# Patient Record
Sex: Female | Born: 1935 | Race: White | Hispanic: No | Smoking: Former smoker
Health system: Southern US, Community
[De-identification: ages and names within clinical notes are randomized; demographics above are authoritative.]

## PROBLEM LIST (undated history)

## (undated) DIAGNOSIS — M869 Osteomyelitis, unspecified: Secondary | ICD-10-CM

## (undated) DIAGNOSIS — J449 Chronic obstructive pulmonary disease, unspecified: Secondary | ICD-10-CM

## (undated) DIAGNOSIS — R918 Other nonspecific abnormal finding of lung field: Secondary | ICD-10-CM

## (undated) DIAGNOSIS — Z8669 Personal history of other diseases of the nervous system and sense organs: Secondary | ICD-10-CM

## (undated) DIAGNOSIS — E538 Deficiency of other specified B group vitamins: Secondary | ICD-10-CM

## (undated) DIAGNOSIS — I10 Essential (primary) hypertension: Secondary | ICD-10-CM

## (undated) DIAGNOSIS — E785 Hyperlipidemia, unspecified: Secondary | ICD-10-CM

## (undated) HISTORY — DX: Essential (primary) hypertension: I10

## (undated) HISTORY — DX: Deficiency of other specified B group vitamins: E53.8

## (undated) HISTORY — DX: Chronic obstructive pulmonary disease, unspecified: J44.9

## (undated) HISTORY — DX: Personal history of other diseases of the nervous system and sense organs: Z86.69

## (undated) HISTORY — PX: ABDOMINAL HYSTERECTOMY: SHX81

## (undated) HISTORY — DX: Hyperlipidemia, unspecified: E78.5

## (undated) HISTORY — DX: Other nonspecific abnormal finding of lung field: R91.8

## (undated) HISTORY — PX: CHOLECYSTECTOMY: SHX55

---

## 1999-06-25 HISTORY — PX: SPINE SURGERY: SHX786

## 2004-05-19 LAB — HM COLONOSCOPY: HM Colonoscopy: NORMAL

## 2008-08-11 ENCOUNTER — Ambulatory Visit: Payer: Self-pay | Admitting: Internal Medicine

## 2009-09-22 DIAGNOSIS — R918 Other nonspecific abnormal finding of lung field: Secondary | ICD-10-CM

## 2009-09-22 HISTORY — DX: Other nonspecific abnormal finding of lung field: R91.8

## 2010-02-12 LAB — HM DIABETES EYE EXAM

## 2010-06-24 HISTORY — PX: LUNG BIOPSY: SHX232

## 2010-09-23 ENCOUNTER — Ambulatory Visit: Payer: Self-pay | Admitting: Internal Medicine

## 2010-10-05 ENCOUNTER — Inpatient Hospital Stay: Payer: Self-pay | Admitting: Internal Medicine

## 2010-10-18 ENCOUNTER — Ambulatory Visit: Payer: Self-pay | Admitting: Cardiothoracic Surgery

## 2010-10-23 ENCOUNTER — Ambulatory Visit: Payer: Self-pay | Admitting: Cardiothoracic Surgery

## 2010-10-23 ENCOUNTER — Ambulatory Visit: Payer: Self-pay | Admitting: Internal Medicine

## 2010-10-24 ENCOUNTER — Ambulatory Visit: Payer: Self-pay | Admitting: Cardiothoracic Surgery

## 2010-11-23 ENCOUNTER — Ambulatory Visit: Payer: Self-pay | Admitting: Cardiothoracic Surgery

## 2011-02-15 ENCOUNTER — Ambulatory Visit: Payer: Self-pay | Admitting: Cardiothoracic Surgery

## 2011-03-06 ENCOUNTER — Other Ambulatory Visit: Payer: Self-pay | Admitting: Internal Medicine

## 2011-03-07 ENCOUNTER — Ambulatory Visit: Payer: Self-pay | Admitting: Cardiothoracic Surgery

## 2011-03-07 MED ORDER — METFORMIN HCL 850 MG PO TABS: 850.0000 mg | ORAL_TABLET | Freq: Two times a day (BID) | ORAL | Status: DC

## 2011-03-07 MED ORDER — ROSUVASTATIN CALCIUM 20 MG PO TABS: 20.0000 mg | ORAL_TABLET | Freq: Every day | ORAL | Status: DC

## 2011-03-07 MED ORDER — LOSARTAN POTASSIUM-HCTZ 100-25 MG PO TABS: 1.0000 | ORAL_TABLET | Freq: Every day | ORAL | Status: DC

## 2011-03-25 ENCOUNTER — Ambulatory Visit: Payer: Self-pay | Admitting: Cardiothoracic Surgery

## 2011-04-12 ENCOUNTER — Other Ambulatory Visit: Payer: Self-pay | Admitting: Internal Medicine

## 2011-04-12 MED ORDER — LISINOPRIL 40 MG PO TABS: 40.0000 mg | ORAL_TABLET | Freq: Every day | ORAL | Status: DC

## 2011-05-13 ENCOUNTER — Ambulatory Visit: Payer: Self-pay | Admitting: Internal Medicine

## 2011-06-06 ENCOUNTER — Ambulatory Visit: Payer: Self-pay | Admitting: Cardiothoracic Surgery

## 2011-06-11 ENCOUNTER — Telehealth: Payer: Self-pay | Admitting: *Deleted

## 2011-06-11 ENCOUNTER — Ambulatory Visit (INDEPENDENT_AMBULATORY_CARE_PROVIDER_SITE_OTHER): Payer: PRIVATE HEALTH INSURANCE | Admitting: Internal Medicine

## 2011-06-11 ENCOUNTER — Encounter: Payer: Self-pay | Admitting: Internal Medicine

## 2011-06-11 VITALS — BP 130/80 | HR 68 | Temp 98.3°F | Ht 64.5 in | Wt 167.5 lb

## 2011-06-11 DIAGNOSIS — R918 Other nonspecific abnormal finding of lung field: Secondary | ICD-10-CM | POA: Insufficient documentation

## 2011-06-11 DIAGNOSIS — R5383 Other fatigue: Secondary | ICD-10-CM

## 2011-06-11 DIAGNOSIS — E1121 Type 2 diabetes mellitus with diabetic nephropathy: Secondary | ICD-10-CM | POA: Insufficient documentation

## 2011-06-11 DIAGNOSIS — I10 Essential (primary) hypertension: Secondary | ICD-10-CM | POA: Insufficient documentation

## 2011-06-11 DIAGNOSIS — R222 Localized swelling, mass and lump, trunk: Secondary | ICD-10-CM

## 2011-06-11 DIAGNOSIS — E119 Type 2 diabetes mellitus without complications: Secondary | ICD-10-CM

## 2011-06-11 DIAGNOSIS — E538 Deficiency of other specified B group vitamins: Secondary | ICD-10-CM | POA: Insufficient documentation

## 2011-06-11 DIAGNOSIS — E1165 Type 2 diabetes mellitus with hyperglycemia: Secondary | ICD-10-CM

## 2011-06-11 DIAGNOSIS — R5381 Other malaise: Secondary | ICD-10-CM

## 2011-06-11 DIAGNOSIS — F17200 Nicotine dependence, unspecified, uncomplicated: Secondary | ICD-10-CM

## 2011-06-11 DIAGNOSIS — J449 Chronic obstructive pulmonary disease, unspecified: Secondary | ICD-10-CM | POA: Insufficient documentation

## 2011-06-11 DIAGNOSIS — R32 Unspecified urinary incontinence: Secondary | ICD-10-CM

## 2011-06-11 DIAGNOSIS — Z87891 Personal history of nicotine dependence: Secondary | ICD-10-CM | POA: Insufficient documentation

## 2011-06-11 DIAGNOSIS — E785 Hyperlipidemia, unspecified: Secondary | ICD-10-CM | POA: Insufficient documentation

## 2011-06-11 DIAGNOSIS — Z23 Encounter for immunization: Secondary | ICD-10-CM

## 2011-06-11 DIAGNOSIS — Z720 Tobacco use: Secondary | ICD-10-CM

## 2011-06-11 DIAGNOSIS — E559 Vitamin D deficiency, unspecified: Secondary | ICD-10-CM

## 2011-06-11 MED ORDER — HYDROCOD POLST-CHLORPHEN POLST 10-8 MG/5ML PO LQCR
5.0000 mL | Freq: Two times a day (BID) | ORAL | Status: DC | PRN
Start: 2011-06-11 — End: 2011-06-12

## 2011-06-11 MED ORDER — DISPOSABLE BRIEF LARGE MISC: 1.0000 "application " | Freq: Two times a day (BID) | Status: DC

## 2011-06-11 MED ORDER — CYANOCOBALAMIN 1000 MCG/ML IJ SOLN
1000.0000 ug | Freq: Once | INTRAMUSCULAR | Status: AC
  Administered 2011-06-11: 1000 ug via INTRAMUSCULAR

## 2011-06-11 NOTE — Patient Instructions (Signed)
We are checking your blood work to see why you are so tired and to see how your diabetes is doing.  We will call you with the results.  Return in 3 months

## 2011-06-11 NOTE — Progress Notes (Signed)
Subjective:    Patient ID: Monica [name], female    DOB: 1930/03/25, 77 y.o.   MRN: [id number]  HPI [name] is a 77 yo white female with a history of DM2, tobacco abuse,  Lung mass with weight loss, hospitalized April 2012 for presumed lung CA with inconclusive biopsy last see June 2012 for hospital follow up.  She presents with fatigue.  She has a history of depression,  B12 deficiency , has not not taking her shots anymore since my office relocated.  She takes metformin and glipizide for diabetes and denies any changes in control, specifically no recent lows.   Her husband diagnosed with bladder cancer several months ago and she has been caring for him including bathing him, waiting on him hand and foot and resenting him for it.  She has had a cough for about two weeks without fevers or productive sputum.     Past Medical History  Diagnosis Date  . Diabetes mellitus   . Hyperlipidemia   . Hypertension   . COPD (chronic obstructive pulmonary disease)   . B12 deficiency   . H/O: Bell's palsy   . Lung mass April 2011    s/p biopsy, no cancer cells found, Repeat CT Dec 2012 unchanged   Current Outpatient Prescriptions on File Prior to Visit  Medication Sig Dispense Refill  . lisinopril (PRINIVIL,ZESTRIL) 40 MG tablet Take 1 tablet (40 mg total) by mouth daily.  30 tablet  3  . losartan-hydrochlorothiazide (HYZAAR) 100-25 MG per tablet Take 1 tablet by mouth daily.  30 tablet  3  . metFORMIN (GLUCOPHAGE) 850 MG tablet Take 1 tablet (850 mg total) by mouth 2 (two) times daily with a meal.  60 tablet  3  . rosuvastatin (CRESTOR) 20 MG tablet Take 1 tablet (20 mg total) by mouth at bedtime.  30 tablet  3   No current facility-administered medications on file prior to visit.    Review of Systems  Constitutional: Negative for fever, chills and unexpected weight change.  HENT: Negative for hearing loss, ear pain, nosebleeds, congestion, sore throat, facial swelling, rhinorrhea, sneezing, mouth  sores, trouble swallowing, neck pain, neck stiffness, voice change, postnasal drip, sinus pressure, tinnitus and ear discharge.   Eyes: Negative for pain, discharge, redness and visual disturbance.  Respiratory: Positive for cough. Negative for chest tightness, shortness of breath, wheezing and stridor.   Cardiovascular: Negative for chest pain, palpitations and leg swelling.  Musculoskeletal: Negative for myalgias and arthralgias.  Skin: Negative for color change and rash.  Neurological: Negative for dizziness, weakness, light-headedness and headaches.  Hematological: Negative for adenopathy.       Objective:   Physical Exam  Constitutional: She is oriented to person, place, and time. She appears well-developed and well-nourished.  HENT:  Mouth/Throat: Oropharynx is clear and moist.  Eyes: EOM are normal. Pupils are equal, round, and reactive to light. No scleral icterus.  Neck: Normal range of motion. Neck supple. No JVD present. No thyromegaly present.  Cardiovascular: Normal rate, regular rhythm, normal heart sounds and intact distal pulses.   Pulmonary/Chest: Effort normal and breath sounds normal.  Abdominal: Soft. Bowel sounds are normal. She exhibits no mass. There is no tenderness.  Musculoskeletal: Normal range of motion. She exhibits no edema.  Lymphadenopathy:    She has no cervical adenopathy.  Neurological: She is alert and oriented to person, place, and time.  Skin: Skin is warm and dry.  Psychiatric: She has a normal mood and affect.  Assessment & Plan:

## 2011-06-11 NOTE — Assessment & Plan Note (Signed)
She has not had lipids or labs since April 2012.  Repeat due, goal LDL 70

## 2011-06-11 NOTE — Assessment & Plan Note (Signed)
She stopped smoking for a few months after her presumed diagnosis of lung CA ut is now smoking again, citing her home stressors as the cause. Counselling and encouragement to quit given.

## 2011-06-11 NOTE — Assessment & Plan Note (Signed)
Over due by 1 year. For eye exam.  Will refer to [facility]

## 2011-06-11 NOTE — Assessment & Plan Note (Signed)
Currently well controlled. No changes today

## 2011-06-11 NOTE — Telephone Encounter (Signed)
Tussionex.  5 ml every 12 hours prn cough     200 ml 1 refill

## 2011-06-11 NOTE — Telephone Encounter (Signed)
Called in RX, pt was informed at OV to check with pharm

## 2011-06-11 NOTE — Assessment & Plan Note (Signed)
With positive PET scan ,  Negative biopsy April 2012.  Has been having serial CT scans, last one  on Dec 13th  by Dr. Genevive Bi,  It was unchanged so she is back to every 6 months.

## 2011-06-11 NOTE — Telephone Encounter (Signed)
Patient requesting RF of cough med given when she was last d/c'd from the hospital. She does not remember name, please advise.

## 2011-06-12 ENCOUNTER — Telehealth: Payer: Self-pay | Admitting: Internal Medicine

## 2011-06-12 LAB — CBC WITH DIFFERENTIAL/PLATELET
Basophils Relative: 0.3 % (ref 0.0–3.0)
Eosinophils Absolute: 0.3 10*3/uL (ref 0.0–0.7)
HCT: 41.6 % (ref 36.0–46.0)
Lymphs Abs: 1.7 10*3/uL (ref 0.7–4.0)
MCHC: 33.1 g/dL (ref 30.0–36.0)
MCV: 86.9 fl (ref 78.0–100.0)
Monocytes Absolute: 0.5 10*3/uL (ref 0.1–1.0)
Neutro Abs: 5.3 10*3/uL (ref 1.4–7.7)
Neutrophils Relative %: 67.9 % (ref 43.0–77.0)
RBC: 4.79 Mil/uL (ref 3.87–5.11)

## 2011-06-12 LAB — FOLATE: Folate: 12.1 ng/mL (ref >5.9)

## 2011-06-12 LAB — COMPREHENSIVE METABOLIC PANEL
AST: 19 U/L (ref 0–37)
Alkaline Phosphatase: 59 U/L (ref 39–117)
BUN: 24 mg/dL — ABNORMAL HIGH (ref 6–23)
Creatinine, Ser: 1.4 mg/dL — ABNORMAL HIGH (ref 0.4–1.2)
Glucose, Bld: 151 mg/dL — ABNORMAL HIGH (ref 70–99)
Potassium: 4.7 mEq/L (ref 3.5–5.1)
Total Bilirubin: 0.6 mg/dL (ref 0.3–1.2)

## 2011-06-12 LAB — VITAMIN B12: Vitamin B-12: 303 pg/mL (ref 211–911)

## 2011-06-12 MED ORDER — HYDROCODONE-ACETAMINOPHEN 5-500 MG PO TABS: 1.0000 | ORAL_TABLET | Freq: Four times a day (QID) | ORAL | Status: DC | PRN

## 2011-06-12 NOTE — Telephone Encounter (Signed)
Patient can't afford to pay 48.00 for cough syrup could you prescribe something else.

## 2011-06-12 NOTE — Telephone Encounter (Signed)
Please call ms Knoles in hydrocodone/apap 5/500 one tablet every 6 hours as needed for cough   #30 no refills to take the place of the cough syrup

## 2011-06-12 NOTE — Telephone Encounter (Signed)
Patient advised as instructed via telephone.  Rx sent to [facility].

## 2011-06-13 DIAGNOSIS — R32 Unspecified urinary incontinence: Secondary | ICD-10-CM | POA: Insufficient documentation

## 2011-06-13 NOTE — Assessment & Plan Note (Signed)
She continues to require adult diapers to manage urge/stress incontinence that did not respond to medical therapy.Marland Kitchen

## 2011-06-13 NOTE — Progress Notes (Signed)
  Subjective:    Patient ID: Monica [name], female    DOB: [birth year], 77 y.o.   MRN: [id number]  HPI    Review of Systems     Objective:   Physical Exam        Assessment & Plan:   Diabetes mellitus type 2, uncontrolled Over due by 1 year. For eye exam.  Will refer to  eye   Lung mass With positive PET scan ,  Negative biopsy April 2012.  Has been having serial CT scans, last one  on Dec 13th  by Dr. Genevive Bi,  It was unchanged so she is back to every 6 months.   Hypertension Currently well controlled. No changes today  Hyperlipidemia She has not had lipids or labs since April 2012.  Repeat due, goal LDL 70  Tobacco abuse disorder She stopped smoking for a few months after her presumed diagnosis of lung CA ut is now smoking again, citing her home stressors as the cause. Counselling and encouragement to quit given.   Incontinence of urine She continues to require adult diapers to manage urge/stress incontinence that did not respond to medical therapy.Marland Kitchen     Updated Medication List Outpatient Encounter Prescriptions as of 06/11/2011  Medication Sig Dispense Refill  . acetaminophen (TYLENOL) 500 MG tablet Take 500 mg by mouth as needed.        Marland Kitchen atenolol (TENORMIN) 50 MG tablet Take 50 mg by mouth 2 (two) times daily.        Marland Kitchen glipiZIDE (GLUCOTROL) 5 MG tablet Take 1/2 by mouth every morning before breakfast and before supper       . lisinopril (PRINIVIL,ZESTRIL) 40 MG tablet Take 1 tablet (40 mg total) by mouth daily.  30 tablet  3  . losartan-hydrochlorothiazide (HYZAAR) 100-25 MG per tablet Take 1 tablet by mouth daily.  30 tablet  3  . metFORMIN (GLUCOPHAGE) 850 MG tablet Take 1 tablet (850 mg total) by mouth 2 (two) times daily with a meal.  60 tablet  3  . rosuvastatin (CRESTOR) 20 MG tablet Take 1 tablet (20 mg total) by mouth at bedtime.  30 tablet  3  . sertraline (ZOLOFT) 50 MG tablet Take 50 mg by mouth daily.        . Incontinence Supply Disposable  (DISPOSABLE BRIEF LARGE) MISC 1 application by Does not apply route 2 (two) times daily.  36 each  12  . cyanocobalamin ((VITAMIN B-12)) injection 1,000 mcg

## 2011-06-25 ENCOUNTER — Ambulatory Visit: Payer: Self-pay | Admitting: Cardiothoracic Surgery

## 2011-06-28 ENCOUNTER — Ambulatory Visit (INDEPENDENT_AMBULATORY_CARE_PROVIDER_SITE_OTHER): Payer: 59 | Admitting: *Deleted

## 2011-06-28 DIAGNOSIS — E538 Deficiency of other specified B group vitamins: Secondary | ICD-10-CM

## 2011-06-28 MED ORDER — CYANOCOBALAMIN 1000 MCG/ML IJ SOLN: 1000.0000 ug | Freq: Once | INTRAMUSCULAR | Status: DC

## 2011-07-01 ENCOUNTER — Encounter: Payer: Self-pay | Admitting: Internal Medicine

## 2011-07-08 ENCOUNTER — Ambulatory Visit (INDEPENDENT_AMBULATORY_CARE_PROVIDER_SITE_OTHER): Payer: 59 | Admitting: Internal Medicine

## 2011-07-08 ENCOUNTER — Encounter: Payer: Self-pay | Admitting: Internal Medicine

## 2011-07-08 VITALS — BP 130/40 | HR 68 | Temp 98.1°F | Wt 163.0 lb

## 2011-07-08 DIAGNOSIS — Z716 Tobacco abuse counseling: Secondary | ICD-10-CM | POA: Insufficient documentation

## 2011-07-08 DIAGNOSIS — J441 Chronic obstructive pulmonary disease with (acute) exacerbation: Secondary | ICD-10-CM

## 2011-07-08 DIAGNOSIS — J40 Bronchitis, not specified as acute or chronic: Secondary | ICD-10-CM

## 2011-07-08 DIAGNOSIS — Z7189 Other specified counseling: Secondary | ICD-10-CM

## 2011-07-08 MED ORDER — AMOXICILLIN-POT CLAVULANATE 875-125 MG PO TABS: 1.0000 | ORAL_TABLET | Freq: Two times a day (BID) | ORAL | Status: AC

## 2011-07-08 MED ORDER — PREDNISONE (PAK) 10 MG PO TABS: ORAL_TABLET | ORAL | Status: AC

## 2011-07-08 NOTE — Patient Instructions (Addendum)
Get generic mucinex (guaifenesin) 600 mg twice daily .  Take with a full glass of water.  Augmentin 875/125  one table  two times daily with food for 7 days ( this is your )  Prednisone taper  :  Take 6 mg on day 1 all at once,  Then one tablet less every day until gone .(this is for wheezing)  Hydrocodone tablet is to be taken at bedtime for cough.

## 2011-07-08 NOTE — Progress Notes (Signed)
Subjective:    Patient ID: Monica [name], female    DOB: 1929-11-13, 78 y.o.   MRN: [id number]  HPI  78 yr old white female with a history of prior necrotic lung mass, negative biopsy,  Presumed secondary to pneumonia, presents with cough and runny nose for one week .  Reports malaise without myalgias or high fevers.  She has a history of  tobacco abuse and diabetes.  Past Medical History  Diagnosis Date  . Diabetes mellitus   . Hyperlipidemia   . Hypertension   . COPD (chronic obstructive pulmonary disease)   . B12 deficiency   . H/O: Bell's palsy   . Lung mass April 2011    s/p biopsy, no cancer cells found, Repeat CT Dec 2012 unchanged    Current Outpatient Prescriptions on File Prior to Visit  Medication Sig Dispense Refill  . acetaminophen (TYLENOL) 500 MG tablet Take 500 mg by mouth as needed.        Marland Kitchen atenolol (TENORMIN) 50 MG tablet Take 50 mg by mouth 2 (two) times daily.        Marland Kitchen glipiZIDE (GLUCOTROL) 5 MG tablet Take 1/2 by mouth every morning before breakfast and before supper       . Incontinence Supply Disposable (DISPOSABLE BRIEF LARGE) MISC 1 application by Does not apply route 2 (two) times daily.  36 each  12  . lisinopril (PRINIVIL,ZESTRIL) 40 MG tablet Take 1 tablet (40 mg total) by mouth daily.  30 tablet  3  . losartan-hydrochlorothiazide (HYZAAR) 100-25 MG per tablet Take 1 tablet by mouth daily.  30 tablet  3  . metFORMIN (GLUCOPHAGE) 850 MG tablet Take 1 tablet (850 mg total) by mouth 2 (two) times daily with a meal.  60 tablet  3  . rosuvastatin (CRESTOR) 20 MG tablet Take 1 tablet (20 mg total) by mouth at bedtime.  30 tablet  3  . sertraline (ZOLOFT) 50 MG tablet Take 50 mg by mouth daily.         Current Facility-Administered Medications on File Prior to Visit  Medication Dose Route Frequency Provider Last Rate Last Dose  . cyanocobalamin ((VITAMIN B-12)) injection 1,000 mcg  1,000 mcg Intramuscular Once Deborra Medina, MD       Review of Systems    Constitutional: Positive for fever and fatigue. Negative for chills and unexpected weight change.  HENT: Negative for hearing loss, ear pain, nosebleeds, congestion, sore throat, facial swelling, rhinorrhea, sneezing, mouth sores, trouble swallowing, neck pain, neck stiffness, voice change, postnasal drip, sinus pressure, tinnitus and ear discharge.   Eyes: Negative for pain, discharge, redness and visual disturbance.  Respiratory: Positive for cough and wheezing. Negative for chest tightness, shortness of breath and stridor.   Cardiovascular: Negative for chest pain, palpitations and leg swelling.  Musculoskeletal: Negative for myalgias and arthralgias.  Skin: Negative for color change and rash.  Neurological: Negative for dizziness, weakness, light-headedness and headaches.  Hematological: Negative for adenopathy.       Objective:   Physical Exam  Constitutional: She is oriented to person, place, and time. She appears well-developed and well-nourished.  HENT:  Mouth/Throat: Oropharynx is clear and moist.  Eyes: EOM are normal. Pupils are equal, round, and reactive to light. No scleral icterus.  Neck: Normal range of motion. Neck supple. No JVD present. No thyromegaly present.  Cardiovascular: Normal rate, regular rhythm, normal heart sounds and intact distal pulses.   Pulmonary/Chest: Effort normal. She has wheezes.  Abdominal: Soft. Bowel sounds are normal.  She exhibits no mass. There is no tenderness.  Musculoskeletal: Normal range of motion. She exhibits no edema.  Lymphadenopathy:    She has no cervical adenopathy.  Neurological: She is alert and oriented to person, place, and time.  Skin: Skin is warm and dry.  Psychiatric: She has a normal mood and affect.      Assessment & Plan:

## 2011-07-08 NOTE — Assessment & Plan Note (Signed)
Spent less than 3 minutes reminding patient of her close brush with cancer last year and advised  Her to quit.

## 2011-07-08 NOTE — Assessment & Plan Note (Signed)
Mild, with minimal wheezing on exam,  Steroids,  abx,  Return in one week if no improvement for cxr

## 2011-07-12 ENCOUNTER — Telehealth: Payer: Self-pay | Admitting: Internal Medicine

## 2011-07-12 NOTE — Telephone Encounter (Signed)
Advised pt

## 2011-07-12 NOTE — Telephone Encounter (Signed)
More likely related to the illness she is recovering from.  Tell her to rest

## 2011-07-12 NOTE — Telephone Encounter (Signed)
Patient picking up a few things around the house and broke out in a sweat ,wandering if it has anything to do with the new medication she it taking.

## 2011-07-22 ENCOUNTER — Other Ambulatory Visit: Payer: Self-pay | Admitting: Internal Medicine

## 2011-07-30 ENCOUNTER — Ambulatory Visit (INDEPENDENT_AMBULATORY_CARE_PROVIDER_SITE_OTHER): Payer: 59 | Admitting: *Deleted

## 2011-07-30 DIAGNOSIS — E538 Deficiency of other specified B group vitamins: Secondary | ICD-10-CM

## 2011-07-30 MED ORDER — CYANOCOBALAMIN 1000 MCG/ML IJ SOLN
1000.0000 ug | Freq: Once | INTRAMUSCULAR | Status: AC
  Administered 2013-10-18: 1000 ug via INTRAMUSCULAR

## 2011-08-19 ENCOUNTER — Other Ambulatory Visit: Payer: Self-pay | Admitting: Internal Medicine

## 2011-08-28 ENCOUNTER — Encounter: Payer: Self-pay | Admitting: Internal Medicine

## 2011-09-11 ENCOUNTER — Ambulatory Visit: Payer: PRIVATE HEALTH INSURANCE | Admitting: Internal Medicine

## 2011-09-12 ENCOUNTER — Other Ambulatory Visit: Payer: Self-pay | Admitting: Internal Medicine

## 2011-09-17 ENCOUNTER — Encounter: Payer: Self-pay | Admitting: Internal Medicine

## 2011-09-17 ENCOUNTER — Ambulatory Visit (INDEPENDENT_AMBULATORY_CARE_PROVIDER_SITE_OTHER): Payer: 59 | Admitting: Internal Medicine

## 2011-09-17 VITALS — BP 150/74 | HR 72 | Temp 98.1°F | Resp 18 | Wt 171.5 lb

## 2011-09-17 DIAGNOSIS — E1165 Type 2 diabetes mellitus with hyperglycemia: Secondary | ICD-10-CM

## 2011-09-17 DIAGNOSIS — G8929 Other chronic pain: Secondary | ICD-10-CM

## 2011-09-17 DIAGNOSIS — E538 Deficiency of other specified B group vitamins: Secondary | ICD-10-CM

## 2011-09-17 DIAGNOSIS — Z7189 Other specified counseling: Secondary | ICD-10-CM

## 2011-09-17 DIAGNOSIS — E119 Type 2 diabetes mellitus without complications: Secondary | ICD-10-CM

## 2011-09-17 DIAGNOSIS — IMO0001 Reserved for inherently not codable concepts without codable children: Secondary | ICD-10-CM

## 2011-09-17 DIAGNOSIS — Z716 Tobacco abuse counseling: Secondary | ICD-10-CM

## 2011-09-17 DIAGNOSIS — M25561 Pain in right knee: Secondary | ICD-10-CM

## 2011-09-17 DIAGNOSIS — M25562 Pain in left knee: Secondary | ICD-10-CM

## 2011-09-17 DIAGNOSIS — M25569 Pain in unspecified knee: Secondary | ICD-10-CM

## 2011-09-17 MED ORDER — ROSUVASTATIN CALCIUM 20 MG PO TABS: 20.0000 mg | ORAL_TABLET | Freq: Every day | ORAL | Status: DC

## 2011-09-17 MED ORDER — LOSARTAN POTASSIUM-HCTZ 100-25 MG PO TABS: 1.0000 | ORAL_TABLET | Freq: Every day | ORAL | Status: DC

## 2011-09-17 MED ORDER — LISINOPRIL 40 MG PO TABS: 40.0000 mg | ORAL_TABLET | Freq: Every day | ORAL | Status: DC

## 2011-09-17 MED ORDER — SERTRALINE HCL 50 MG PO TABS: 50.0000 mg | ORAL_TABLET | Freq: Every day | ORAL | Status: DC

## 2011-09-17 MED ORDER — TRAMADOL HCL 50 MG PO TABS: 50.0000 mg | ORAL_TABLET | Freq: Three times a day (TID) | ORAL | Status: AC | PRN

## 2011-09-17 NOTE — Assessment & Plan Note (Signed)
Shot given today

## 2011-09-17 NOTE — Patient Instructions (Addendum)
I am prescribing a tramadol a pain medication ., for your knees,  Take up to three times daily   You can add ibuprofen 400 mg twice daily  And tylenol 1000 mg twice daily  If needed for pain control.    Please go get x rays of your knees soon.  Check your blood sugars once a day at different times adn keep a diary of the time and whether you had eaten or not before you took it   Send diary in 2 weeks to me or call my nurse.

## 2011-09-17 NOTE — Progress Notes (Signed)
Patient ID: Monica [name], female   DOB: [birth year], 78 y.o.   MRN: [id number]   Patient Active Problem List  Diagnoses  . Diabetes mellitus type 2, uncontrolled  . Diabetes mellitus  . Hyperlipidemia  . Hypertension  . COPD (chronic obstructive pulmonary disease)  . B12 deficiency  . Lung mass  . Tobacco abuse disorder  . Incontinence of urine  . COPD exacerbation  . Tobacco abuse counseling  . Bilateral chronic knee pain    Subjective:  CC:   Chief Complaint  Patient presents with  . Follow-up    HPI:   Monica [name] a 78 y.o. female who presents with bilateral knee pain which has been for the last 2-3 years but has become more persistent lately and is now preventing her from doing housework housework without moderate pain .  She has no history of trauma or prior surgeries. She notes that her pain increases after inactivity and describes the pain as aching and stiffness she has noticed some bilateral swelling but no erythema.       Past Medical History  Diagnosis Date  . Diabetes mellitus   . Hyperlipidemia   . Hypertension   . COPD (chronic obstructive pulmonary disease)   . B12 deficiency   . H/O: Bell's palsy   . Lung mass April 2011    s/p biopsy, no cancer cells found, Repeat CT Dec 2012 unchanged    Past Surgical History  Procedure Date  . Cholecystectomy   . Spine surgery 2001    Lumbar  . Abdominal hysterectomy   . Lung biopsy 2012    for positive PET, negative biopsy for malignancy       . cyanocobalamin  1,000 mcg Intramuscular Once  . cyanocobalamin  1,000 mcg Intramuscular Once     The following portions of the patient's history were reviewed and updated as appropriate: Allergies, current medications, and problem list.    Review of Systems:   12 Pt  review of systems was negative except those addressed in the HPI,     History   Social History  . Marital Status: Married    Spouse Name: N/A    Number of Children: N/A  . Years of  Education: N/A   Occupational History  . Not on file.   Social History Main Topics  . Smoking status: Current Everyday Smoker -- 0.4 packs/day for 5 years    Types: Cigarettes  . Smokeless tobacco: Never Used  . Alcohol Use: No  . Drug Use: No  . Sexually Active: Not on file   Other Topics Concern  . Not on file   Social History Narrative  . No narrative on file    Objective:  BP 150/74  Pulse 72  Temp(Src) 98.1 F (36.7 C) (Oral)  Resp 18  Wt 171 lb 8 oz (77.792 kg)  SpO2 99%  General appearance: alert, cooperative and appears stated age Ears: normal TM's and external ear canals both ears Throat: lips, mucosa, and tongue normal; teeth and gums normal Neck: no adenopathy, no carotid bruit, supple, symmetrical, trachea midline and thyroid not enlarged, symmetric, no tenderness/mass/nodules Back: symmetric, no curvature. ROM normal. No CVA tenderness. Lungs: clear to auscultation bilaterally Heart: regular rate and rhythm, S1, S2 normal, no murmur, click, rub or gallop Abdomen: soft, non-tender; bowel sounds normal; no masses,  no organomegaly Pulses: 2+ and symmetric Skin: Skin color, texture, turgor normal. No rashes or lesions MSK: crepitus, swelling bilaterally.  No effusions.  Negative drawer sign  Lymph nodes: Cervical, supraclavicular, and axillary nodes normal.  Assessment and Plan:  B12 deficiency Shot given today  Bilateral chronic knee pain Exam is notable for crepitus and swelling bilaterally with no signs of effusion. I suspect she has 3 compartment DJD and have recommended that she get plain films. She does no physical therapy at this time. Her risk factors for surgery include tobacco use and diabetes. However she has normal renal function.  Tobacco abuse counseling Despite her near diagnosis of lung cancer several years ago during a hospitalization for pneumonia, she has assumed daily tobacco use. Counseling given today is not interested in  pharmacotherapy. I have again outlined the risks of current use. I recommend that she decrease her consumption by one cigarette per week.  Diabetes mellitus type 2, uncontrolled She has lost some control of her diabetes as her current hemoglobin A1c is 8.0. She has no signs of diabetic nephropathy. I have asked her to keep a log of her sugars and bring back or send it to [place] every 2 weeks so we can adjust her medications. From under her diabetic annual exam it has been given. 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    Updated Medication List Outpatient Encounter Prescriptions as of 09/17/2011  Medication Sig Dispense Refill  . acetaminophen (TYLENOL) 500 MG tablet Take 500 mg by mouth as needed.        Marland Kitchen atenolol (TENORMIN) 50 MG tablet Take 50 mg by mouth 2 (two) times daily.        Marland Kitchen CLEVER CHEK AUTO-CODE VOICE test strip USE 1 TIME DAILY FOR DIABETIC TESTING  100 each  5  . glipiZIDE (GLUCOTROL) 5 MG tablet Take 1/2 by mouth every morning before breakfast and before supper       . Incontinence Supply Disposable (DISPOSABLE BRIEF LARGE) MISC 1 application by Does not apply route 2 (two) times daily.  36 each  12  . Lancets MISC USE 1 TIME DAILY FOR DIABETIC TESTING  100 each  5  . lisinopril (PRINIVIL,ZESTRIL) 40 MG tablet Take 1 tablet (40 mg total) by mouth daily.  30 tablet  5  . losartan-hydrochlorothiazide (HYZAAR) 100-25 MG per tablet Take 1 tablet by mouth daily.  30 tablet  5  . metFORMIN (GLUCOPHAGE) 850 MG tablet TAKE ONE TABLET TWICE A DAY WITH FOOD  60 tablet  3  . rosuvastatin (CRESTOR) 20 MG tablet Take 1 tablet (20 mg total) by mouth daily.  30 tablet  5  . sertraline (ZOLOFT) 50 MG tablet Take 1 tablet (50 mg total) by mouth daily.  30 tablet  5  . DISCONTD: CRESTOR 20 MG tablet TAKE ONE TABLET DAILY AT BEDTIME  30 tablet  5  . DISCONTD: lisinopril (PRINIVIL,ZESTRIL) 40 MG tablet Take 1 tablet (40 mg total) by mouth daily.  30  tablet  3  . DISCONTD: losartan-hydrochlorothiazide (HYZAAR) 100-25 MG per tablet TAKE ONE (1) TABLET EACH DAY  30 tablet  5  . DISCONTD: sertraline (ZOLOFT) 50 MG tablet Take 50 mg by mouth daily.        . traMADol (ULTRAM) 50 MG tablet Take 1 tablet (50 mg total) by mouth every 8 (eight) hours as needed for pain.  30 tablet  0   Facility-Administered Encounter Medications as of 09/17/2011  Medication Dose Route Frequency Provider Last Rate Last Dose  . cyanocobalamin ((VITAMIN B-12)) injection 1,000 mcg  1,000 mcg Intramuscular Once [name],  MD      . cyanocobalamin ((VITAMIN B-12)) injection 1,000 mcg  1,000 mcg Intramuscular Once [name], MD      . cyanocobalamin ((VITAMIN B-12)) injection 1,000 mcg  1,000 mcg Intramuscular Once [name], MD   1,000 mcg at 09/18/11 [id number]     Orders Placed This Encounter  Procedures  . Direct LDL  . COMPLETE METABOLIC PANEL WITH GFR  . Microalbumin / creatinine urine ratio  . Hemoglobin A1c    Return in about 3 months (around 12/18/2011).

## 2011-09-18 LAB — COMPLETE METABOLIC PANEL WITH GFR
AST: 17 U/L (ref 0–37)
Alkaline Phosphatase: 52 U/L (ref 39–117)
BUN: 22 mg/dL (ref 6–23)
Calcium: 9.6 mg/dL (ref 8.4–10.5)
Creat: 1.07 mg/dL (ref 0.50–1.10)
Total Bilirubin: 0.4 mg/dL (ref 0.3–1.2)

## 2011-09-18 LAB — MICROALBUMIN / CREATININE URINE RATIO
Creatinine,U: 76.5 mg/dL
Microalb Creat Ratio: 0.9 mg/g (ref 0.0–30.0)
Microalb, Ur: 0.7 mg/dL (ref 0.0–1.9)

## 2011-09-18 LAB — LDL CHOLESTEROL, DIRECT: Direct LDL: 59.1 mg/dL

## 2011-09-18 MED ORDER — CYANOCOBALAMIN 1000 MCG/ML IJ SOLN
1000.0000 ug | Freq: Once | INTRAMUSCULAR | Status: AC
  Administered 2011-09-18: 1000 ug via INTRAMUSCULAR

## 2011-09-21 ENCOUNTER — Encounter: Payer: Self-pay | Admitting: Internal Medicine

## 2011-09-21 DIAGNOSIS — M25561 Pain in right knee: Secondary | ICD-10-CM | POA: Insufficient documentation

## 2011-09-21 NOTE — Assessment & Plan Note (Signed)
She has lost some control of her diabetes as her current hemoglobin A1c is 8.0. She has no signs of diabetic nephropathy. I have asked her to keep a log of her sugars and bring back or send it to [place] every 2 weeks so we can adjust her medications. From under her diabetic annual exam it has been given. 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

## 2011-09-21 NOTE — Assessment & Plan Note (Signed)
Despite her near diagnosis of lung cancer several years ago during a hospitalization for pneumonia, she has assumed daily tobacco use. Counseling given today is not interested in pharmacotherapy. I have again outlined the risks of current use. I recommend that she decrease her consumption by one cigarette per week.

## 2011-09-21 NOTE — Assessment & Plan Note (Signed)
Exam is notable for crepitus and swelling bilaterally with no signs of effusion. I suspect she has 3 compartment DJD and have recommended that she get plain films. She does no physical therapy at this time. Her risk factors for surgery include tobacco use and diabetes. However she has normal renal function.

## 2011-10-16 ENCOUNTER — Other Ambulatory Visit: Payer: Self-pay | Admitting: Internal Medicine

## 2011-10-16 MED ORDER — ATENOLOL 50 MG PO TABS: 50.0000 mg | ORAL_TABLET | Freq: Two times a day (BID) | ORAL | Status: DC

## 2011-11-23 ENCOUNTER — Other Ambulatory Visit: Payer: Self-pay | Admitting: Internal Medicine

## 2011-12-05 ENCOUNTER — Ambulatory Visit: Payer: Self-pay | Admitting: Cardiothoracic Surgery

## 2011-12-12 ENCOUNTER — Other Ambulatory Visit: Payer: Self-pay | Admitting: Internal Medicine

## 2011-12-12 MED ORDER — GLIPIZIDE 5 MG PO TABS: 5.0000 mg | ORAL_TABLET | Freq: Two times a day (BID) | ORAL | Status: DC

## 2011-12-13 ENCOUNTER — Other Ambulatory Visit: Payer: Self-pay | Admitting: Internal Medicine

## 2011-12-13 MED ORDER — GLIPIZIDE 5 MG PO TABS: 2.5000 mg | ORAL_TABLET | Freq: Two times a day (BID) | ORAL | Status: DC

## 2011-12-18 ENCOUNTER — Ambulatory Visit: Payer: 59 | Admitting: Internal Medicine

## 2011-12-23 ENCOUNTER — Ambulatory Visit: Payer: Self-pay | Admitting: Cardiothoracic Surgery

## 2012-02-14 ENCOUNTER — Ambulatory Visit (INDEPENDENT_AMBULATORY_CARE_PROVIDER_SITE_OTHER): Payer: 59 | Admitting: Internal Medicine

## 2012-02-14 ENCOUNTER — Encounter: Payer: Self-pay | Admitting: Internal Medicine

## 2012-02-14 ENCOUNTER — Ambulatory Visit: Payer: 59 | Admitting: Internal Medicine

## 2012-02-14 VITALS — BP 132/66 | HR 70 | Temp 98.8°F | Resp 16 | Wt 171.2 lb

## 2012-02-14 DIAGNOSIS — Z1239 Encounter for other screening for malignant neoplasm of breast: Secondary | ICD-10-CM

## 2012-02-14 DIAGNOSIS — R918 Other nonspecific abnormal finding of lung field: Secondary | ICD-10-CM

## 2012-02-14 DIAGNOSIS — E1165 Type 2 diabetes mellitus with hyperglycemia: Secondary | ICD-10-CM

## 2012-02-14 DIAGNOSIS — J449 Chronic obstructive pulmonary disease, unspecified: Secondary | ICD-10-CM

## 2012-02-14 DIAGNOSIS — R222 Localized swelling, mass and lump, trunk: Secondary | ICD-10-CM

## 2012-02-14 DIAGNOSIS — I10 Essential (primary) hypertension: Secondary | ICD-10-CM

## 2012-02-14 NOTE — Progress Notes (Signed)
Patient ID: Monica [name], female   DOB: 20-Apr-1930, 78 y.o.   MRN: [id number]  Patient Active Problem List  Diagnosis  . Diabetes mellitus type 2, uncontrolled  . Hyperlipidemia  . Hypertension  . COPD (chronic obstructive pulmonary disease)  . B12 deficiency  . Lung mass  . Tobacco abuse disorder  . Incontinence of urine  . COPD exacerbation  . Tobacco abuse counseling  . Bilateral chronic knee pain    Subjective:  CC:   Chief Complaint  Patient presents with  . Follow-up    HPI:    Monica [name] a 78 y.o. female who presents  For follow up on diabetes .  Last a1c was 8.0; Her  cbgs have been under 150 for the last several weeks,  Had a low recently of 56.  Did not feel bad but felt diaphoretic and tremulous.  Had not eaten much that day.  Drank Pepsi to correct., Not following a low glycemic index diet.   Past Medical History  Diagnosis Date  . Diabetes mellitus   . Hyperlipidemia   . Hypertension   . COPD (chronic obstructive pulmonary disease)   . B12 deficiency   . H/O: Bell's palsy   . Lung mass April 2011    s/p biopsy, no cancer cells found, Repeat CT Dec 2012 unchanged    Past Surgical History  Procedure Date  . Cholecystectomy   . Spine surgery 2001    Lumbar  . Abdominal hysterectomy   . Lung biopsy 2012    for positive PET, negative biopsy for malignancy       . cyanocobalamin  1,000 mcg Intramuscular Once     The following portions of the patient's history were reviewed and updated as appropriate: Allergies, current medications, and problem list.    Review of Systems:   12 Pt  review of systems was negative except those addressed in the HPI,     History   Social History  . Marital Status: Married    Spouse Name: N/A    Number of Children: N/A  . Years of Education: N/A   Occupational History  . Not on file.   Social History Main Topics  . Smoking status: Current Everyday Smoker -- 0.4 packs/day for 5 years    Types: Cigarettes   . Smokeless tobacco: Never Used  . Alcohol Use: No  . Drug Use: No  . Sexually Active: Not on file   Other Topics Concern  . Not on file   Social History Narrative  . No narrative on file    Objective:  BP 132/66  Pulse 70  Temp 98.8 F (37.1 C) (Oral)  Resp 16  Wt 171 lb 4 oz (77.678 kg)  SpO2 97%  General appearance: alert, cooperative and appears stated age Ears: normal TM's and external ear canals both ears Throat: lips, mucosa, and tongue normal; teeth and gums normal Neck: no adenopathy, no carotid bruit, supple, symmetrical, trachea midline and thyroid not enlarged, symmetric, no tenderness/mass/nodules Back: symmetric, no curvature. ROM normal. No CVA tenderness. Lungs: clear to auscultation bilaterally Heart: regular rate and rhythm, S1, S2 normal, no murmur, click, rub or gallop Abdomen: soft, non-tender; bowel sounds normal; no masses,  no organomegaly Pulses: 2+ and symmetric Skin: Skin color, texture, turgor normal. No rashes or lesions Lymph nodes: Cervical, supraclavicular, and axillary nodes normal.  Assessment and Plan:  Diabetes mellitus type 2, uncontrolled She will return for repeat labs and modification of regimen if < 7.0 hgba1c.  ontinue asa , statin   Hypertension Well controlled on current regimen.  Recheck  Renal function , no changes today.  COPD (chronic obstructive pulmonary disease) She has resumed smoking, despite having a brush with cancer.  Cessation encouraged.   Lung mass Follow up was done by Dr. Genevive Bi who did not see any suspicious changes.  Repeat in 6 months    Updated Medication List Outpatient Encounter Prescriptions as of 02/14/2012  Medication Sig Dispense Refill  . acetaminophen (TYLENOL) 500 MG tablet Take 500 mg by mouth as needed.        Marland Kitchen atenolol (TENORMIN) 50 MG tablet Take 1 tablet (50 mg total) by mouth 2 (two) times daily.  60 tablet  3  . CLEVER CHEK AUTO-CODE VOICE test strip USE 1 TIME DAILY FOR DIABETIC  TESTING  100 each  5  . glipiZIDE (GLUCOTROL) 5 MG tablet Take 0.5 tablets (2.5 mg total) by mouth 2 (two) times daily before a meal.  30 tablet  3  . Incontinence Supply Disposable (DISPOSABLE BRIEF LARGE) MISC 1 application by Does not apply route 2 (two) times daily.  36 each  12  . Lancets MISC USE 1 TIME DAILY FOR DIABETIC TESTING  100 each  5  . lisinopril (PRINIVIL,ZESTRIL) 40 MG tablet Take 1 tablet (40 mg total) by mouth daily.  30 tablet  5  . losartan-hydrochlorothiazide (HYZAAR) 100-25 MG per tablet Take 1 tablet by mouth daily.  30 tablet  5  . metFORMIN (GLUCOPHAGE) 850 MG tablet TAKE ONE (1) TABLET BY MOUTH TWO (2)    TIMES DAILY WITH FOOD  60 tablet  3  . rosuvastatin (CRESTOR) 20 MG tablet Take 1 tablet (20 mg total) by mouth daily.  30 tablet  5  . sertraline (ZOLOFT) 50 MG tablet Take 1 tablet (50 mg total) by mouth daily.  30 tablet  5   Facility-Administered Encounter Medications as of 02/14/2012  Medication Dose Route Frequency Provider Last Rate Last Dose  . cyanocobalamin ((VITAMIN B-12)) injection 1,000 mcg  1,000 mcg Intramuscular Once [name], MD      . DISCONTD: cyanocobalamin ((VITAMIN B-12)) injection 1,000 mcg  1,000 mcg Intramuscular Once [name], MD         Orders Placed This Encounter  Procedures  . MM Digital Screening    No Follow-up on file.

## 2012-02-14 NOTE — Patient Instructions (Addendum)
Try the Lennie Hummer n Fit Greek Yogurt  When you feel like skipping lunch  Cut back to one biscuit breakfast    Arnold's Sandwich Thins as a english muffin substitute or to make sandwiches (these are lower in carbs)   Remember bananas have a lot of sugar in them. Same as watermelon and grapes.  Canteloupe is better  Bluberries, strawberries,  Cherries are best.   Return for fasting labs next Tuesday or later so I can check your diabetes.

## 2012-02-15 NOTE — Assessment & Plan Note (Signed)
Follow up was done by Dr. Genevive Bi who did not see any suspicious changes.  Repeat in 6 months

## 2012-02-15 NOTE — Assessment & Plan Note (Signed)
She will return for repeat labs and modification of regimen if < 7.0 hgba1c.  ontinue asa , statin

## 2012-02-15 NOTE — Assessment & Plan Note (Signed)
Well controlled on current regimen.  Recheck  Renal function , no changes today.

## 2012-02-15 NOTE — Assessment & Plan Note (Signed)
She has resumed smoking, despite having a brush with cancer.  Cessation encouraged.

## 2012-02-18 ENCOUNTER — Telehealth: Payer: Self-pay | Admitting: *Deleted

## 2012-02-18 ENCOUNTER — Other Ambulatory Visit (INDEPENDENT_AMBULATORY_CARE_PROVIDER_SITE_OTHER): Payer: 59

## 2012-02-18 DIAGNOSIS — E785 Hyperlipidemia, unspecified: Secondary | ICD-10-CM

## 2012-02-18 DIAGNOSIS — I10 Essential (primary) hypertension: Secondary | ICD-10-CM

## 2012-02-18 DIAGNOSIS — E119 Type 2 diabetes mellitus without complications: Secondary | ICD-10-CM

## 2012-02-18 LAB — COMPREHENSIVE METABOLIC PANEL
ALT: 13 U/L (ref 0–35)
AST: 17 U/L (ref 0–37)
Albumin: 3.7 g/dL (ref 3.5–5.2)
Calcium: 9.7 mg/dL (ref 8.4–10.5)
Chloride: 107 mEq/L (ref 96–112)
Potassium: 4.5 mEq/L (ref 3.5–5.1)

## 2012-02-18 LAB — CBC WITH DIFFERENTIAL/PLATELET
Basophils Relative: 0.7 % (ref 0.0–3.0)
Eosinophils Absolute: 0.2 10*3/uL (ref 0.0–0.7)
MCHC: 32.4 g/dL (ref 30.0–36.0)
MCV: 89.5 fl (ref 78.0–100.0)
Monocytes Absolute: 0.5 10*3/uL (ref 0.1–1.0)
Neutrophils Relative %: 59.9 % (ref 43.0–77.0)
RBC: 4.82 Mil/uL (ref 3.87–5.11)

## 2012-02-18 LAB — LIPID PANEL
Cholesterol: 121 mg/dL (ref 0–200)
LDL Cholesterol: 43 mg/dL (ref 0–99)
Total CHOL/HDL Ratio: 2

## 2012-02-18 NOTE — Telephone Encounter (Signed)
Disregard this note. The phlebotomist found the note under the lab visit. A microalbumin was not collected. We will collect at next visit.

## 2012-02-18 NOTE — Telephone Encounter (Signed)
Patient came in for labs today, but there were no orders. Please let me know what you would like ordered?

## 2012-02-28 NOTE — Progress Notes (Signed)
Quick Note:  All labs normal,. Including thyroid function and cholesterol . Diabetes control is a little better, hgba1c is improved at 7.6 , Goal is < 7.0 Please have her check sugars once daily at various times and send to me prior to next appt.   ______

## 2012-03-04 ENCOUNTER — Telehealth: Payer: Self-pay | Admitting: Internal Medicine

## 2012-03-04 NOTE — Telephone Encounter (Signed)
Pt called wanting to get something called in for her knee pain. It started hurting last night  She is taking tyneol  [facility]

## 2012-03-05 MED ORDER — TRAMADOL HCL 50 MG PO TABS: 50.0000 mg | ORAL_TABLET | Freq: Three times a day (TID) | ORAL | Status: AC | PRN

## 2012-03-05 NOTE — Telephone Encounter (Signed)
Tramadol called in.  Continue tylenol as well.  Every 8 hrs,  Make appt for next week if no better.

## 2012-03-05 NOTE — Telephone Encounter (Signed)
Patient notified

## 2012-03-12 ENCOUNTER — Encounter: Payer: Self-pay | Admitting: Internal Medicine

## 2012-03-22 ENCOUNTER — Emergency Department: Payer: Self-pay | Admitting: Emergency Medicine

## 2012-03-22 LAB — CBC WITH DIFFERENTIAL/PLATELET
Basophil #: 0.1 10*3/uL (ref 0.0–0.1)
Basophil %: 0.7 %
Eosinophil #: 0 10*3/uL (ref 0.0–0.7)
Eosinophil %: 0 %
HGB: 14.6 g/dL (ref 12.0–16.0)
Lymphocyte #: 0.9 10*3/uL — ABNORMAL LOW (ref 1.0–3.6)
Lymphocyte %: 10.3 %
MCH: 30.3 pg (ref 26.0–34.0)
MCHC: 34.7 g/dL (ref 32.0–36.0)
MCV: 87 fL (ref 80–100)
Monocyte #: 1 x10 3/mm — ABNORMAL HIGH (ref 0.2–0.9)
Monocyte %: 10.9 %
Neutrophil #: 6.9 10*3/uL — ABNORMAL HIGH (ref 1.4–6.5)
Neutrophil %: 78.1 %
RBC: 4.81 10*6/uL (ref 3.80–5.20)
RDW: 14.9 % — ABNORMAL HIGH (ref 11.5–14.5)
WBC: 8.8 10*3/uL (ref 3.6–11.0)

## 2012-03-22 LAB — COMPREHENSIVE METABOLIC PANEL
Albumin: 3.1 g/dL — ABNORMAL LOW (ref 3.4–5.0)
Anion Gap: 9 (ref 7–16)
BUN: 13 mg/dL (ref 7–18)
Bilirubin,Total: 0.6 mg/dL (ref 0.2–1.0)
Chloride: 103 mmol/L (ref 98–107)
EGFR (Non-African Amer.): 56 — ABNORMAL LOW
Glucose: 153 mg/dL — ABNORMAL HIGH (ref 65–99)
Osmolality: 281 (ref 275–301)
Potassium: 3.7 mmol/L (ref 3.5–5.1)
Sodium: 139 mmol/L (ref 136–145)
Total Protein: 7.3 g/dL (ref 6.4–8.2)

## 2012-03-22 LAB — SEDIMENTATION RATE: Erythrocyte Sed Rate: 19 mm/hr (ref 0–30)

## 2012-03-22 LAB — TROPONIN I: Troponin-I: <0.02 ng/mL

## 2012-03-23 LAB — URINALYSIS, COMPLETE
Bacteria: NONE SEEN
Bilirubin,UR: NEGATIVE
Glucose,UR: NEGATIVE mg/dL (ref 0–75)
Ketone: NEGATIVE
Leukocyte Esterase: NEGATIVE
Nitrite: NEGATIVE
Ph: 5 (ref 4.5–8.0)
RBC,UR: 3 /HPF (ref 0–5)
Specific Gravity: 1.006 (ref 1.003–1.030)
Squamous Epithelial: 1

## 2012-03-26 ENCOUNTER — Telehealth: Payer: Self-pay | Admitting: Internal Medicine

## 2012-03-26 NOTE — Telephone Encounter (Signed)
Patient went to [facility] on 03-22-12 they thought she might have menegitis since she couldn't walk and had a stiff neck. They did a CT scan and echo cardiogram and they gave her oxycodine 20 pills that she had to take 4 times a day for pain.  She wasn't sure if you were aware of this hospital visit they didn't keep her overnight.

## 2012-03-27 NOTE — Telephone Encounter (Signed)
Please offer her an appt if she needs. it

## 2012-03-30 NOTE — Telephone Encounter (Signed)
Spoke to patient she stated that she is ok advised patient that if she gets worse to call and schedule appointment.

## 2012-04-17 ENCOUNTER — Telehealth: Payer: Self-pay | Admitting: Internal Medicine

## 2012-04-17 NOTE — Telephone Encounter (Signed)
Refill request for atenolol (tenormin) 50 mg  Sig: take 1 tablet by mouth 2 times a day

## 2012-04-21 ENCOUNTER — Other Ambulatory Visit: Payer: Self-pay

## 2012-04-21 MED ORDER — ATENOLOL 50 MG PO TABS: 50.0000 mg | ORAL_TABLET | Freq: Two times a day (BID) | ORAL | Status: DC

## 2012-04-21 NOTE — Telephone Encounter (Signed)
Atenolol 50 mg # 60 3 R sent electronic to [facility].

## 2012-04-21 NOTE — Telephone Encounter (Signed)
Refill request for atenolol 50 mg 3 60 3 R  sent to [facility]. Patient has scheduled appt for 05/26/12 for CPE.

## 2012-04-21 NOTE — Telephone Encounter (Signed)
2nd request for atenolol (tenormin) 50 mg tablet

## 2012-05-11 ENCOUNTER — Other Ambulatory Visit: Payer: Self-pay

## 2012-05-11 MED ORDER — LISINOPRIL 40 MG PO TABS: 40.0000 mg | ORAL_TABLET | Freq: Every day | ORAL | Status: DC

## 2012-05-11 NOTE — Telephone Encounter (Signed)
Refill request for Lisinopril 40 mg # 30 5 R sent electronic to [facility]

## 2012-05-26 ENCOUNTER — Encounter: Payer: 59 | Admitting: Internal Medicine

## 2012-06-08 ENCOUNTER — Ambulatory Visit: Payer: Self-pay | Admitting: Cardiothoracic Surgery

## 2012-06-09 ENCOUNTER — Other Ambulatory Visit: Payer: Self-pay | Admitting: Internal Medicine

## 2012-06-09 MED ORDER — METFORMIN HCL 850 MG PO TABS: 850.0000 mg | ORAL_TABLET | Freq: Two times a day (BID) | ORAL | Status: DC

## 2012-06-09 NOTE — Telephone Encounter (Signed)
Metformin 850 mg # 60 3 R sent to [facility]

## 2012-06-09 NOTE — Telephone Encounter (Signed)
Metformin HCL 850 mg tab Take one tablet by mouth two times daily with food #60

## 2012-06-29 ENCOUNTER — Encounter: Payer: 59 | Admitting: Internal Medicine

## 2012-07-03 ENCOUNTER — Other Ambulatory Visit: Payer: Self-pay | Admitting: Internal Medicine

## 2012-07-03 NOTE — Telephone Encounter (Signed)
sertraline (ZOLOFT) 50 MG tablet  # 30

## 2012-07-04 MED ORDER — SERTRALINE HCL 50 MG PO TABS: 50.0000 mg | ORAL_TABLET | Freq: Every day | ORAL | Status: DC

## 2012-07-04 NOTE — Telephone Encounter (Signed)
Zoloft refill request. Pt last seen on 8/23. Med last filled on 3/26 #30 with 5 refills.

## 2012-07-10 ENCOUNTER — Other Ambulatory Visit: Payer: Self-pay | Admitting: Internal Medicine

## 2012-07-10 MED ORDER — GLIPIZIDE 5 MG PO TABS: 2.5000 mg | ORAL_TABLET | Freq: Two times a day (BID) | ORAL | Status: DC

## 2012-07-10 NOTE — Telephone Encounter (Signed)
glipiZIDE (GLUCOTROL) 5 MG tablet  #30

## 2012-07-10 NOTE — Telephone Encounter (Signed)
Med filled.  

## 2012-07-13 ENCOUNTER — Other Ambulatory Visit: Payer: Self-pay | Admitting: Internal Medicine

## 2012-07-13 MED ORDER — GLIPIZIDE 5 MG PO TABS: 2.5000 mg | ORAL_TABLET | Freq: Two times a day (BID) | ORAL | Status: DC

## 2012-07-13 NOTE — Telephone Encounter (Signed)
Med filled.  

## 2012-07-13 NOTE — Telephone Encounter (Signed)
Refill on Glucotrol 5 mg tablets 30 QTY

## 2012-08-10 ENCOUNTER — Other Ambulatory Visit: Payer: Self-pay | Admitting: *Deleted

## 2012-08-10 MED ORDER — LOSARTAN POTASSIUM-HCTZ 100-25 MG PO TABS: 1.0000 | ORAL_TABLET | Freq: Every day | ORAL | Status: DC

## 2012-08-10 NOTE — Telephone Encounter (Signed)
Med filled.  

## 2012-08-14 ENCOUNTER — Ambulatory Visit (INDEPENDENT_AMBULATORY_CARE_PROVIDER_SITE_OTHER): Payer: 59 | Admitting: Adult Health

## 2012-08-14 ENCOUNTER — Encounter: Payer: Self-pay | Admitting: Adult Health

## 2012-08-14 VITALS — BP 172/93 | HR 68 | Temp 98.3°F | Wt 175.0 lb

## 2012-08-14 DIAGNOSIS — M778 Other enthesopathies, not elsewhere classified: Secondary | ICD-10-CM

## 2012-08-14 DIAGNOSIS — I10 Essential (primary) hypertension: Secondary | ICD-10-CM

## 2012-08-14 DIAGNOSIS — M7989 Other specified soft tissue disorders: Secondary | ICD-10-CM | POA: Insufficient documentation

## 2012-08-14 DIAGNOSIS — M7722 Periarthritis, left wrist: Secondary | ICD-10-CM

## 2012-08-14 DIAGNOSIS — E785 Hyperlipidemia, unspecified: Secondary | ICD-10-CM

## 2012-08-14 LAB — CBC WITH DIFFERENTIAL/PLATELET
Basophils Absolute: 0 10*3/uL (ref 0.0–0.1)
Eosinophils Relative: 1 % (ref 0.0–5.0)
Lymphocytes Relative: 14.6 % (ref 12.0–46.0)
Monocytes Relative: 9.2 % (ref 3.0–12.0)
Platelets: 306 10*3/uL (ref 150.0–400.0)
RDW: 15.5 % — ABNORMAL HIGH (ref 11.5–14.6)
WBC: 8.2 10*3/uL (ref 4.5–10.5)

## 2012-08-14 LAB — C-REACTIVE PROTEIN: CRP: 15 mg/dL (ref 0.5–20.0)

## 2012-08-14 LAB — SEDIMENTATION RATE: Sed Rate: 55 mm/hr — ABNORMAL HIGH (ref 0–22)

## 2012-08-14 MED ORDER — CEPHALEXIN 500 MG PO CAPS: 500.0000 mg | ORAL_CAPSULE | Freq: Two times a day (BID) | ORAL | Status: DC

## 2012-08-14 NOTE — Patient Instructions (Addendum)
  Please have labs drawn prior to leaving the office.  Please have doppler study done today at 1 pm per instructions.  Start the antibiotic today. You will take this twice daily for 10 days.

## 2012-08-14 NOTE — Assessment & Plan Note (Signed)
Swelling, erythema and pain in left upper extremity. R/o DVT, inflammatory arthritis. Labs: cbc, sed rate, crp. Sent patient for stat doppler. Prophylactic tx with Keflex x 10 days.

## 2012-08-14 NOTE — Progress Notes (Signed)
Subjective:    Patient ID: Monica [name], female    DOB: 24-Nov-1929, 79 y.o.   MRN: [id number]  HPI  Patient is a pleasant 79 y/o female who presents to clinic with the following symptoms: Pain in the left shoulder which began approximately 5 days ago.  There was decreased ROM and pain was 10/10. This pain subsided; however, the pain then moved into her left elbow also producing decreased ROM.  Then swelling in her left arm began ~ 2 days ago. Patient reports improving ROM however swelling and pain persist. Denies fever, chills. She denies any bites. Patient is brought in to clinic by her granddaughter.   Current Outpatient Prescriptions on File Prior to Visit  Medication Sig Dispense Refill  . acetaminophen (TYLENOL) 500 MG tablet Take 500 mg by mouth as needed.        Marland Kitchen atenolol (TENORMIN) 50 MG tablet Take 1 tablet (50 mg total) by mouth 2 (two) times daily.  60 tablet  3  . CLEVER CHEK AUTO-CODE VOICE test strip USE 1 TIME DAILY FOR DIABETIC TESTING  100 each  5  . glipiZIDE (GLUCOTROL) 5 MG tablet Take 0.5 tablets (2.5 mg total) by mouth 2 (two) times daily before a meal.  30 tablet  3  . Incontinence Supply Disposable (DISPOSABLE BRIEF LARGE) MISC 1 application by Does not apply route 2 (two) times daily.  36 each  12  . Lancets MISC USE 1 TIME DAILY FOR DIABETIC TESTING  100 each  5  . lisinopril (PRINIVIL,ZESTRIL) 40 MG tablet Take 1 tablet (40 mg total) by mouth daily.  30 tablet  5  . losartan-hydrochlorothiazide (HYZAAR) 100-25 MG per tablet Take 1 tablet by mouth daily.  30 tablet  5  . metFORMIN (GLUCOPHAGE) 850 MG tablet Take 1 tablet (850 mg total) by mouth 2 (two) times daily with a meal.  60 tablet  3  . rosuvastatin (CRESTOR) 20 MG tablet Take 1 tablet (20 mg total) by mouth daily.  30 tablet  5  . sertraline (ZOLOFT) 50 MG tablet Take 1 tablet (50 mg total) by mouth daily.  30 tablet  5   Current Facility-Administered Medications on File Prior to Visit  Medication Dose Route  Frequency Provider Last Rate Last Dose  . cyanocobalamin ((VITAMIN B-12)) injection 1,000 mcg  1,000 mcg Intramuscular Once [name], MD         Review of Systems  Constitutional: Negative for fever, chills and appetite change.  Respiratory: Negative.   Cardiovascular:       Swelling of left arm and hand  Musculoskeletal:       Pain with movement of hand. ROM improved at shoulder, elbow joint  Skin:       Redness of left arm  Neurological: Negative for numbness.    BP 172/93  Pulse 68  Temp(Src) 98.3 F (36.8 C) (Oral)  Wt 175 lb (79.379 kg)  BMI 29.59 kg/m2  SpO2 98%     Objective:   Physical Exam  Constitutional: She is oriented to person, place, and time. She appears well-developed and well-nourished. No distress.  HENT:  Head: Normocephalic and atraumatic.  Cardiovascular: Normal rate and regular rhythm.   2+ edema left hand; 1+ edema left arm  Pulmonary/Chest: Effort normal.  Musculoskeletal: She exhibits edema and tenderness.  Edema Left UE. See cardiovascular note. Pain with hyperextension and flexion of left hand.  Neurological: She is alert and oriented to person, place, and time.  Skin: Skin is warm.  There is erythema.  Erythema left mid upper extremity to hand.  Psychiatric: She has a normal mood and affect. Her behavior is normal. Judgment and thought content normal.          Assessment & Plan:

## 2012-08-17 ENCOUNTER — Telehealth: Payer: Self-pay | Admitting: Internal Medicine

## 2012-08-17 NOTE — Telephone Encounter (Signed)
Patient calling back stating she did not receive the results of her blood work.

## 2012-08-17 NOTE — Telephone Encounter (Signed)
Monica [name], I spoke to the patient on Friday and gave her the results of her labs and the ultrasound. Please call her and let her know that the ultrasound was normal. It did not show any blood clots. Let her know that one of the blood test showed some inflammation. I am treating her for this with the antibiotic. Please ask her if her arm is feeling better.  Thanks, Pelham Hennick

## 2012-08-18 NOTE — Telephone Encounter (Signed)
Informed patient of results and she verbally agreed. Per patient the swelling in her hand has went down but her upper arm and elbow is hurting. But other than that she is feeling better.

## 2012-08-26 ENCOUNTER — Encounter: Payer: Self-pay | Admitting: Internal Medicine

## 2012-09-11 ENCOUNTER — Other Ambulatory Visit: Payer: Self-pay | Admitting: *Deleted

## 2012-09-14 MED ORDER — ATENOLOL 50 MG PO TABS: 50.0000 mg | ORAL_TABLET | Freq: Two times a day (BID) | ORAL | Status: DC

## 2012-10-12 ENCOUNTER — Other Ambulatory Visit: Payer: Self-pay | Admitting: *Deleted

## 2012-10-12 ENCOUNTER — Encounter: Payer: Self-pay | Admitting: *Deleted

## 2012-10-12 MED ORDER — ROSUVASTATIN CALCIUM 20 MG PO TABS: 20.0000 mg | ORAL_TABLET | Freq: Every day | ORAL | Status: DC

## 2012-10-18 ENCOUNTER — Telehealth: Payer: Self-pay | Admitting: Internal Medicine

## 2012-10-18 DIAGNOSIS — E119 Type 2 diabetes mellitus without complications: Secondary | ICD-10-CM

## 2012-10-18 NOTE — Telephone Encounter (Signed)
patient is overdue for diabetes followup.  Please make patient APPt and maek appt for fasting labs prior to appt if convenient,  If Not ,  give them an AM appt so they can be done same day

## 2012-10-19 NOTE — Telephone Encounter (Signed)
Appointment may 27 pt aware of appointment.  Pt stated because of getting driver to bring her she would like to do labs same day

## 2012-11-17 ENCOUNTER — Ambulatory Visit: Payer: 59 | Admitting: Internal Medicine

## 2012-12-07 ENCOUNTER — Other Ambulatory Visit: Payer: Self-pay | Admitting: *Deleted

## 2012-12-07 MED ORDER — GLIPIZIDE 5 MG PO TABS: 2.5000 mg | ORAL_TABLET | Freq: Two times a day (BID) | ORAL | Status: DC

## 2012-12-28 ENCOUNTER — Other Ambulatory Visit: Payer: Self-pay | Admitting: *Deleted

## 2012-12-28 MED ORDER — LISINOPRIL 40 MG PO TABS: 40.0000 mg | ORAL_TABLET | Freq: Every day | ORAL | Status: DC

## 2013-02-08 ENCOUNTER — Other Ambulatory Visit: Payer: Self-pay | Admitting: *Deleted

## 2013-02-08 MED ORDER — LISINOPRIL 40 MG PO TABS: 40.0000 mg | ORAL_TABLET | Freq: Every day | ORAL | Status: DC

## 2013-02-08 MED ORDER — ATENOLOL 50 MG PO TABS: 50.0000 mg | ORAL_TABLET | Freq: Two times a day (BID) | ORAL | Status: DC

## 2013-02-10 ENCOUNTER — Other Ambulatory Visit: Payer: Self-pay | Admitting: *Deleted

## 2013-02-11 NOTE — Telephone Encounter (Signed)
Yes please wait for appt,  She has not had had renal function since last year.

## 2013-02-11 NOTE — Telephone Encounter (Signed)
Patient has an appointment scheduled 02/12/13 patient notified no refills until appointment scheduled notified last refill,, should I wait for appointment please advise?

## 2013-02-12 ENCOUNTER — Ambulatory Visit: Payer: Self-pay | Admitting: Internal Medicine

## 2013-02-12 ENCOUNTER — Encounter: Payer: Self-pay | Admitting: Internal Medicine

## 2013-02-12 ENCOUNTER — Ambulatory Visit (INDEPENDENT_AMBULATORY_CARE_PROVIDER_SITE_OTHER): Payer: 59 | Admitting: Internal Medicine

## 2013-02-12 VITALS — BP 136/64 | HR 57 | Temp 97.9°F | Resp 14

## 2013-02-12 DIAGNOSIS — E538 Deficiency of other specified B group vitamins: Secondary | ICD-10-CM

## 2013-02-12 DIAGNOSIS — M25561 Pain in right knee: Secondary | ICD-10-CM

## 2013-02-12 DIAGNOSIS — M25569 Pain in unspecified knee: Secondary | ICD-10-CM

## 2013-02-12 DIAGNOSIS — Z7189 Other specified counseling: Secondary | ICD-10-CM

## 2013-02-12 DIAGNOSIS — F17200 Nicotine dependence, unspecified, uncomplicated: Secondary | ICD-10-CM

## 2013-02-12 DIAGNOSIS — E1165 Type 2 diabetes mellitus with hyperglycemia: Secondary | ICD-10-CM

## 2013-02-12 DIAGNOSIS — M959 Acquired deformity of musculoskeletal system, unspecified: Secondary | ICD-10-CM

## 2013-02-12 DIAGNOSIS — I10 Essential (primary) hypertension: Secondary | ICD-10-CM

## 2013-02-12 DIAGNOSIS — E559 Vitamin D deficiency, unspecified: Secondary | ICD-10-CM

## 2013-02-12 DIAGNOSIS — M25562 Pain in left knee: Secondary | ICD-10-CM

## 2013-02-12 DIAGNOSIS — Z23 Encounter for immunization: Secondary | ICD-10-CM

## 2013-02-12 DIAGNOSIS — IMO0001 Reserved for inherently not codable concepts without codable children: Secondary | ICD-10-CM

## 2013-02-12 DIAGNOSIS — E785 Hyperlipidemia, unspecified: Secondary | ICD-10-CM

## 2013-02-12 DIAGNOSIS — G8929 Other chronic pain: Secondary | ICD-10-CM

## 2013-02-12 DIAGNOSIS — R300 Dysuria: Secondary | ICD-10-CM

## 2013-02-12 DIAGNOSIS — Z1239 Encounter for other screening for malignant neoplasm of breast: Secondary | ICD-10-CM

## 2013-02-12 DIAGNOSIS — M958 Other specified acquired deformities of musculoskeletal system: Secondary | ICD-10-CM

## 2013-02-12 DIAGNOSIS — Z716 Tobacco abuse counseling: Secondary | ICD-10-CM

## 2013-02-12 DIAGNOSIS — R5381 Other malaise: Secondary | ICD-10-CM

## 2013-02-12 LAB — URINALYSIS, ROUTINE W REFLEX MICROSCOPIC
Ketones, ur: NEGATIVE
Total Protein, Urine: NEGATIVE
Urine Glucose: NEGATIVE
Urobilinogen, UA: 0.2 (ref 0.0–1.0)

## 2013-02-12 LAB — CBC WITH DIFFERENTIAL/PLATELET
Basophils Absolute: 0.1 10*3/uL (ref 0.0–0.1)
Basophils Relative: 0.5 % (ref 0.0–3.0)
Eosinophils Absolute: 0.1 10*3/uL (ref 0.0–0.7)
HCT: 43.9 % (ref 36.0–46.0)
Hemoglobin: 14.8 g/dL (ref 12.0–15.0)
Lymphs Abs: 2.2 10*3/uL (ref 0.7–4.0)
MCHC: 33.6 g/dL (ref 30.0–36.0)
MCV: 87.2 fl (ref 78.0–100.0)
Monocytes Absolute: 0.7 10*3/uL (ref 0.1–1.0)
Neutro Abs: 7 10*3/uL (ref 1.4–7.7)
RBC: 5.04 Mil/uL (ref 3.87–5.11)
RDW: 15.6 % — ABNORMAL HIGH (ref 11.5–14.6)

## 2013-02-12 LAB — MICROALBUMIN / CREATININE URINE RATIO
Creatinine,U: 54.5 mg/dL
Microalb, Ur: 1.9 mg/dL (ref 0.0–1.9)

## 2013-02-12 LAB — LIPID PANEL
LDL Cholesterol: 67 mg/dL (ref 0–99)
Total CHOL/HDL Ratio: 4

## 2013-02-12 LAB — HM DIABETES FOOT EXAM: HM Diabetic Foot Exam: ABNORMAL

## 2013-02-12 MED ORDER — TETANUS-DIPHTH-ACELL PERTUSSIS 5-2.5-18.5 LF-MCG/0.5 IM SUSP: 0.5000 mL | Freq: Once | INTRAMUSCULAR | Status: DC

## 2013-02-12 MED ORDER — HYDROCODONE-ACETAMINOPHEN 5-325 MG PO TABS: 1.0000 | ORAL_TABLET | Freq: Every day | ORAL | Status: DC | PRN
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
## 2013-02-12 NOTE — Patient Instructions (Addendum)
1) For your knees:  Stop the ibuprofen and start taking meloxicam 15 mg daily and Tylenol 500 mg every 6 hours if needed  Save the vicodin for once daily use either before cooking or before bed    X rays today    2) FOR THE DIABETES   Check sugars twice a day .,  1) fasting   AND 2) 2 hours after a meal  I will review labs from today and makes changes in meds once I see them  Use Mychart to send me blood sugars once a week   3 ) we will get an x ray of your left collarbone

## 2013-02-13 LAB — VITAMIN D 25 HYDROXY (VIT D DEFICIENCY, FRACTURES): Vit D, 25-Hydroxy: 25 ng/mL — ABNORMAL LOW (ref 30–89)

## 2013-02-14 ENCOUNTER — Encounter: Payer: Self-pay | Admitting: Internal Medicine

## 2013-02-14 MED ORDER — INSULIN NPH (HUMAN) (ISOPHANE) 100 UNIT/ML ~~LOC~~ SUSP: 15.0000 [IU] | Freq: Every day | SUBCUTANEOUS | Status: DC

## 2013-02-14 MED ORDER — GLIPIZIDE 5 MG PO TABS: 5.0000 mg | ORAL_TABLET | Freq: Two times a day (BID) | ORAL | Status: DC

## 2013-02-14 NOTE — Assessment & Plan Note (Signed)
Well controlled on current regimen. Renal function stable, no changes today. 

## 2013-02-14 NOTE — Assessment & Plan Note (Signed)
vicodin and plain films ordered to evaluated for DJD.

## 2013-02-14 NOTE — Assessment & Plan Note (Signed)
UA abnormal, sympstoms intermittent,   Urine culture ordered

## 2013-02-14 NOTE — Assessment & Plan Note (Signed)
A1c after loss to follow up for over a year is 10.0  She will need to return with granddaughter to start insulin. NPH at bedtime. Will increase metformin and glipizide until they can return.

## 2013-02-14 NOTE — Assessment & Plan Note (Signed)
LDL and triglycerides are at goal on current medications. sHe has no side effects and liver enzymes are normal. No changes today

## 2013-02-14 NOTE — Assessment & Plan Note (Signed)
The patient was counseled on the dangers of tobacco use, and was advised to quit and reluctant to quit.  Reviewed strategies to maximize success, including removing cigarettes and smoking materials from environment.

## 2013-02-15 ENCOUNTER — Encounter: Payer: Self-pay | Admitting: Emergency Medicine

## 2013-02-17 ENCOUNTER — Institutional Professional Consult (permissible substitution): Payer: 59 | Admitting: *Deleted

## 2013-02-17 DIAGNOSIS — E1165 Type 2 diabetes mellitus with hyperglycemia: Secondary | ICD-10-CM

## 2013-02-23 ENCOUNTER — Telehealth: Payer: Self-pay | Admitting: Internal Medicine

## 2013-02-23 NOTE — Telephone Encounter (Signed)
Pt states she was to call with test results from home meter:  3 days last week: 113 fasting 174 w/ food 115 fasting 109 fasting 115 with food 86 fasting 99 with food  Asked pt to repeat/confirm above results and were the same, specific days not given.  Monday 103 fasting 161 with food  This morning  134 fasting  When pt checks sugars, she says when she gets up in the morning and takes it it is okay, and then when she does eat it jumps up, and then later on in the afternoon when she tests it is okay again.  Pt states she is taking insulin at night, 2 metformin in the mornings, and glipizide twice daily before meals.  Wondering if any kind of worries/stress would cause her sugars to jump.  Pt is concerned and asking for call.

## 2013-02-24 MED ORDER — ROSUVASTATIN CALCIUM 20 MG PO TABS: 20.0000 mg | ORAL_TABLET | Freq: Every day | ORAL | Status: DC

## 2013-02-24 MED ORDER — ATENOLOL 50 MG PO TABS: 50.0000 mg | ORAL_TABLET | Freq: Two times a day (BID) | ORAL | Status: DC

## 2013-02-24 MED ORDER — LOSARTAN POTASSIUM-HCTZ 100-25 MG PO TABS: 1.0000 | ORAL_TABLET | Freq: Every day | ORAL | Status: DC

## 2013-02-24 MED ORDER — LISINOPRIL 40 MG PO TABS: 40.0000 mg | ORAL_TABLET | Freq: Every day | ORAL | Status: DC

## 2013-02-24 MED ORDER — SERTRALINE HCL 50 MG PO TABS: 50.0000 mg | ORAL_TABLET | Freq: Every day | ORAL | Status: DC

## 2013-02-24 MED ORDER — METFORMIN HCL 850 MG PO TABS: 850.0000 mg | ORAL_TABLET | Freq: Two times a day (BID) | ORAL | Status: DC

## 2013-02-24 NOTE — Telephone Encounter (Signed)
Pt notified and verbalized understanding.

## 2013-02-24 NOTE — Telephone Encounter (Signed)
Her numbers are not that bad.  But She should be waiting 2 hours after she eats to check her blood sugar.  It should come down to < 150 in a well controlled diabetic patient. If it is not she needs to review her diet to see if she is eating too many starches. She soul be avoiding potatoes,   Breaded meats,  And rice,  And eating fresh fruit instead of fruit cocktail or cakes. She needs to submit a brief descripiton of her meals with her next set of blood sugars

## 2013-02-25 ENCOUNTER — Telehealth: Payer: Self-pay | Admitting: Internal Medicine

## 2013-02-25 MED ORDER — METFORMIN HCL 850 MG PO TABS: 850.0000 mg | ORAL_TABLET | Freq: Two times a day (BID) | ORAL | Status: DC

## 2013-02-25 NOTE — Telephone Encounter (Signed)
Script sent to [facility] cap patient notified

## 2013-02-25 NOTE — Telephone Encounter (Signed)
Pt needing metformin.  Pt states she did not know she got a prescription for it and "they" did not take it to the drug store for her.  Pt asking if we can send this to [facility] for her.

## 2013-02-25 NOTE — Telephone Encounter (Signed)
Pt is calling back to get Metformin called in and she is needing it for today she does not have any more.

## 2013-03-02 ENCOUNTER — Encounter: Payer: Self-pay | Admitting: Internal Medicine

## 2013-03-09 ENCOUNTER — Telehealth: Payer: Self-pay | Admitting: Internal Medicine

## 2013-03-09 MED ORDER — ATENOLOL 50 MG PO TABS: 50.0000 mg | ORAL_TABLET | Freq: Two times a day (BID) | ORAL | Status: DC

## 2013-03-09 MED ORDER — LISINOPRIL 40 MG PO TABS: 40.0000 mg | ORAL_TABLET | Freq: Every day | ORAL | Status: DC

## 2013-03-09 NOTE — Telephone Encounter (Signed)
Refill requests were not received from [facility] on any of those dates. Rx sent to pharmacy by escript

## 2013-03-09 NOTE — Telephone Encounter (Signed)
[facility] called and states they sent over refill request electronically not faxed on 9/11,9/12, and 9/15th for lisinopril 40 mg and atenolol 50 mg for patient. They said that the status on their end shows we received these request.  Please send refill in.

## 2013-03-09 NOTE — Telephone Encounter (Signed)
Your reported blood sugars are excellent.  Continue current medications and follow up after nov 22nd for labs and OV

## 2013-03-09 NOTE — Telephone Encounter (Signed)
Sugar readings last week:  107 with no food 132 with food after 2 hours  98 with no food 133 with food after 2 hours 137 with no food 104 with food after 2 hours 113 with no food 161 with food after 2 hours  This week: 120 with food after 2 hours 112 with no food 134 with no food 124 with food  Took this morning without food 102

## 2013-03-10 NOTE — Telephone Encounter (Signed)
Pt notified and verbalized understanding.

## 2013-03-16 ENCOUNTER — Telehealth: Payer: Self-pay | Admitting: Internal Medicine

## 2013-03-16 DIAGNOSIS — E119 Type 2 diabetes mellitus without complications: Secondary | ICD-10-CM

## 2013-03-16 MED ORDER — INSULIN SYRINGES (DISPOSABLE) U-100 1 ML MISC: 15.0000 [IU] | Freq: Every day | Status: DC

## 2013-03-16 NOTE — Telephone Encounter (Signed)
Pt called she has 6 needles left. For her insulin Pt would like dr Demetrios Isaacs nurse to call her  Pt uses 1 needle daily [facility]  On [address]

## 2013-03-16 NOTE — Telephone Encounter (Signed)
Patient notified script called to pharmacy

## 2013-03-23 ENCOUNTER — Telehealth: Payer: Self-pay | Admitting: Internal Medicine

## 2013-03-23 NOTE — Telephone Encounter (Signed)
The patient's diabetic readings   9.22.14 - 104 9.23.14 - 100 9.24.14 - 132 9.25.14 - 122 9.26.14 -138 9.27.14 -136 9.28.14 -114

## 2013-03-24 NOTE — Telephone Encounter (Signed)
Your blood sugars are in good range,  continue your current medications and keep your follow up appt.

## 2013-03-25 NOTE — Telephone Encounter (Signed)
Pt notified and verbalized understanding.

## 2013-04-09 ENCOUNTER — Other Ambulatory Visit: Payer: Self-pay | Admitting: *Deleted

## 2013-04-09 MED ORDER — LOSARTAN POTASSIUM-HCTZ 100-25 MG PO TABS: 1.0000 | ORAL_TABLET | Freq: Every day | ORAL | Status: DC

## 2013-05-10 ENCOUNTER — Telehealth: Payer: Self-pay | Admitting: Internal Medicine

## 2013-05-10 MED ORDER — ROSUVASTATIN CALCIUM 20 MG PO TABS: 20.0000 mg | ORAL_TABLET | Freq: Every day | ORAL | Status: DC

## 2013-05-10 NOTE — Telephone Encounter (Signed)
Pharmacy states they have faxed several times for the last week to get refill on Crestor for pt.

## 2013-05-10 NOTE — Telephone Encounter (Signed)
No refill requests have been received from [facility].  Rx sent to pharmacy by escript

## 2013-05-13 ENCOUNTER — Ambulatory Visit: Payer: 59 | Admitting: Internal Medicine

## 2013-05-14 ENCOUNTER — Ambulatory Visit: Payer: 59 | Admitting: Internal Medicine

## 2013-05-19 ENCOUNTER — Encounter: Payer: Self-pay | Admitting: Emergency Medicine

## 2013-05-19 ENCOUNTER — Encounter: Payer: Self-pay | Admitting: Internal Medicine

## 2013-05-19 ENCOUNTER — Ambulatory Visit (INDEPENDENT_AMBULATORY_CARE_PROVIDER_SITE_OTHER): Payer: 59 | Admitting: Internal Medicine

## 2013-05-19 ENCOUNTER — Encounter (INDEPENDENT_AMBULATORY_CARE_PROVIDER_SITE_OTHER): Payer: Self-pay

## 2013-05-19 VITALS — BP 140/76 | HR 95 | Temp 98.1°F | Resp 12 | Ht 64.5 in | Wt 174.5 lb

## 2013-05-19 DIAGNOSIS — F17200 Nicotine dependence, unspecified, uncomplicated: Secondary | ICD-10-CM

## 2013-05-19 DIAGNOSIS — E119 Type 2 diabetes mellitus without complications: Secondary | ICD-10-CM

## 2013-05-19 DIAGNOSIS — Z1211 Encounter for screening for malignant neoplasm of colon: Secondary | ICD-10-CM

## 2013-05-19 DIAGNOSIS — I10 Essential (primary) hypertension: Secondary | ICD-10-CM

## 2013-05-19 DIAGNOSIS — Z7189 Other specified counseling: Secondary | ICD-10-CM

## 2013-05-19 DIAGNOSIS — E1149 Type 2 diabetes mellitus with other diabetic neurological complication: Secondary | ICD-10-CM

## 2013-05-19 DIAGNOSIS — Z716 Tobacco abuse counseling: Secondary | ICD-10-CM

## 2013-05-19 DIAGNOSIS — E559 Vitamin D deficiency, unspecified: Secondary | ICD-10-CM

## 2013-05-19 LAB — HM DIABETES FOOT EXAM: HM Diabetic Foot Exam: ABNORMAL

## 2013-05-19 NOTE — Progress Notes (Signed)
Patient ID: Monica [name], female   DOB: 1930/02/26, 79 y.o.   MRN: [id number]   Patient Active Problem List   Diagnosis Date Noted  . Dysuria 02/12/2013  . Bilateral chronic knee pain 09/21/2011  . Tobacco abuse counseling 07/08/2011  . Incontinence of urine 06/13/2011  . Type II or unspecified type diabetes mellitus with neurological manifestations, not stated as uncontrolled(250.60) 06/11/2011  . Tobacco abuse disorder 06/11/2011  . Hyperlipidemia   . Hypertension   . COPD (chronic obstructive pulmonary disease)   . B12 deficiency   . Lung mass     Subjective:  CC:   Chief Complaint  Patient presents with  . Follow-up    3 month follow up    HPI:   Monica [name] a 79 y.o. female who presents for 3 months follow up on DM.  She was lost to follow up for over a year and returned in August  A1c was 10.  Was started on NPH insulin 15 units at bedtime,  , twice daily  Glipizide and metformin and using all meds as directed.   Morning sugars 98 to 134  Post prandials never higher than 150.    Wakes up with both hands numbness and burning that lasts for a few minutes, resolves spontaneously.  Does not occur during the day .  He denies leg burning,  And neck pain.   Still smoking about 3 cigs daily   Had one fall since one visit,  While her grandaughter was out of the home. .  Occurred while making the bed, feet got tankles in the comofrter and she turned and hit head on nightstand.  Did not tell granddaughter for days., Denies dizziness,  Headache.  NOw using  A walker     Past Medical History  Diagnosis Date  . Diabetes mellitus   . Hyperlipidemia   . Hypertension   . COPD (chronic obstructive pulmonary disease)   . B12 deficiency   . H/O: Bell's palsy   . Lung mass April 2011    s/p biopsy, no cancer cells found, Repeat CT Dec 2012 unchanged    Past Surgical History  Procedure Laterality Date  . Cholecystectomy    . Spine surgery  2001    Lumbar  . Abdominal hysterectomy     . Lung biopsy  2012    for positive PET, negative biopsy for malignancy    . cyanocobalamin  1,000 mcg Intramuscular Once     The following portions of the patient's history were reviewed and updated as appropriate: Allergies, current medications, and problem list.    Review of Systems:   12 Pt  review of systems was negative except those addressed in the HPI,     History   Social History  . Marital Status: Married    Spouse Name: N/A    Number of Children: N/A  . Years of Education: N/A   Occupational History  . Not on file.   Social History Main Topics  . Smoking status: Current Every Day Smoker -- 0.40 packs/day for 5 years    Types: Cigarettes  . Smokeless tobacco: Never Used  . Alcohol Use: No  . Drug Use: No  . Sexual Activity: Not on file   Other Topics Concern  . Not on file   Social History Narrative  . No narrative on file    Objective:  Filed Vitals:   05/19/13 1429  BP: 140/76  Pulse: 95  Temp: 98.1 F (36.7 C)  Resp:  12     General appearance: alert, cooperative and appears stated age Ears: normal TM's and external ear canals both ears Throat: lips, mucosa, and tongue normal; teeth and gums normal Neck: no adenopathy, no carotid bruit, supple, symmetrical, trachea midline and thyroid not enlarged, symmetric, no tenderness/mass/nodules Back: symmetric, no curvature. ROM normal. No CVA tenderness. Lungs: clear to auscultation bilaterally Heart: regular rate and rhythm, S1, S2 normal, no murmur, click, rub or gallop Abdomen: soft, non-tender; bowel sounds normal; no masses,  no organomegaly Pulses: 2+ and symmetric Skin: Skin color, texture, turgor normal. No rashes or lesions Lymph nodes: Cervical, supraclavicular, and axillary nodes normal.  Assessment and Plan:  Tobacco abuse counseling Risks of continued tobacco use were discussed. She is not currently interested in tobacco cessation.   Hypertension Well controlled on  current regimen. Renal function stable, no changes today. Lab Results  Component Value Date   CREATININE 1.05 05/19/2013     Type II or unspecified type diabetes mellitus with neurological manifestations, not stated as uncontrolled(250.60) Well-controlled on current medications.  hemoglobin A1c is now less than 7.0 .She is behind on eye exams and her foot exam is normal excpet for decreased sensation over both heels.  Recen tonset of bilateral hand numbness occurring at night noted. She is on the appropriate medications. Lab Results  Component Value Date   HGBA1C 6.4* 05/19/2013      Updated Medication List Outpatient Encounter Prescriptions as of 05/19/2013  Medication Sig  . atenolol (TENORMIN) 50 MG tablet Take 1 tablet (50 mg total) by mouth 2 (two) times daily.  Marland Kitchen CLEVER CHEK AUTO-CODE VOICE test strip USE 1 TIME DAILY FOR DIABETIC TESTING  . glipiZIDE (GLUCOTROL) 5 MG tablet Take 1 tablet (5 mg total) by mouth 2 (two) times daily before a meal.  . ibuprofen (ADVIL,MOTRIN) 200 MG tablet Take 200 mg by mouth every 6 (six) hours as needed for pain.  . Incontinence Supply Disposable (DISPOSABLE BRIEF LARGE) MISC 1 application by Does not apply route 2 (two) times daily.  . insulin NPH (HUMULIN N) 100 UNIT/ML injection Inject 15 Units into the skin at bedtime.  . Insulin Syringes, Disposable, U-100 1 ML MISC 15 Units by Does not apply route at bedtime.  . Lancets MISC USE 1 TIME DAILY FOR DIABETIC TESTING  . lisinopril (PRINIVIL,ZESTRIL) 40 MG tablet Take 1 tablet (40 mg total) by mouth daily.  Marland Kitchen losartan-hydrochlorothiazide (HYZAAR) 100-25 MG per tablet Take 1 tablet by mouth daily.  . metFORMIN (GLUCOPHAGE) 850 MG tablet Take 1 tablet (850 mg total) by mouth 2 (two) times daily with a meal.  . rosuvastatin (CRESTOR) 20 MG tablet Take 1 tablet (20 mg total) by mouth daily.  Marland Kitchen acetaminophen (TYLENOL) 500 MG tablet Take 500 mg by mouth as needed.    Marland Kitchen HYDROcodone-acetaminophen  (NORCO/VICODIN) 5-325 MG per tablet Take 1 tablet by mouth daily as needed for pain.  Marland Kitchen sertraline (ZOLOFT) 50 MG tablet Take 1 tablet (50 mg total) by mouth daily.  . TDaP (BOOSTRIX) 5-2.5-18.5 LF-MCG/0.5 injection Inject 0.5 mLs into the muscle once.     Orders Placed This Encounter  Procedures  . Fecal occult blood, imunochemical  . Hemoglobin A1c  . Microalbumin / creatinine urine ratio  . Comprehensive metabolic panel  . Vit D  25 hydroxy (rtn osteoporosis monitoring)  . Ambulatory referral to Ophthalmology  . HM DIABETES EYE EXAM  . HM DIABETES FOOT EXAM  . HM COLONOSCOPY    No Follow-up on file.

## 2013-05-19 NOTE — Progress Notes (Signed)
Pre-visit discussion using our clinic review tool. No additional management support is needed unless otherwise documented below in the visit note.  

## 2013-05-20 LAB — COMPREHENSIVE METABOLIC PANEL
ALT: 12 U/L (ref 0–35)
AST: 16 U/L (ref 0–37)
Albumin: 4.2 g/dL (ref 3.5–5.2)
Alkaline Phosphatase: 58 U/L (ref 39–117)
BUN: 24 mg/dL — ABNORMAL HIGH (ref 6–23)
CO2: 23 mEq/L (ref 19–32)
Calcium: 10.4 mg/dL (ref 8.4–10.5)
Creat: 1.05 mg/dL (ref 0.50–1.10)
Glucose, Bld: 90 mg/dL (ref 70–99)
Potassium: 4.3 mEq/L (ref 3.5–5.3)
Total Bilirubin: 0.5 mg/dL (ref 0.3–1.2)
Total Protein: 6.9 g/dL (ref 6.0–8.3)

## 2013-05-20 LAB — MICROALBUMIN / CREATININE URINE RATIO
Creatinine, Urine: 35.6 mg/dL
Microalb Creat Ratio: 20.5 mg/g (ref 0.0–30.0)
Microalb, Ur: 0.73 mg/dL (ref 0.00–1.89)

## 2013-05-20 LAB — HEMOGLOBIN A1C
Hgb A1c MFr Bld: 6.4 % — ABNORMAL HIGH (ref <5.7)
Mean Plasma Glucose: 137 mg/dL — ABNORMAL HIGH (ref <117)

## 2013-05-20 LAB — VITAMIN D 25 HYDROXY (VIT D DEFICIENCY, FRACTURES): Vit D, 25-Hydroxy: 24 ng/mL — ABNORMAL LOW (ref 30–89)

## 2013-05-21 ENCOUNTER — Encounter: Payer: Self-pay | Admitting: Internal Medicine

## 2013-05-21 NOTE — Assessment & Plan Note (Addendum)
Well-controlled on current medications.  hemoglobin A1c is now less than 7.0 .She is behind on eye exams and her foot exam is normal excpet for decreased sensation over both heels.  Recen tonset of bilateral hand numbness occurring at night noted. She is on the appropriate medications. Lab Results  Component Value Date   HGBA1C 6.4* 05/19/2013

## 2013-05-21 NOTE — Assessment & Plan Note (Signed)
Well controlled on current regimen. Renal function stable, no changes today. Lab Results  Component Value Date   CREATININE 1.05 05/19/2013

## 2013-05-21 NOTE — Assessment & Plan Note (Signed)
Risks of continued tobacco use were discussed. She is not currently interested in tobacco cessation.    

## 2013-05-24 ENCOUNTER — Encounter: Payer: Self-pay | Admitting: *Deleted

## 2013-06-03 ENCOUNTER — Other Ambulatory Visit: Payer: Self-pay | Admitting: *Deleted

## 2013-06-03 MED ORDER — SERTRALINE HCL 50 MG PO TABS: 50.0000 mg | ORAL_TABLET | Freq: Every day | ORAL | Status: DC

## 2013-08-04 ENCOUNTER — Other Ambulatory Visit: Payer: Self-pay | Admitting: Internal Medicine

## 2013-08-05 ENCOUNTER — Telehealth: Payer: Self-pay | Admitting: Internal Medicine

## 2013-08-05 NOTE — Telephone Encounter (Signed)
Called patient her CBG Now is 126 patient stated she feels fine now. FYI

## 2013-08-05 NOTE — Telephone Encounter (Signed)
Patient Information:  Caller Name: Aimen  Phone: [phone]  Patient: Monica [name], Monica [name]  Gender: Female  DOB: [birth year]  Age: 80 Years  PCP: Deborra Medina (Adults only)  Office Follow Up:  Does the office need to follow up with this patient?: No  Instructions For The Office: N/A   Symptoms  Reason For Call & Symptoms: Patient reports FSBG 59 at 08:30;  she felt weak and shaky; she drank some Pepsi and it was 56, then  she ate peppermint candy and it was 119.  Currently she reports feeling well and without symptoms.  Emergent symptoms ruled out.  Home Care per Diabetes- Low Blood Sugar guideline due to Low blood sugar prevention, questions about.  Home care and parameters for callback given.  Advised to call if she has another episode within a week.  Reviewed Health History In EMR: Yes  Reviewed Medications In EMR: Yes  Reviewed Allergies In EMR: Yes  Reviewed Surgeries / Procedures: Yes  Date of Onset of Symptoms: 08/05/2013  Treatments Tried: Pepsi, Peppermint Candies  Treatments Tried Worked: Yes  Guideline(s) Used:  Diabetes - Low Blood Sugar  Disposition Per Guideline:   Home Care  Reason For Disposition Reached:   Low blood sugar prevention, questions about  Advice Given:  Reassurance  Low blood sugar can result from taking too much diabetes medication, delayed meals, strenuous exercise, or a combination of these factors.  Here is some care advice that should help.  Definition  Low blood sugar (hypoglycemia) is defined as a blood glucose less than 70 mg/dl (3.9 mmol/l).  Symptoms of mild hypoglycemia: Shakiness, weakness, not thinking clearly, headache, trembling, sweating, dizziness, palpitations, and hunger.  Symptoms of severe hypoglycemia: Unable to speak, confusion, seizures, and coma.  Treatment  Milk (1 cup; 240 ml)  Orange juice (1/2 cup; 120 ml)  Pre-packaged juice box (1 box)  Table sugar or honey (3 teaspoons; 15 ml)  Glucose tablets (3 tablets)  Expected  Course:  The symptoms of hypoglycemia should resolve in 10-15 minutes. After the symptoms resolve, eat a small snack to prevent this from recurring. Examples include: cheese and crackers, a glass of milk, or half a sandwich.  If the symptoms of hypoglycemia are not better in 15 minutes, eat some more glucose (10 gm).  Call Back If:  There is no improvement within 30 minutes  Sleepiness or confusion occur  You become worse.  Prevention  Meals: Do not skip or delay meals. Try to eat meals and snacks at the same time every day.  Glucose: Keep some type of sugar (e.g., glucose tablets or gels, honey, juice box) with you at all times. Do you have them available at work, at school, during exercise, and in your car?  Dieting: Talk with your doctor before starting a weight-loss program.  Inform Your Friends and Family  If you take insulin or any other diabetic medication, you are at risk of having a hypoglycemic spell. Inform your family, close friends, and coworkers that you have diabetes and what to do if you have hypoglycemia.  Wear a medical alert bracelet that identifies that you have diabetes.  Patient Will Follow Care Advice:  YES

## 2013-08-23 ENCOUNTER — Ambulatory Visit: Payer: 59 | Admitting: Internal Medicine

## 2013-09-07 ENCOUNTER — Ambulatory Visit: Payer: 59 | Admitting: Internal Medicine

## 2013-09-21 ENCOUNTER — Ambulatory Visit: Payer: 59 | Admitting: Internal Medicine

## 2013-10-01 ENCOUNTER — Other Ambulatory Visit: Payer: Self-pay | Admitting: Internal Medicine

## 2013-10-01 NOTE — Telephone Encounter (Signed)
Ok to fill zoloft?

## 2013-10-07 ENCOUNTER — Ambulatory Visit (INDEPENDENT_AMBULATORY_CARE_PROVIDER_SITE_OTHER): Payer: 59 | Admitting: Internal Medicine

## 2013-10-07 ENCOUNTER — Encounter: Payer: Self-pay | Admitting: Internal Medicine

## 2013-10-07 VITALS — BP 158/78 | HR 62 | Temp 97.5°F | Resp 16 | Ht 65.0 in | Wt 175.0 lb

## 2013-10-07 DIAGNOSIS — Z716 Tobacco abuse counseling: Secondary | ICD-10-CM

## 2013-10-07 DIAGNOSIS — F418 Other specified anxiety disorders: Secondary | ICD-10-CM

## 2013-10-07 DIAGNOSIS — R358 Other polyuria: Secondary | ICD-10-CM

## 2013-10-07 DIAGNOSIS — Z7189 Other specified counseling: Secondary | ICD-10-CM

## 2013-10-07 DIAGNOSIS — E785 Hyperlipidemia, unspecified: Secondary | ICD-10-CM

## 2013-10-07 DIAGNOSIS — Z13820 Encounter for screening for osteoporosis: Secondary | ICD-10-CM

## 2013-10-07 DIAGNOSIS — F17200 Nicotine dependence, unspecified, uncomplicated: Secondary | ICD-10-CM

## 2013-10-07 DIAGNOSIS — F341 Dysthymic disorder: Secondary | ICD-10-CM

## 2013-10-07 DIAGNOSIS — Z1211 Encounter for screening for malignant neoplasm of colon: Secondary | ICD-10-CM

## 2013-10-07 DIAGNOSIS — E119 Type 2 diabetes mellitus without complications: Secondary | ICD-10-CM

## 2013-10-07 DIAGNOSIS — E559 Vitamin D deficiency, unspecified: Secondary | ICD-10-CM

## 2013-10-07 DIAGNOSIS — Z0000 Encounter for general adult medical examination without abnormal findings: Secondary | ICD-10-CM

## 2013-10-07 DIAGNOSIS — R350 Frequency of micturition: Secondary | ICD-10-CM

## 2013-10-07 DIAGNOSIS — R3589 Other polyuria: Secondary | ICD-10-CM

## 2013-10-07 DIAGNOSIS — M25569 Pain in unspecified knee: Secondary | ICD-10-CM

## 2013-10-07 DIAGNOSIS — G8929 Other chronic pain: Secondary | ICD-10-CM

## 2013-10-07 DIAGNOSIS — M25561 Pain in right knee: Secondary | ICD-10-CM

## 2013-10-07 DIAGNOSIS — E538 Deficiency of other specified B group vitamins: Secondary | ICD-10-CM

## 2013-10-07 DIAGNOSIS — R5383 Other fatigue: Secondary | ICD-10-CM

## 2013-10-07 DIAGNOSIS — R5381 Other malaise: Secondary | ICD-10-CM

## 2013-10-07 DIAGNOSIS — I10 Essential (primary) hypertension: Secondary | ICD-10-CM

## 2013-10-07 DIAGNOSIS — E1149 Type 2 diabetes mellitus with other diabetic neurological complication: Secondary | ICD-10-CM

## 2013-10-07 DIAGNOSIS — M25562 Pain in left knee: Secondary | ICD-10-CM

## 2013-10-07 DIAGNOSIS — Z1239 Encounter for other screening for malignant neoplasm of breast: Secondary | ICD-10-CM

## 2013-10-07 LAB — CBC WITH DIFFERENTIAL/PLATELET
BASOS ABS: 0.1 10*3/uL (ref 0.0–0.1)
Basophils Relative: 0.5 % (ref 0.0–3.0)
Eosinophils Absolute: 0.2 10*3/uL (ref 0.0–0.7)
Eosinophils Relative: 2 % (ref 0.0–5.0)
HCT: 45 % (ref 36.0–46.0)
Hemoglobin: 14.7 g/dL (ref 12.0–15.0)
LYMPHS ABS: 1.7 10*3/uL (ref 0.7–4.0)
LYMPHS PCT: 14.5 % (ref 12.0–46.0)
MCHC: 32.6 g/dL (ref 30.0–36.0)
MCV: 89.1 fl (ref 78.0–100.0)
MONOS PCT: 7.5 % (ref 3.0–12.0)
Monocytes Absolute: 0.9 10*3/uL (ref 0.1–1.0)
NEUTROS PCT: 75.5 % (ref 43.0–77.0)
Neutro Abs: 8.8 10*3/uL — ABNORMAL HIGH (ref 1.4–7.7)
Platelets: 252 10*3/uL (ref 150.0–400.0)
RBC: 5.05 Mil/uL (ref 3.87–5.11)
RDW: 16 % — AB (ref 11.5–14.6)
WBC: 11.7 10*3/uL — ABNORMAL HIGH (ref 4.5–10.5)

## 2013-10-07 LAB — COMPREHENSIVE METABOLIC PANEL
ALT: 15 U/L (ref 0–35)
AST: 16 U/L (ref 0–37)
Albumin: 3.8 g/dL (ref 3.5–5.2)
Alkaline Phosphatase: 56 U/L (ref 39–117)
BILIRUBIN TOTAL: 0.8 mg/dL (ref 0.3–1.2)
BUN: 23 mg/dL (ref 6–23)
CO2: 26 meq/L (ref 19–32)
CREATININE: 1.2 mg/dL (ref 0.4–1.2)
Calcium: 10.1 mg/dL (ref 8.4–10.5)
Chloride: 104 mEq/L (ref 96–112)
GFR: 44.46 mL/min — AB (ref >60.00)
GLUCOSE: 133 mg/dL — AB (ref 70–99)
Potassium: 3.9 mEq/L (ref 3.5–5.1)
SODIUM: 140 meq/L (ref 135–145)
TOTAL PROTEIN: 7 g/dL (ref 6.0–8.3)

## 2013-10-07 LAB — LIPID PANEL
CHOLESTEROL: 131 mg/dL (ref 0–200)
HDL: 43.7 mg/dL (ref >39.00)
LDL CALC: 53 mg/dL (ref 0–99)
TRIGLYCERIDES: 172 mg/dL — AB (ref 0.0–149.0)
Total CHOL/HDL Ratio: 3
VLDL: 34.4 mg/dL (ref 0.0–40.0)

## 2013-10-07 LAB — MICROALBUMIN / CREATININE URINE RATIO
Creatinine,U: 81.5 mg/dL
MICROALB/CREAT RATIO: 2.8 mg/g (ref 0.0–30.0)
Microalb, Ur: 2.3 mg/dL — ABNORMAL HIGH (ref 0.0–1.9)

## 2013-10-07 LAB — VITAMIN B12: VITAMIN B 12: 187 pg/mL — AB (ref 211–911)

## 2013-10-07 LAB — TSH: TSH: 3.74 u[IU]/mL (ref 0.35–5.50)

## 2013-10-07 LAB — HEMOGLOBIN A1C: Hgb A1c MFr Bld: 6.5 % (ref 4.6–6.5)

## 2013-10-07 MED ORDER — SERTRALINE HCL 100 MG PO TABS: 100.0000 mg | ORAL_TABLET | Freq: Every day | ORAL | Status: DC

## 2013-10-07 NOTE — Progress Notes (Signed)
Patient ID: Monica [name], female   DOB: 1930/01/22, 80 y.o.   MRN: [id number] The patient is here for annual Medicare wellness examination and management of other chronic and acute problems, including diabetes mellitus, COPD and hypertension .Marland Kitchen  DM has been uncontrolled.  Since adding insulin and glipizide sugars have checked erratically,  Usually < 140 in the morning    Depressiom not controlled with zoloft 50 mg making   The risk factors are reflected in the social history.  The roster of all physicians providing medical care to patient - is listed in the Snapshot section of the chart.  Activities of daily living:  The patient is 100% independent in all ADLs: dressing, toileting, feeding as well as independent mobility  Home safety : The patient has smoke detectors in the home. They wear seatbelts.  There are no firearms at home. There is no violence in the home.   There is no risks for hepatitis, STDs or HIV. There is no   history of blood transfusion. They have no travel history to infectious disease endemic areas of the world.  The patient has seen their dentist in the last six month. They have seen their eye doctor in the last year. They admit to slight hearing difficulty with regard to whispered voices and some television programs.  They have deferred audiologic testing in the last year.  They do not  have excessive sun exposure. Discussed the need for sun protection: hats, long sleeves and use of sunscreen if there is significant sun exposure.   Diet: the importance of a healthy diet is discussed. They do have a healthy diet.  The benefits of regular aerobic exercise were discussed. She walks 4 times per week ,  20 minutes.   Depression screen: there are no signs or vegative symptoms of depression- irritability, change in appetite, anhedonia, sadness/tearfullness.  Cognitive assessment: the patient manages all their financial and personal affairs and is actively engaged. They could  relate day,date,year and events; recalled 2/3 objects at 3 minutes; performed clock-face test normally.  The following portions of the patient's history were reviewed and updated as appropriate: allergies, current medications, past family history, past medical history,  past surgical history, past social history  and problem list.  Visual acuity was not assessed per patient preference since she has regular follow up with her ophthalmologist. Hearing and body mass index were assessed and reviewed.   During the course of the visit the patient was educated and counseled about appropriate screening and preventive services including : fall prevention , diabetes screening, nutrition counseling, colorectal cancer screening, and recommended immunizations.    Objective :  BP 158/78  Pulse 62  Temp(Src) 97.5 F (36.4 C) (Oral)  Resp 16  Ht 5\' 5"  (1.651 m)  Wt 175 lb (79.379 kg)  BMI 29.12 kg/m2  SpO2 99%  General appearance: alert, cooperative and appears stated age Head: Normocephalic, without obvious abnormality, atraumatic Eyes: conjunctivae/corneas clear. PERRL, EOM's intact. Fundi benign. Ears: normal TM's and external ear canals both ears Nose: Nares normal. Septum midline. Mucosa normal. No drainage or sinus tenderness. Throat: lips, mucosa, and tongue normal; teeth and gums normal Neck: no adenopathy, no carotid bruit, no JVD, supple, symmetrical, trachea midline and thyroid not enlarged, symmetric, no tenderness/mass/nodules Lungs: clear to auscultation bilaterally Breasts: normal appearance, no masses or tenderness Heart: regular rate and rhythm, S1, S2 normal, no murmur, click, rub or gallop Abdomen: soft, non-tender; bowel sounds normal; no masses,  no organomegaly Extremities: extremities normal,  atraumatic, no cyanosis or edema Pulses: 2+ and symmetric Skin: Skin color, texture, turgor normal. No rashes or lesions Neurologic: Alert and oriented X 3, normal strength and tone.  Normal symmetric reflexes. Normal coordination and gait.   Assessment and Plan:  Depression with anxiety Symptoms are not well controlled on current sertraline dose.  Increasing to 100 mg daily   Type II or unspecified type diabetes mellitus with neurological manifestations, not stated as uncontrolled(250.60)  well-controlled on current medications.  hemoglobin A1c has been consistently at or  less than 7.0 . Patient is up-to-date on eye exams and foot exam is normal today. Patient has mild proteinuria. Patient is tolerating statin therapy for CAD risk reduction and on ACE/ARB for reduction in proteinuria.    Lab Results  Component Value Date   HGBA1C 6.5 10/07/2013   Lab Results  Component Value Date   MICROALBUR 2.3* 10/07/2013     Tobacco abuse counseling The patient was counseled on the dangers of tobacco use, and was advised to quit.  Reviewed strategies to maximize success, including removing cigarettes and smoking materials from environment.  Hypertension Well controlled on current regimen. Renal function stable, no changes today.  Lab Results  Component Value Date   CREATININE 1.2 10/07/2013   Lab Results  Component Value Date   NA 140 10/07/2013   K 3.9 10/07/2013   CL 104 10/07/2013   CO2 26 10/07/2013      Hyperlipidemia LDL and triglycerides are at goal on current medications. He has no side effects and liver enzymes are normal. No changes today  Lab Results  Component Value Date   CHOL 131 10/07/2013   HDL 43.70 10/07/2013   LDLCALC 53 10/07/2013   LDLDIRECT 59.1 09/17/2011   TRIG 172.0* 10/07/2013   CHOLHDL 3 10/07/2013    Updated Medication List Outpatient Encounter Prescriptions as of 10/07/2013  Medication Sig  . acetaminophen (TYLENOL) 500 MG tablet Take 500 mg by mouth as needed.    Marland Kitchen atenolol (TENORMIN) 50 MG tablet TAKE ONE TABLET TWICE DAILY  . CLEVER CHEK AUTO-CODE VOICE test strip USE 1 TIME DAILY FOR DIABETIC TESTING  . CRESTOR 20 MG tablet TAKE  ONE (1) TABLET EACH DAY  . glipiZIDE (GLUCOTROL) 5 MG tablet Take 1 tablet (5 mg total) by mouth 2 (two) times daily before a meal.  . HYDROcodone-acetaminophen (NORCO/VICODIN) 5-325 MG per tablet Take 1 tablet by mouth daily as needed for pain.  Marland Kitchen ibuprofen (ADVIL,MOTRIN) 200 MG tablet Take 200 mg by mouth every 6 (six) hours as needed for pain.  . Incontinence Supply Disposable (DISPOSABLE BRIEF LARGE) MISC 1 application by Does not apply route 2 (two) times daily.  . insulin NPH (HUMULIN N) 100 UNIT/ML injection Inject 15 Units into the skin at bedtime.  . Insulin Syringes, Disposable, U-100 1 ML MISC 15 Units by Does not apply route at bedtime.  . Lancets MISC USE 1 TIME DAILY FOR DIABETIC TESTING  . lisinopril (PRINIVIL,ZESTRIL) 40 MG tablet TAKE ONE (1) TABLET EACH DAY  . losartan-hydrochlorothiazide (HYZAAR) 100-25 MG per tablet Take 1 tablet by mouth daily.  . metFORMIN (GLUCOPHAGE) 850 MG tablet TAKE ONE TABLET TWICE DAILY WITH MEALS  . sertraline (ZOLOFT) 100 MG tablet Take 1 tablet (100 mg total) by mouth at bedtime.  . TDaP (BOOSTRIX) 5-2.5-18.5 LF-MCG/0.5 injection Inject 0.5 mLs into the muscle once.  . [DISCONTINUED] sertraline (ZOLOFT) 50 MG tablet TAKE ONE (1) TABLET EACH DAY  . ergocalciferol (DRISDOL) 50000 UNITS capsule Take 1  capsule (50,000 Units total) by mouth once a week.

## 2013-10-07 NOTE — Progress Notes (Signed)
Pre-visit discussion using our clinic review tool. No additional management support is needed unless otherwise documented below in the visit note.  

## 2013-10-07 NOTE — Patient Instructions (Addendum)
You had your annual Medicare wellness exam today  We will schedule your mammogram soon, and your bone density test.  Please use the stool kit to send [place] back a sample to test for blood.  This is your colon CA screening test.   We will contact you with the bloodwork results  Please get the time stamp on your glucose meter corrected so it is accurate.  Please continue to  Check sugars once daily;  either fasting  In the AM ,  or 2 hours after a lunch or dinner  meal  If your diabetes is better controlled I will refer you to orthopedics for your knee pain   I am increasing  your depression medication (sertraline) to 100 mg , Please take it in the evening instead of morning   Please allow [place] to schedule appts and discuss your labs with [facility] (you'll need ot sing a form)

## 2013-10-08 ENCOUNTER — Encounter: Payer: Self-pay | Admitting: Emergency Medicine

## 2013-10-08 ENCOUNTER — Encounter: Payer: Self-pay | Admitting: *Deleted

## 2013-10-08 DIAGNOSIS — E559 Vitamin D deficiency, unspecified: Secondary | ICD-10-CM | POA: Insufficient documentation

## 2013-10-08 LAB — FOLATE: Folate: >20 ng/mL

## 2013-10-08 LAB — VITAMIN D 25 HYDROXY (VIT D DEFICIENCY, FRACTURES): VIT D 25 HYDROXY: 21 ng/mL — AB (ref 30–89)

## 2013-10-08 MED ORDER — ERGOCALCIFEROL 1.25 MG (50000 UT) PO CAPS: 50000.0000 [IU] | ORAL_CAPSULE | ORAL | Status: DC

## 2013-10-10 DIAGNOSIS — F339 Major depressive disorder, recurrent, unspecified: Secondary | ICD-10-CM | POA: Insufficient documentation

## 2013-10-10 NOTE — Assessment & Plan Note (Signed)
The patient was counseled on the dangers of tobacco use, and was advised to quit.  Reviewed strategies to maximize success, including removing cigarettes and smoking materials from environment. 

## 2013-10-10 NOTE — Assessment & Plan Note (Signed)
Symptoms are not well controlled on current sertraline dose.  Increasing to 100 mg daily

## 2013-10-10 NOTE — Assessment & Plan Note (Signed)
well-controlled on current medications.  hemoglobin A1c has been consistently at or  less than 7.0 . Patient is up-to-date on eye exams and foot exam is normal today. Patient has mild proteinuria. Patient is tolerating statin therapy for CAD risk reduction and on ACE/ARB for reduction in proteinuria.    Lab Results  Component Value Date   HGBA1C 6.5 10/07/2013   Lab Results  Component Value Date   MICROALBUR 2.3* 10/07/2013

## 2013-10-10 NOTE — Assessment & Plan Note (Signed)
Well controlled on current regimen. Renal function stable, no changes today.  Lab Results  Component Value Date   CREATININE 1.2 10/07/2013   Lab Results  Component Value Date   NA 140 10/07/2013   K 3.9 10/07/2013   CL 104 10/07/2013   CO2 26 10/07/2013

## 2013-10-10 NOTE — Assessment & Plan Note (Signed)
LDL and triglycerides are at goal on current medications. He has no side effects and liver enzymes are normal. No changes today  Lab Results  Component Value Date   CHOL 131 10/07/2013   HDL 43.70 10/07/2013   LDLCALC 53 10/07/2013   LDLDIRECT 59.1 09/17/2011   TRIG 172.0* 10/07/2013   CHOLHDL 3 10/07/2013

## 2013-10-11 ENCOUNTER — Encounter: Payer: Self-pay | Admitting: Emergency Medicine

## 2013-10-18 ENCOUNTER — Telehealth: Payer: Self-pay | Admitting: *Deleted

## 2013-10-18 ENCOUNTER — Ambulatory Visit (INDEPENDENT_AMBULATORY_CARE_PROVIDER_SITE_OTHER): Payer: 59 | Admitting: *Deleted

## 2013-10-18 DIAGNOSIS — E538 Deficiency of other specified B group vitamins: Secondary | ICD-10-CM

## 2013-10-18 MED ORDER — CYANOCOBALAMIN 1000 MCG/ML IJ SOLN: 1000.0000 ug | Freq: Once | INTRAMUSCULAR | Status: DC

## 2013-10-18 NOTE — Progress Notes (Signed)
Patient came into office for scheduled B 12 injection and to each Granddaughter (Regan) how to administer B 12 injection for patient. All standard precautions used and Regan demonstrated very well how to give injections'

## 2013-10-18 NOTE — Telephone Encounter (Signed)
Patient and Granddaughter came into clinic today to learn how to give patient B 12 injection Regan did very well. Patient is ready to continue b 12 injections at home need script. Order on lab stated 3 weekly injections and patient could start SL after three weeks please advise.

## 2013-10-18 NOTE — Telephone Encounter (Signed)
I wourl prefer that she remain on monthly shots for 6 months after the 3 weekly ones

## 2013-10-19 NOTE — Telephone Encounter (Signed)
Left message for patient to return call to office. 

## 2013-10-20 ENCOUNTER — Telehealth: Payer: Self-pay

## 2013-10-20 NOTE — Telephone Encounter (Signed)
Relevant patient education mailed to patient.  

## 2013-10-20 NOTE — Telephone Encounter (Signed)
Pt returned call.  States her granddaughter will be giving her the next two injections.  States she does not know why we called her, that she already came in and they learned how to give the shots.  Please contact pt with further information.

## 2013-10-21 NOTE — Telephone Encounter (Signed)
Left message for patient to return call to office. 

## 2013-10-21 NOTE — Telephone Encounter (Signed)
Patient notified

## 2013-11-01 ENCOUNTER — Other Ambulatory Visit: Payer: Self-pay | Admitting: Internal Medicine

## 2013-11-30 ENCOUNTER — Telehealth: Payer: Self-pay | Admitting: Internal Medicine

## 2013-11-30 MED ORDER — CYANOCOBALAMIN 1000 MCG/ML IJ SOLN: 1000.0000 ug | Freq: Once | INTRAMUSCULAR | Status: DC

## 2013-11-30 NOTE — Telephone Encounter (Signed)
Patient called to request a Rx for her B12 shot be sent to the pharmacy. Please call patient when rx ready/msn

## 2013-12-01 NOTE — Telephone Encounter (Signed)
Script sent  

## 2013-12-01 NOTE — Telephone Encounter (Signed)
Rx sent to pharmacy 11/30/13

## 2013-12-13 ENCOUNTER — Telehealth: Payer: Self-pay | Admitting: *Deleted

## 2013-12-13 DIAGNOSIS — E785 Hyperlipidemia, unspecified: Secondary | ICD-10-CM

## 2013-12-13 MED ORDER — INSULIN NPH (HUMAN) (ISOPHANE) 100 UNIT/ML ~~LOC~~ SUSP: 15.0000 [IU] | Freq: Every day | SUBCUTANEOUS | Status: DC

## 2013-12-13 NOTE — Telephone Encounter (Signed)
Spoke with pt advised Rx sent to pharmacy and pharmacy aware of early refill.

## 2013-12-13 NOTE — Telephone Encounter (Signed)
Pt called states while her grand daughter was administering her Humulin insulin she accidentally dropped the vial and broke it.  Pt is requesting an early refill.  Please advise

## 2013-12-13 NOTE — Telephone Encounter (Signed)
Ok to refill,  Refill sent  

## 2013-12-31 ENCOUNTER — Other Ambulatory Visit: Payer: Self-pay | Admitting: Internal Medicine

## 2014-01-06 ENCOUNTER — Ambulatory Visit (INDEPENDENT_AMBULATORY_CARE_PROVIDER_SITE_OTHER): Payer: 59 | Admitting: Internal Medicine

## 2014-01-06 ENCOUNTER — Encounter: Payer: Self-pay | Admitting: *Deleted

## 2014-01-06 ENCOUNTER — Encounter: Payer: Self-pay | Admitting: Internal Medicine

## 2014-01-06 VITALS — BP 136/84 | HR 64 | Temp 97.9°F | Resp 16 | Ht 64.5 in | Wt 176.8 lb

## 2014-01-06 DIAGNOSIS — E559 Vitamin D deficiency, unspecified: Secondary | ICD-10-CM

## 2014-01-06 DIAGNOSIS — F17200 Nicotine dependence, unspecified, uncomplicated: Secondary | ICD-10-CM

## 2014-01-06 DIAGNOSIS — M25569 Pain in unspecified knee: Secondary | ICD-10-CM

## 2014-01-06 DIAGNOSIS — E785 Hyperlipidemia, unspecified: Secondary | ICD-10-CM

## 2014-01-06 DIAGNOSIS — I10 Essential (primary) hypertension: Secondary | ICD-10-CM

## 2014-01-06 DIAGNOSIS — E1149 Type 2 diabetes mellitus with other diabetic neurological complication: Secondary | ICD-10-CM

## 2014-01-06 DIAGNOSIS — E119 Type 2 diabetes mellitus without complications: Secondary | ICD-10-CM

## 2014-01-06 DIAGNOSIS — M25561 Pain in right knee: Principal | ICD-10-CM

## 2014-01-06 DIAGNOSIS — G8929 Other chronic pain: Secondary | ICD-10-CM

## 2014-01-06 DIAGNOSIS — Z716 Tobacco abuse counseling: Secondary | ICD-10-CM

## 2014-01-06 DIAGNOSIS — Z7189 Other specified counseling: Secondary | ICD-10-CM

## 2014-01-06 DIAGNOSIS — M25562 Pain in left knee: Principal | ICD-10-CM

## 2014-01-06 LAB — LIPID PANEL
Cholesterol: 126 mg/dL (ref 0–200)
HDL: 39.1 mg/dL (ref >39.00)
LDL CALC: 58 mg/dL (ref 0–99)
NonHDL: 86.9
TRIGLYCERIDES: 145 mg/dL (ref 0.0–149.0)
Total CHOL/HDL Ratio: 3
VLDL: 29 mg/dL (ref 0.0–40.0)

## 2014-01-06 LAB — HM MAMMOGRAPHY: HM Mammogram: NEGATIVE

## 2014-01-06 LAB — COMPREHENSIVE METABOLIC PANEL
ALT: 11 U/L (ref 0–35)
AST: 15 U/L (ref 0–37)
Albumin: 3.7 g/dL (ref 3.5–5.2)
Alkaline Phosphatase: 59 U/L (ref 39–117)
BUN: 23 mg/dL (ref 6–23)
CALCIUM: 10.1 mg/dL (ref 8.4–10.5)
CHLORIDE: 105 meq/L (ref 96–112)
CO2: 27 meq/L (ref 19–32)
CREATININE: 1.2 mg/dL (ref 0.4–1.2)
GFR: 44.85 mL/min — AB (ref >60.00)
GLUCOSE: 163 mg/dL — AB (ref 70–99)
POTASSIUM: 3.7 meq/L (ref 3.5–5.1)
Sodium: 139 mEq/L (ref 135–145)
Total Bilirubin: 0.8 mg/dL (ref 0.2–1.2)
Total Protein: 6.6 g/dL (ref 6.0–8.3)

## 2014-01-06 LAB — HM DIABETES FOOT EXAM: HM Diabetic Foot Exam: ABNORMAL

## 2014-01-06 LAB — VITAMIN D 25 HYDROXY (VIT D DEFICIENCY, FRACTURES): VITD: 49.67 ng/mL

## 2014-01-06 LAB — MICROALBUMIN / CREATININE URINE RATIO
Creatinine,U: 68.3 mg/dL
Microalb Creat Ratio: 2.3 mg/g (ref 0.0–30.0)
Microalb, Ur: 1.6 mg/dL (ref 0.0–1.9)

## 2014-01-06 LAB — HEMOGLOBIN A1C: Hgb A1c MFr Bld: 7 % — ABNORMAL HIGH (ref 4.6–6.5)

## 2014-01-06 MED ORDER — TETANUS-DIPHTH-ACELL PERTUSSIS 5-2.5-18.5 LF-MCG/0.5 IM SUSP: 0.5000 mL | Freq: Once | INTRAMUSCULAR | Status: DC

## 2014-01-06 MED ORDER — MELOXICAM 15 MG PO TABS: 15.0000 mg | ORAL_TABLET | Freq: Every day | ORAL | Status: DC

## 2014-01-06 NOTE — Patient Instructions (Addendum)
Your blood pressure is borderline .  I might need to change your medications depending on your home readings  Please Check your BP once a week at home for the next 3 weeks  and call or e mail me the results.  Please also include you pulse .   You still need your tetanus-diptheria-pertussis vaccine (TDaP) but you can get it for less $$$ at your local pharmacy with the script I have provided you.   Managing Your High Blood Pressure Blood pressure is a measurement of how forceful your blood is pressing against the walls of the arteries. Arteries are muscular tubes within the circulatory system. Blood pressure does not stay the same. Blood pressure rises when you are active, excited, or nervous; and it lowers during sleep and relaxation. If the numbers measuring your blood pressure stay above normal most of the time, you are at risk for health problems. High blood pressure (hypertension) is a long-term (chronic) condition in which blood pressure is elevated. A blood pressure reading is recorded as two numbers, such as 120 over 80 (or 120/80). The first, higher number is called the systolic pressure. It is a measure of the pressure in your arteries as the heart beats. The second, lower number is called the diastolic pressure. It is a measure of the pressure in your arteries as the heart relaxes between beats.  Keeping your blood pressure in a normal range is important to your overall health and prevention of health problems, such as heart disease and stroke. When your blood pressure is uncontrolled, your heart has to work harder than normal. High blood pressure is a very common condition in adults because blood pressure tends to rise with age. Men and women are equally likely to have hypertension but at different times in life. Before age 24, men are more likely to have hypertension. After 78 years of age, women are more likely to have it. Hypertension is especially common in African Americans. This condition  often has no signs or symptoms. The cause of the condition is usually not known. Your caregiver can help you come up with a plan to keep your blood pressure in a normal, healthy range. BLOOD PRESSURE STAGES Blood pressure is classified into four stages: normal, prehypertension, stage 1, and stage 2. Your blood pressure reading will be used to determine what type of treatment, if any, is necessary. Appropriate treatment options are tied to these four stages:  Normal  Systolic pressure (mm Hg): below 120.  Diastolic pressure (mm Hg): below 80. Prehypertension  Systolic pressure (mm Hg): 120 to 139.  Diastolic pressure (mm Hg): 80 to 89. Stage1  Systolic pressure (mm Hg): 140 to 159.  Diastolic pressure (mm Hg): 90 to 99. Stage2  Systolic pressure (mm Hg): 160 or above.  Diastolic pressure (mm Hg): 100 or above. RISKS RELATED TO HIGH BLOOD PRESSURE Managing your blood pressure is an important responsibility. Uncontrolled high blood pressure can lead to:  A heart attack.  A stroke.  A weakened blood vessel (aneurysm).  Heart failure.  Kidney damage.  Eye damage.  Metabolic syndrome.  Memory and concentration problems. HOW TO MANAGE YOUR BLOOD PRESSURE Blood pressure can be managed effectively with lifestyle changes and medicines (if needed). Your caregiver will help you come up with a plan to bring your blood pressure within a normal range. Your plan should include the following: Education  Read all information provided by your caregivers about how to control blood pressure.  Educate yourself on the  latest guidelines and treatment recommendations. New research is always being done to further define the risks and treatments for high blood pressure. Lifestylechanges  Control your weight.  Avoid smoking.  Stay physically active.  Reduce the amount of salt in your diet.  Reduce stress.  Control any chronic conditions, such as high cholesterol or  diabetes.  Reduce your alcohol intake. Medicines  Several medicines (antihypertensive medicines) are available, if needed, to bring blood pressure within a normal range. Communication  Review all the medicines you take with your caregiver because there may be side effects or interactions.  Talk with your caregiver about your diet, exercise habits, and other lifestyle factors that may be contributing to high blood pressure.  See your caregiver regularly. Your caregiver can help you create and adjust your plan for managing high blood pressure. RECOMMENDATIONS FOR TREATMENT AND FOLLOW-UP  The following recommendations are based on current guidelines for managing high blood pressure in nonpregnant adults. Use these recommendations to identify the proper follow-up period or treatment option based on your blood pressure reading. You can discuss these options with your caregiver.  Systolic pressure of 123456 to [id number] or diastolic pressure of 80 to 89: Follow up with your caregiver as directed.  Systolic pressure of XX123456 to 0000000 or diastolic pressure of 90 to 100: Follow up with your caregiver within 2 months.  Systolic pressure above 0000000 or diastolic pressure above [id number]: Follow up with your caregiver within 1 month.  Systolic pressure above [id number] or diastolic pressure above [id number]: Consider antihypertensive therapy; follow up with your caregiver within 1 week.  Systolic pressure above A999333 or diastolic pressure above 123456: Begin antihypertensive therapy; follow up with your caregiver within 1 week. Document Released: 03/04/2012 Document Reviewed: 03/04/2012 [facility] Patient Information 2015 [place]. This information is not intended to replace advice given to you by your health care provider. Make sure you discuss any questions you have with your health care provider.

## 2014-01-06 NOTE — Assessment & Plan Note (Signed)
Now with decreased ambulation and gait instability,  Using a walker with front wheels at home .  Has not fallen recently . Discussed seeing an orthopedist

## 2014-01-06 NOTE — Progress Notes (Signed)
Pre-visit discussion using our clinic review tool. No additional management support is needed unless otherwise documented below in the visit note.  

## 2014-01-06 NOTE — Progress Notes (Signed)
Patient ID: Monica [name], female   DOB: 02/01/1930, 80 y.o.   MRN: [id number]   Patient Active Problem List   Diagnosis Date Noted  . Depression with anxiety 10/10/2013  . Unspecified vitamin D deficiency 10/08/2013  . Dysuria 02/12/2013  . Bilateral chronic knee pain 09/21/2011  . Tobacco abuse counseling 07/08/2011  . Incontinence of urine 06/13/2011  . Type II or unspecified type diabetes mellitus with neurological manifestations, not stated as uncontrolled(250.60) 06/11/2011  . Tobacco abuse disorder 06/11/2011  . Hyperlipidemia   . Hypertension   . COPD (chronic obstructive pulmonary disease)   . B12 deficiency   . Lung mass     Subjective:  CC:   Chief Complaint  Patient presents with  . Follow-up    3 month  . Diabetes    HPI:   Monica [name] is a 80 y.o. female who presents for Follow up on chronic issues including DM Type 2,  Decreased mobility secondary to bilateral DJD knees and hypertension.   Patient has no complaints today.  Patient is following a low glycemic index diet and taking all prescribed medications regularly without side effects.  Fasting sugars have been under less than 140 most of the time and post prandials have been under 160 except on rare occasions. Patient is exercising about 3 times per week and intentionally trying to lose weight .  Patient has had an eye exam in the last 12 months and checks feet regularly for signs of infection.  Patient does not walk barefoot outside,  And denies an numbness tingling or burning in feet. Patient is up to date on all recommended vaccinations  She is using a walker with front wheels at home but is developing left shoulder pain which is caused by her use of the walker. She has had no recent falls but tires easily    Past Medical History  Diagnosis Date  . Diabetes mellitus   . Hyperlipidemia   . Hypertension   . COPD (chronic obstructive pulmonary disease)   . B12 deficiency   . H/O: Bell's palsy   . Lung  mass April 2011    s/p biopsy, no cancer cells found, Repeat CT Dec 2012 unchanged    Past Surgical History  Procedure Laterality Date  . Cholecystectomy    . Spine surgery  2001    Lumbar  . Abdominal hysterectomy    . Lung biopsy  2012    for positive PET, negative biopsy for malignancy    . cyanocobalamin  1,000 mcg Intramuscular Once     The following portions of the patient's history were reviewed and updated as appropriate: Allergies, current medications, and problem list.    Review of Systems:   Patient denies headache, fevers, malaise, unintentional weight loss, skin rash, eye pain, sinus congestion and sinus pain, sore throat, dysphagia,  hemoptysis , cough, dyspnea, wheezing, chest pain, palpitations, orthopnea, edema, abdominal pain, nausea, melena, diarrhea, constipation, flank pain, dysuria, hematuria, urinary  Frequency, nocturia, numbness, tingling, seizures,  Focal weakness, Loss of consciousness,  Tremor, insomnia, depression, anxiety, and suicidal ideation.     History   Social History  . Marital Status: Married    Spouse Name: N/A    Number of Children: N/A  . Years of Education: N/A   Occupational History  . Not on file.   Social History Main Topics  . Smoking status: Current Every Day Smoker -- 0.40 packs/day for 5 years    Types: Cigarettes  . Smokeless  tobacco: Never Used  . Alcohol Use: No  . Drug Use: No  . Sexual Activity: Not on file   Other Topics Concern  . Not on file   Social History Narrative  . No narrative on file    Objective:  Filed Vitals:   01/06/14 1111  BP: 136/84  Pulse: 64  Temp: 97.9 F (36.6 C)  Resp: 16     General appearance: alert, cooperative and appears stated age Ears: normal TM's and external ear canals both ears Throat: lips, mucosa, and tongue normal; teeth and gums normal Neck: no adenopathy, no carotid bruit, supple, symmetrical, trachea midline and thyroid not enlarged, symmetric, no  tenderness/mass/nodules Back: symmetric, no curvature. ROM normal. No CVA tenderness. Lungs: clear to auscultation bilaterally Heart: regular rate and rhythm, S1, S2 normal, no murmur, click, rub or gallop Abdomen: soft, non-tender; bowel sounds normal; no masses,  no organomegaly Pulses: 2+ and symmetric Skin: Skin color, texture, turgor normal. No rashes or lesions Lymph nodes: Cervical, supraclavicular, and axillary nodes normal.  Assessment and Plan:  Bilateral chronic knee pain Now with decreased ambulation and gait instability,  Using a walker with front wheels at home .  Has not fallen recently . Discussed seeing an orthopedist   Hypertension Well controlled on current regimen. Renal function stable, no changes today.  Lab Results  Component Value Date   CREATININE 1.2 01/06/2014   Lab Results  Component Value Date   NA 139 01/06/2014   K 3.7 01/06/2014   CL 105 01/06/2014   CO2 27 01/06/2014     Hyperlipidemia Well controlled on current statin therapy.   Liver enzymes are normal , no changes today.  Lab Results  Component Value Date   CHOL 126 01/06/2014   HDL 39.10 01/06/2014   LDLCALC 58 01/06/2014   LDLDIRECT 59.1 09/17/2011   TRIG 145.0 01/06/2014   CHOLHDL 3 01/06/2014   Lab Results  Component Value Date   ALT 11 01/06/2014   AST 15 01/06/2014   ALKPHOS 59 01/06/2014   BILITOT 0.8 01/06/2014     Type II or unspecified type diabetes mellitus with neurological manifestations, not stated as uncontrolled(250.60) Historically well-controlled on current medications.  hemoglobin A1c has been consistently at or  less than 7.0 . Patient is up-to-date on eye exams and foot exam is normal today. Patient is due for urine microalbumin to creatinine ratio at next visit. Patient is tolerating statin therapy for CAD risk reduction and on ACE/ARB for reduction in proteinuria.    Lab Results  Component Value Date   HGBA1C 7.0* 01/06/2014   Lab Results  Component Value Date    MICROALBUR 1.6 01/06/2014     Tobacco abuse counseling The patient was counseled on the dangers of tobacco use, and was advised to quit.  Reviewed strategies to maximize success, including removing cigarettes and smoking materials from environment.     Updated Medication List Outpatient Encounter Prescriptions as of 01/06/2014  Medication Sig  . acetaminophen (TYLENOL) 500 MG tablet Take 500 mg by mouth as needed.    Marland Kitchen atenolol (TENORMIN) 50 MG tablet TAKE ONE TABLET TWICE DAILY  . CLEVER CHEK AUTO-CODE VOICE test strip USE 1 TIME DAILY FOR DIABETIC TESTING  . CRESTOR 20 MG tablet TAKE ONE (1) TABLET EACH DAY  . cyanocobalamin (,VITAMIN B-12,) 1000 MCG/ML injection Inject 1 mL (1,000 mcg total) into the muscle once. I ml IM injection weekly x 3, then monthly  . ergocalciferol (DRISDOL) 50000 UNITS capsule Take 1  capsule (50,000 Units total) by mouth once a week.  Marland Kitchen glipiZIDE (GLUCOTROL) 5 MG tablet TAKE ONE TABLET BY MOUTH TWICE DAILY AFTER MEALS  . HYDROcodone-acetaminophen (NORCO/VICODIN) 5-325 MG per tablet Take 1 tablet by mouth daily as needed for pain.  Marland Kitchen ibuprofen (ADVIL,MOTRIN) 200 MG tablet Take 200 mg by mouth every 6 (six) hours as needed for pain.  . Incontinence Supply Disposable (DISPOSABLE BRIEF LARGE) MISC 1 application by Does not apply route 2 (two) times daily.  . insulin NPH Human (HUMULIN N) 100 UNIT/ML injection Inject 0.15 mLs (15 Units total) into the skin at bedtime.  . Insulin Syringes, Disposable, U-100 1 ML MISC 15 Units by Does not apply route at bedtime.  . Lancets MISC USE 1 TIME DAILY FOR DIABETIC TESTING  . lisinopril (PRINIVIL,ZESTRIL) 40 MG tablet TAKE ONE (1) TABLET EACH DAY  . losartan-hydrochlorothiazide (HYZAAR) 100-25 MG per tablet TAKE ONE (1) TABLET BY MOUTH EVERY DAY  . metFORMIN (GLUCOPHAGE) 850 MG tablet TAKE ONE TABLET TWICE DAILY WITH MEALS  . sertraline (ZOLOFT) 100 MG tablet Take 1 tablet (100 mg total) by mouth at bedtime.  . meloxicam  (MOBIC) 15 MG tablet Take 1 tablet (15 mg total) by mouth daily.  . TDaP (BOOSTRIX) 5-2.5-18.5 LF-MCG/0.5 injection Inject 0.5 mLs into the muscle once.  . Tdap (BOOSTRIX) 5-2.5-18.5 LF-MCG/0.5 injection Inject 0.5 mLs into the muscle once.     Orders Placed This Encounter  Procedures  . Hemoglobin A1c  . Lipid panel  . Microalbumin / creatinine urine ratio  . Comprehensive metabolic panel  . Vit D  25 hydroxy (rtn osteoporosis monitoring)  . Ambulatory referral to Orthopedic Surgery  . HM DIABETES FOOT EXAM    Return in about 6 months (around 07/09/2014) for follow up diabetes.

## 2014-01-07 ENCOUNTER — Telehealth: Payer: Self-pay | Admitting: Internal Medicine

## 2014-01-07 NOTE — Telephone Encounter (Signed)
Relevant patient education mailed to patient.  

## 2014-01-08 ENCOUNTER — Encounter: Payer: Self-pay | Admitting: Internal Medicine

## 2014-01-08 NOTE — Assessment & Plan Note (Signed)
The patient was counseled on the dangers of tobacco use, and was advised to quit.  Reviewed strategies to maximize success, including removing cigarettes and smoking materials from environment. 

## 2014-01-08 NOTE — Assessment & Plan Note (Signed)
Historically well-controlled on current medications.  hemoglobin A1c has been consistently at or  less than 7.0 . Patient is up-to-date on eye exams and foot exam is normal today. Patient is due for urine microalbumin to creatinine ratio at next visit. Patient is tolerating statin therapy for CAD risk reduction and on ACE/ARB for reduction in proteinuria.    Lab Results  Component Value Date   HGBA1C 7.0* 01/06/2014   Lab Results  Component Value Date   MICROALBUR 1.6 01/06/2014

## 2014-01-08 NOTE — Assessment & Plan Note (Signed)
Well controlled on current statin therapy.   Liver enzymes are normal , no changes today.  Lab Results  Component Value Date   CHOL 126 01/06/2014   HDL 39.10 01/06/2014   LDLCALC 58 01/06/2014   LDLDIRECT 59.1 09/17/2011   TRIG 145.0 01/06/2014   CHOLHDL 3 01/06/2014   Lab Results  Component Value Date   ALT 11 01/06/2014   AST 15 01/06/2014   ALKPHOS 59 01/06/2014   BILITOT 0.8 01/06/2014

## 2014-01-08 NOTE — Assessment & Plan Note (Signed)
Well controlled on current regimen. Renal function stable, no changes today.  Lab Results  Component Value Date   CREATININE 1.2 01/06/2014   Lab Results  Component Value Date   NA 139 01/06/2014   K 3.7 01/06/2014   CL 105 01/06/2014   CO2 27 01/06/2014

## 2014-01-10 ENCOUNTER — Encounter: Payer: Self-pay | Admitting: *Deleted

## 2014-01-31 ENCOUNTER — Other Ambulatory Visit: Payer: Self-pay | Admitting: Internal Medicine

## 2014-02-16 ENCOUNTER — Other Ambulatory Visit: Payer: Self-pay | Admitting: Internal Medicine

## 2014-02-26 IMAGING — CR DG CHEST 2V
1 series · 2 of 2 positions shown · non-contrast
Comparison: none

REASON FOR EXAM: neck ands upper back paion , low grade fever
COMMENTS:

PROCEDURE:     DXR - DXR CHEST PA (OR AP) AND LATERAL  - March 22, 2012  [DATE]
RESULT:     Comparison: 10/06/2010

[Series 1: lat · 0.17mm/px · 2 of 2 slices shown]
[im 1/2]
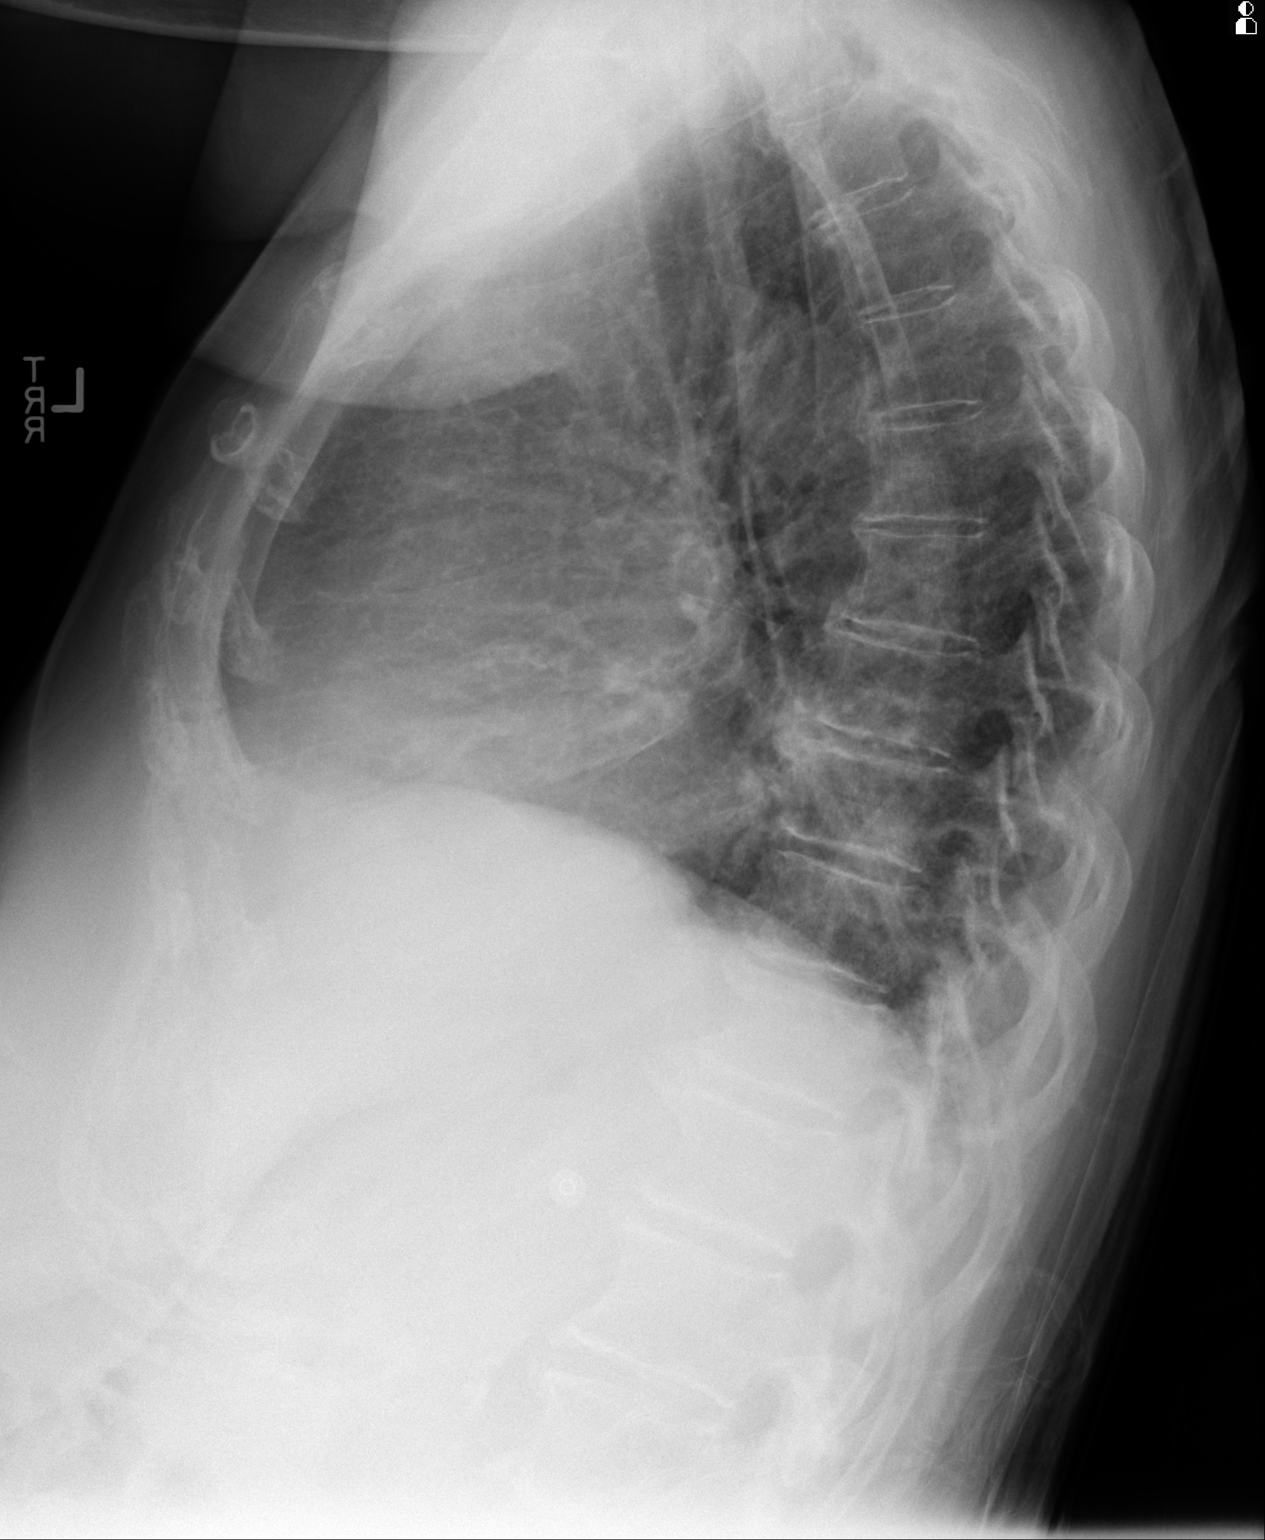
[im 2/2]
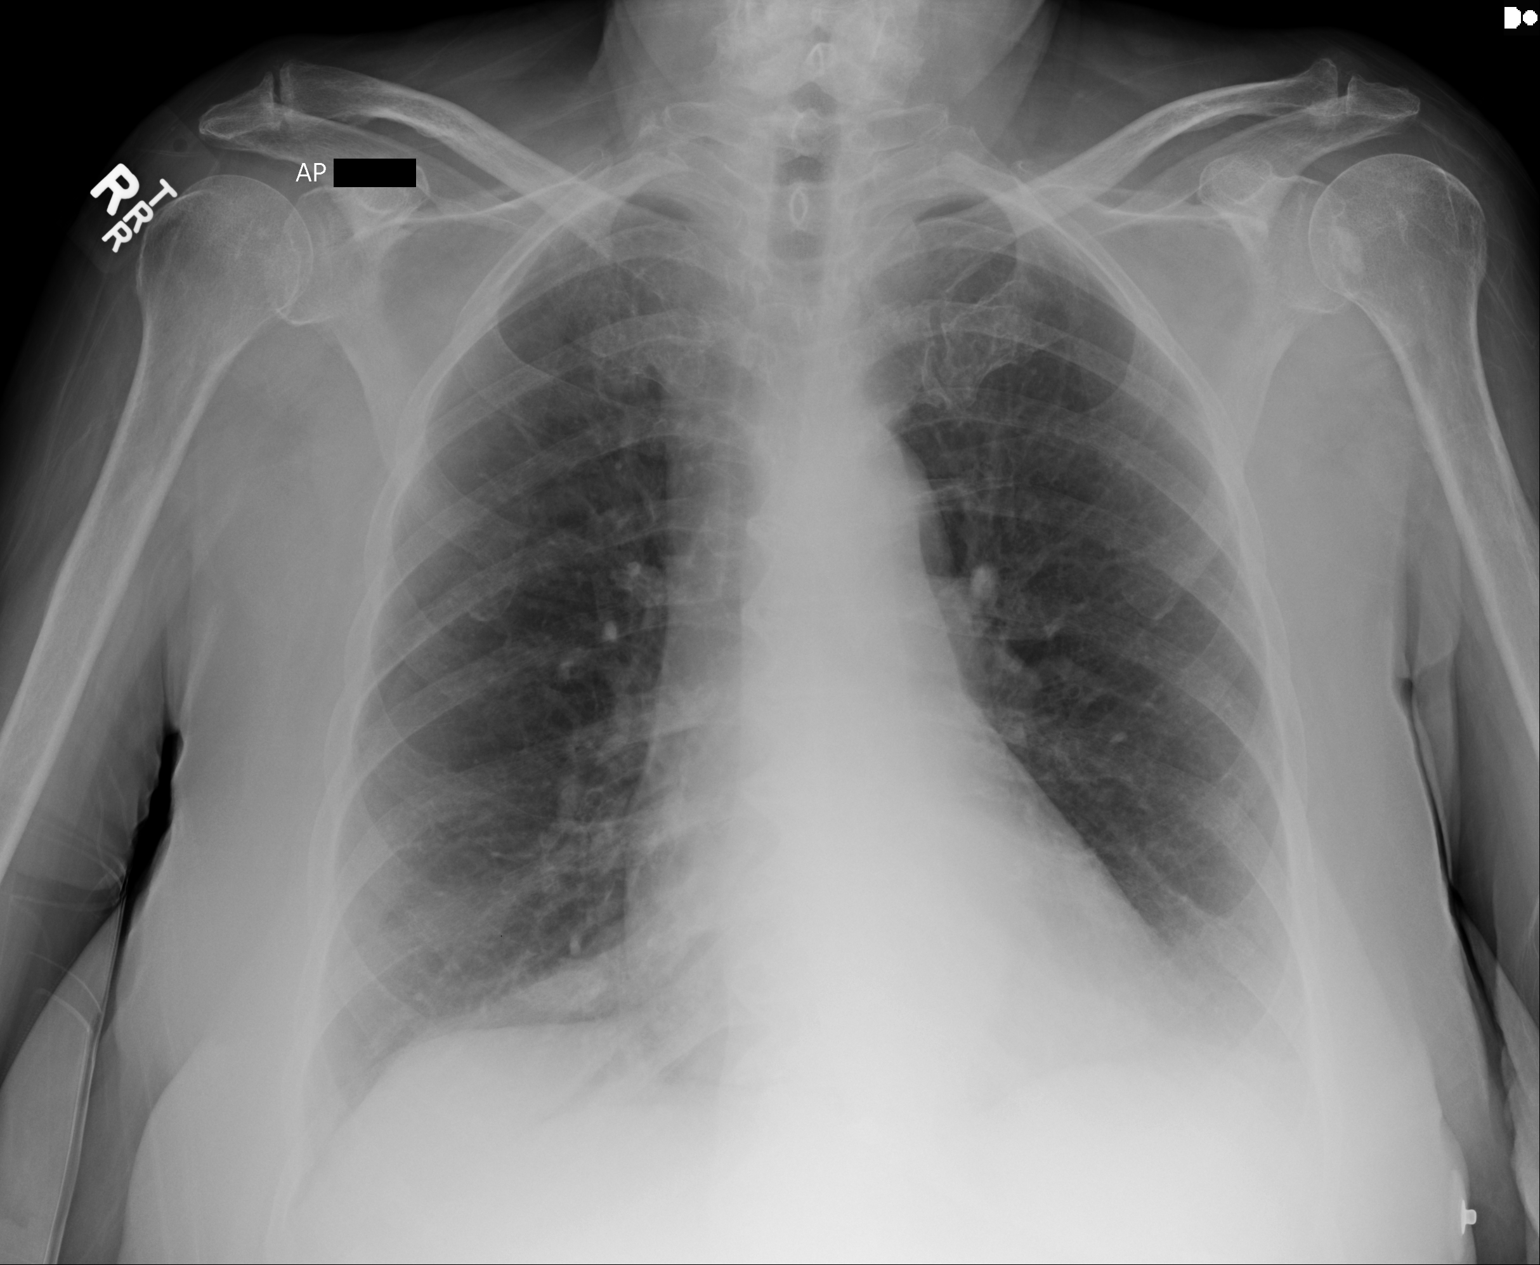

[2 of 2 positions shown; findings below may reference images not displayed]

FINDINGS: PA and lateral chest radiographs are provided.  There is no focal
parenchymal opacity, pleural effusion, or pneumothorax. The heart and
mediastinum are unremarkable.  The osseous structures are unremarkable.
IMPRESSION: No acute disease of the che[REDACTED]

## 2014-03-01 ENCOUNTER — Other Ambulatory Visit: Payer: Self-pay | Admitting: *Deleted

## 2014-03-01 MED ORDER — LISINOPRIL 40 MG PO TABS: ORAL_TABLET | ORAL | Status: DC

## 2014-04-13 ENCOUNTER — Other Ambulatory Visit: Payer: Self-pay | Admitting: Internal Medicine

## 2014-04-14 ENCOUNTER — Telehealth: Payer: Self-pay | Admitting: *Deleted

## 2014-04-14 NOTE — Telephone Encounter (Signed)
Please advise 

## 2014-04-14 NOTE — Telephone Encounter (Signed)
Pt called requesting to speak with the nurse, pt said she has been taking her blood pressure and it has been running low, pt is concerned if she needs to continue to take her blood pressure medication, pt's blood pressure has been reading 129/66 this am. Last night 110/67, 3 nights ago is read as 90/55. Please advise [phone]

## 2014-04-15 NOTE — Telephone Encounter (Signed)
Left message for pt to return my call.

## 2014-04-15 NOTE — Telephone Encounter (Signed)
Pt notified and verbalized understanding.

## 2014-04-15 NOTE — Telephone Encounter (Signed)
Stop the lisinopril.  Continue the losartan/hctz.  Call back next week if bp is still < 100/70

## 2014-04-30 ENCOUNTER — Other Ambulatory Visit: Payer: Self-pay | Admitting: Internal Medicine

## 2014-05-04 ENCOUNTER — Other Ambulatory Visit: Payer: Self-pay | Admitting: *Deleted

## 2014-05-04 MED ORDER — ATORVASTATIN CALCIUM 80 MG PO TABS: 80.0000 mg | ORAL_TABLET | Freq: Every day | ORAL | Status: DC

## 2014-05-04 NOTE — Progress Notes (Signed)
Pharmacy requesting script atorvastatin

## 2014-05-05 NOTE — Progress Notes (Signed)
Faxed to pharmacy

## 2014-05-11 ENCOUNTER — Other Ambulatory Visit: Payer: Self-pay | Admitting: Internal Medicine

## 2014-07-11 ENCOUNTER — Ambulatory Visit: Payer: 59 | Admitting: Internal Medicine

## 2014-07-19 ENCOUNTER — Other Ambulatory Visit: Payer: Self-pay | Admitting: Internal Medicine

## 2014-08-10 ENCOUNTER — Encounter: Payer: Self-pay | Admitting: Internal Medicine

## 2014-08-10 ENCOUNTER — Ambulatory Visit (INDEPENDENT_AMBULATORY_CARE_PROVIDER_SITE_OTHER): Payer: Medicare Other | Admitting: Internal Medicine

## 2014-08-10 VITALS — BP 108/70 | HR 58 | Temp 97.4°F | Resp 12 | Ht 65.0 in | Wt 176.8 lb

## 2014-08-10 DIAGNOSIS — E119 Type 2 diabetes mellitus without complications: Secondary | ICD-10-CM | POA: Diagnosis not present

## 2014-08-10 DIAGNOSIS — D519 Vitamin B12 deficiency anemia, unspecified: Secondary | ICD-10-CM

## 2014-08-10 DIAGNOSIS — F418 Other specified anxiety disorders: Secondary | ICD-10-CM | POA: Diagnosis not present

## 2014-08-10 DIAGNOSIS — E559 Vitamin D deficiency, unspecified: Secondary | ICD-10-CM | POA: Diagnosis not present

## 2014-08-10 DIAGNOSIS — R5383 Other fatigue: Secondary | ICD-10-CM

## 2014-08-10 DIAGNOSIS — Z23 Encounter for immunization: Secondary | ICD-10-CM

## 2014-08-10 DIAGNOSIS — Z720 Tobacco use: Secondary | ICD-10-CM

## 2014-08-10 DIAGNOSIS — I10 Essential (primary) hypertension: Secondary | ICD-10-CM

## 2014-08-10 DIAGNOSIS — E785 Hyperlipidemia, unspecified: Secondary | ICD-10-CM | POA: Diagnosis not present

## 2014-08-10 DIAGNOSIS — E538 Deficiency of other specified B group vitamins: Secondary | ICD-10-CM

## 2014-08-10 MED ORDER — ATENOLOL 25 MG PO TABS: 25.0000 mg | ORAL_TABLET | Freq: Two times a day (BID) | ORAL | Status: DC

## 2014-08-10 MED ORDER — LAMOTRIGINE 25 MG PO TABS: 25.0000 mg | ORAL_TABLET | Freq: Every day | ORAL | Status: DC

## 2014-08-10 MED ORDER — PREDNISONE 10 MG PO TABS: ORAL_TABLET | ORAL | Status: DC

## 2014-08-10 NOTE — Patient Instructions (Signed)
I am adding  LAMICTAL TO TAKE AT BEDTIME TO HELP CONTROL YOUR MOOD  CONTINUE TAKING SERTRALINE   I am lowering your atenolol dose ot 25 mg twice daily

## 2014-08-10 NOTE — Progress Notes (Signed)
Patient ID: Monica [name], female   DOB: 1929-10-20, 81 y.o.   MRN: [id number]   Patient Active Problem List   Diagnosis Date Noted  . Depression with anxiety 10/10/2013  . Vitamin D deficiency 10/08/2013  . Bilateral chronic knee pain 09/21/2011  . Tobacco abuse counseling 07/08/2011  . Incontinence of urine 06/13/2011  . Diabetes mellitus without complication [id number]  . Tobacco abuse disorder 06/11/2011  . Hyperlipidemia   . Hypertension   . COPD (chronic obstructive pulmonary disease)   . B12 deficiency   . Lung mass     Subjective:  CC:   Chief Complaint  Patient presents with  . Follow-up    mood swings  . Diabetes    low bp readings at home.    HPI:   Monica [name] is a 81 y.o. female who presents for  Follow up on chronic medical issues including type 2 DM,  Depressive disorder NOS.  Hypertension hyperlipidemia, and tobacco abuse,  She is accompanied by her daughter today.  Her mood is down.  She is very unhappy in her marital relationship of over 40 years. Stats that she hates her husband at times,  Who is very sedentary adn demanding of her time due ot history of OPD,  Bladder cancer,  Acocording to daughter, the patient has been having increased irritability and mood swings,   That are directed at the entire house.  .  Per daughter she is "hateful"  And   Stays in her room a lot  She denies uncontrolled pain and labile blood sugars as contributors.. Her Knee pain controlled with vicodin   Her log of blood sugars shows reasonable control .  She is smoking 6 cigs /day    Past Medical History  Diagnosis Date  . Diabetes mellitus   . Hyperlipidemia   . Hypertension   . COPD (chronic obstructive pulmonary disease)   . B12 deficiency   . H/O: Bell's palsy   . Lung mass April 2011    s/p biopsy, no cancer cells found, Repeat CT Dec 2012 unchanged    Past Surgical History  Procedure Laterality Date  . Cholecystectomy    . Spine surgery  2001    Lumbar  .  Abdominal hysterectomy    . Lung biopsy  2012    for positive PET, negative biopsy for malignancy    . cyanocobalamin  1,000 mcg Intramuscular Once     The following portions of the patient's history were reviewed and updated as appropriate: Allergies, current medications, and problem list.    Review of Systems:   Patient denies headache, fevers, malaise, unintentional weight loss, skin rash, eye pain, sinus congestion and sinus pain, sore throat, dysphagia,  hemoptysis , cough, dyspnea, wheezing, chest pain, palpitations, orthopnea, edema, abdominal pain, nausea, melena, diarrhea, constipation, flank pain, dysuria, hematuria, urinary  Frequency, nocturia, numbness, tingling, seizures,  Focal weakness, Loss of consciousness,  Tremor, insomnia, depression, anxiety, and suicidal ideation.     History   Social History  . Marital Status: Married    Spouse Name: N/A  . Number of Children: N/A  . Years of Education: N/A   Occupational History  . Not on file.   Social History Main Topics  . Smoking status: Current Every Day Smoker -- 0.50 packs/day for 5 years    Types: Cigarettes  . Smokeless tobacco: Never Used  . Alcohol Use: No  . Drug Use: No  . Sexual Activity: Not Currently   Other Topics  Concern  . Not on file   Social History Narrative    Objective:  Filed Vitals:   08/10/14 1522  BP: 108/70  Pulse: 58  Temp: 97.4 F (36.3 C)  Resp: 12     General appearance: alert, cooperative and appears stated age Ears: normal TM's and external ear canals both ears Throat: lips, mucosa, and tongue normal; teeth and gums normal Neck: no adenopathy, no carotid bruit, supple, symmetrical, trachea midline and thyroid not enlarged, symmetric, no tenderness/mass/nodules Back: symmetric, no curvature. ROM normal. No CVA tenderness. Lungs: clear to auscultation bilaterally Heart: regular rate and rhythm, S1, S2 normal, no murmur, click, rub or gallop Abdomen: soft,  non-tender; bowel sounds normal; no masses,  no organomegaly Pulses: 2+ and symmetric Skin: Skin color, texture, turgor normal. No rashes or lesions Lymph nodes: Cervical, supraclavicular, and axillary nodes normal.  Assessment and Plan:  Depression with anxiety Adding lamictal for mood swings  Concerning for hypomania; continue sertraline.  Asked to return in one month .  Spent 20 minutes providing counseling on productive ways to manage marital discord.     Tobacco abuse disorder The patient was counseled on the dangers of tobacco use, and was advised to quit.  Reviewed strategies to maximize success, including stress management.   Diabetes mellitus without complication Her diabetes has ben historically well-controlled on current medications until currently.. Patient  Will need to increases her insulin at bedtime to 20 units and start addig 5 units in the morningg if her fasting  Sugar is > 150/.  is up-to-date on eye exams and foot exam is normal today. Patient is due for urine microalbumin to creatinine ratio at next visit. Patient is tolerating statin therapy for CAD risk reduction and on ACE/ARB for reduction in proteinuria.    Lab Results  Component Value Date   HGBA1C 7.9* 08/10/2014   Lab Results  Component Value Date   MICROALBUR 1.6 01/06/2014        Hyperlipidemia Managed with high potency statin therapy.   Liver enzymes are normal , no changes today.  Lab Results  Component Value Date   CHOL 224* 08/10/2014   HDL 41.90 08/10/2014   LDLCALC 156* 08/10/2014   LDLDIRECT 59.1 09/17/2011   TRIG 133.0 08/10/2014   CHOLHDL 5 08/10/2014   Lab Results  Component Value Date   ALT 9 08/10/2014   AST 16 08/10/2014   ALKPHOS 71 08/10/2014   BILITOT 0.6 08/10/2014        Hypertension Well controlled on current regimen. Renal function stable,  reducig atenolol dose today  For persistent bradycardia     Updated Medication List Outpatient Encounter  Prescriptions as of 08/10/2014  Medication Sig  . acetaminophen (TYLENOL) 500 MG tablet Take 500 mg by mouth as needed.    Marland Kitchen atenolol (TENORMIN) 25 MG tablet Take 1 tablet (25 mg total) by mouth 2 (two) times daily.  Marland Kitchen atorvastatin (LIPITOR) 80 MG tablet Take 1 tablet (80 mg total) by mouth daily.  . B-D INS SYR ULTRAFINE .3CC/30G 30G X 1/2" 0.3 ML MISC USE TO INJECT 15 UNITS SUBQ AT BEDTIME  . CLEVER CHEK AUTO-CODE VOICE test strip USE 1 TIME DAILY FOR DIABETIC TESTING  . CRESTOR 20 MG tablet TAKE ONE (1) TABLET BY MOUTH EVERY DAY  . cyanocobalamin (,VITAMIN B-12,) 1000 MCG/ML injection Inject 1 mL (1,000 mcg total) into the muscle once. I ml IM injection weekly x 3, then monthly  . ergocalciferol (DRISDOL) 50000 UNITS capsule Take 1 capsule (  50,000 Units total) by mouth once a week.  Marland Kitchen glipiZIDE (GLUCOTROL) 5 MG tablet TAKE ONE TABLET TWICE DAILY AFTER MEALS  . HYDROcodone-acetaminophen (NORCO/VICODIN) 5-325 MG per tablet Take 1 tablet by mouth daily as needed for pain.  Marland Kitchen ibuprofen (ADVIL,MOTRIN) 200 MG tablet Take 200 mg by mouth every 6 (six) hours as needed for pain.  . Incontinence Supply Disposable (DISPOSABLE BRIEF LARGE) MISC 1 application by Does not apply route 2 (two) times daily.  . insulin NPH Human (HUMULIN N) 100 UNIT/ML injection 20 units a bedtime and 5 units in am unless cbg is < 150  . lamoTRIgine (LAMICTAL) 25 MG tablet Take 1 tablet (25 mg total) by mouth daily. AT BEDTIME  . Lancets MISC USE 1 TIME DAILY FOR DIABETIC TESTING  . lisinopril (PRINIVIL,ZESTRIL) 40 MG tablet TAKE ONE (1) TABLET EACH DAY  . losartan-hydrochlorothiazide (HYZAAR) 100-25 MG per tablet TAKE ONE (1) TABLET BY MOUTH EVERY DAY  . meloxicam (MOBIC) 15 MG tablet Take 1 tablet (15 mg total) by mouth daily.  . metFORMIN (GLUCOPHAGE) 850 MG tablet TAKE ONE TABLET BY MOUTH TWICE DAILY WITH MEALS  . predniSONE (DELTASONE) 10 MG tablet 6 tablets on Day 1 , then reduce by 1 tablet daily until gone.  START ON  sATURDAY FEB 20TH  . TDaP (BOOSTRIX) 5-2.5-18.5 LF-MCG/0.5 injection Inject 0.5 mLs into the muscle once.  . Tdap (BOOSTRIX) 5-2.5-18.5 LF-MCG/0.5 injection Inject 0.5 mLs into the muscle once.  . [DISCONTINUED] atenolol (TENORMIN) 50 MG tablet TAKE ONE (1) TABLET BY MOUTH TWO (2) TIMES DAILY  . [DISCONTINUED] insulin NPH Human (HUMULIN N) 100 UNIT/ML injection Inject 0.15 mLs (15 Units total) into the skin at bedtime.  . [DISCONTINUED] sertraline (ZOLOFT) 100 MG tablet TAKE ONE TABLET DAILY AT BEDTIME     Orders Placed This Encounter  Procedures  . Flu Vaccine QUAD 36+ mos IM  . Pneumococcal conjugate vaccine 13-valent  . CBC with Differential/Platelet  . Hemoglobin A1c  . Comprehensive metabolic panel  . Lipid panel  . B12  . Vit D  25 hydroxy (rtn osteoporosis monitoring)    Return in about 4 weeks (around 09/07/2014).

## 2014-08-10 NOTE — Assessment & Plan Note (Addendum)
Adding lamictal for mood swings  Concerning for hypomania; continue sertraline.  Asked to return in one month .  Spent 20 minutes providing counseling on productive ways to manage marital discord.

## 2014-08-11 LAB — VITAMIN B12: Vitamin B-12: 318 pg/mL (ref 211–911)

## 2014-08-11 LAB — COMPREHENSIVE METABOLIC PANEL
ALK PHOS: 71 U/L (ref 39–117)
ALT: 9 U/L (ref 0–35)
AST: 16 U/L (ref 0–37)
Albumin: 3.7 g/dL (ref 3.5–5.2)
BILIRUBIN TOTAL: 0.6 mg/dL (ref 0.2–1.2)
BUN: 31 mg/dL — ABNORMAL HIGH (ref 6–23)
CO2: 24 mEq/L (ref 19–32)
Calcium: 9.9 mg/dL (ref 8.4–10.5)
Chloride: 105 mEq/L (ref 96–112)
Creatinine, Ser: 1.48 mg/dL — ABNORMAL HIGH (ref 0.40–1.20)
GFR: 35.84 mL/min — AB (ref >60.00)
Glucose, Bld: 153 mg/dL — ABNORMAL HIGH (ref 70–99)
Potassium: 4.1 mEq/L (ref 3.5–5.1)
Sodium: 138 mEq/L (ref 135–145)
Total Protein: 7.4 g/dL (ref 6.0–8.3)

## 2014-08-11 LAB — HEMOGLOBIN A1C: Hgb A1c MFr Bld: 7.9 % — ABNORMAL HIGH (ref 4.6–6.5)

## 2014-08-11 LAB — LIPID PANEL
CHOL/HDL RATIO: 5
Cholesterol: 224 mg/dL — ABNORMAL HIGH (ref 0–200)
HDL: 41.9 mg/dL (ref >39.00)
LDL Cholesterol: 156 mg/dL — ABNORMAL HIGH (ref 0–99)
NONHDL: 182.1
Triglycerides: 133 mg/dL (ref 0.0–149.0)
VLDL: 26.6 mg/dL (ref 0.0–40.0)

## 2014-08-11 LAB — CBC WITH DIFFERENTIAL/PLATELET
Basophils Absolute: 0.1 10*3/uL (ref 0.0–0.1)
Basophils Relative: 0.7 % (ref 0.0–3.0)
EOS PCT: 6.8 % — AB (ref 0.0–5.0)
Eosinophils Absolute: 0.6 10*3/uL (ref 0.0–0.7)
HCT: 43.2 % (ref 36.0–46.0)
Hemoglobin: 14.4 g/dL (ref 12.0–15.0)
LYMPHS PCT: 18 % (ref 12.0–46.0)
Lymphs Abs: 1.6 10*3/uL (ref 0.7–4.0)
MCHC: 33.3 g/dL (ref 30.0–36.0)
MCV: 85.3 fl (ref 78.0–100.0)
MONO ABS: 0.5 10*3/uL (ref 0.1–1.0)
Monocytes Relative: 5.8 % (ref 3.0–12.0)
NEUTROS PCT: 68.7 % (ref 43.0–77.0)
Neutro Abs: 6 10*3/uL (ref 1.4–7.7)
Platelets: 311 10*3/uL (ref 150.0–400.0)
RBC: 5.07 Mil/uL (ref 3.87–5.11)
RDW: 15.1 % (ref 11.5–15.5)
WBC: 8.7 10*3/uL (ref 4.0–10.5)

## 2014-08-11 LAB — VITAMIN D 25 HYDROXY (VIT D DEFICIENCY, FRACTURES): VITD: 24.98 ng/mL — AB (ref 30.00–100.00)

## 2014-08-12 ENCOUNTER — Telehealth: Payer: Self-pay | Admitting: Internal Medicine

## 2014-08-12 ENCOUNTER — Other Ambulatory Visit: Payer: Self-pay | Admitting: Internal Medicine

## 2014-08-12 NOTE — Telephone Encounter (Signed)
Patient called stated BP on 08/11/14 at home was 99/68 HR 72, patient is to take BP today and call back with reading patient ask should she continue to take BP medications with BP this low? Patient current medications are Atenolol 25 mg bid which was reduced by MD on 08/10/14 patient also taking Lisinopril 40 mg daily and losartan/HCTZ 100-25 mg daily. Patient return call with BP of 138/80 HR 82 patient wanted to know should she continue all 3 meds. Advised patient I would call with directions.

## 2014-08-12 NOTE — Telephone Encounter (Signed)
Given bp 138/80 - would remain on her current regimen.  Continue to monitor blood pressures and call in readings.  Any questions - call

## 2014-08-12 NOTE — Telephone Encounter (Signed)
Patient notified and voiced understanding.

## 2014-08-14 ENCOUNTER — Encounter: Payer: Self-pay | Admitting: Internal Medicine

## 2014-08-14 MED ORDER — INSULIN NPH (HUMAN) (ISOPHANE) 100 UNIT/ML ~~LOC~~ SUSP: SUBCUTANEOUS | Status: DC

## 2014-08-14 NOTE — Assessment & Plan Note (Signed)
The patient was counseled on the dangers of tobacco use, and was advised to quit.  Reviewed strategies to maximize success, including stress management.

## 2014-08-14 NOTE — Assessment & Plan Note (Signed)
Managed with high potency statin therapy.   Liver enzymes are normal , no changes today.  Lab Results  Component Value Date   CHOL 224* 08/10/2014   HDL 41.90 08/10/2014   LDLCALC 156* 08/10/2014   LDLDIRECT 59.1 09/17/2011   TRIG 133.0 08/10/2014   CHOLHDL 5 08/10/2014   Lab Results  Component Value Date   ALT 9 08/10/2014   AST 16 08/10/2014   ALKPHOS 71 08/10/2014   BILITOT 0.6 08/10/2014

## 2014-08-14 NOTE — Assessment & Plan Note (Signed)
Her diabetes has ben historically well-controlled on current medications until currently.. Patient  Will need to increases her insulin at bedtime to 20 units and start addig 5 units in the morningg if her fasting  Sugar is > 150/.  is up-to-date on eye exams and foot exam is normal today. Patient is due for urine microalbumin to creatinine ratio at next visit. Patient is tolerating statin therapy for CAD risk reduction and on ACE/ARB for reduction in proteinuria.    Lab Results  Component Value Date   HGBA1C 7.9* 08/10/2014   Lab Results  Component Value Date   MICROALBUR 1.6 01/06/2014

## 2014-08-14 NOTE — Assessment & Plan Note (Signed)
Well controlled on current regimen. Renal function stable,  reducig atenolol dose today  For persistent bradycardia

## 2014-09-06 ENCOUNTER — Ambulatory Visit: Payer: Medicare Other | Admitting: Internal Medicine

## 2014-10-10 ENCOUNTER — Other Ambulatory Visit: Payer: Self-pay | Admitting: Internal Medicine

## 2014-12-08 ENCOUNTER — Other Ambulatory Visit: Payer: Self-pay | Admitting: Internal Medicine

## 2015-02-20 ENCOUNTER — Other Ambulatory Visit: Payer: Self-pay | Admitting: Internal Medicine

## 2015-03-17 ENCOUNTER — Encounter: Payer: Self-pay | Admitting: Internal Medicine

## 2015-03-17 ENCOUNTER — Ambulatory Visit (INDEPENDENT_AMBULATORY_CARE_PROVIDER_SITE_OTHER): Payer: Medicare Other | Admitting: Internal Medicine

## 2015-03-17 VITALS — BP 130/72 | HR 73 | Temp 97.8°F | Resp 14 | Ht 65.0 in | Wt 179.8 lb

## 2015-03-17 DIAGNOSIS — I10 Essential (primary) hypertension: Secondary | ICD-10-CM

## 2015-03-17 DIAGNOSIS — E1169 Type 2 diabetes mellitus with other specified complication: Secondary | ICD-10-CM

## 2015-03-17 DIAGNOSIS — R5383 Other fatigue: Secondary | ICD-10-CM

## 2015-03-17 DIAGNOSIS — Z716 Tobacco abuse counseling: Secondary | ICD-10-CM

## 2015-03-17 DIAGNOSIS — F339 Major depressive disorder, recurrent, unspecified: Secondary | ICD-10-CM

## 2015-03-17 DIAGNOSIS — E785 Hyperlipidemia, unspecified: Secondary | ICD-10-CM

## 2015-03-17 DIAGNOSIS — E119 Type 2 diabetes mellitus without complications: Secondary | ICD-10-CM | POA: Diagnosis not present

## 2015-03-17 DIAGNOSIS — J431 Panlobular emphysema: Secondary | ICD-10-CM

## 2015-03-17 DIAGNOSIS — E559 Vitamin D deficiency, unspecified: Secondary | ICD-10-CM

## 2015-03-17 LAB — LIPID PANEL
CHOLESTEROL: 157 mg/dL (ref 125–200)
HDL: 45 mg/dL — AB (ref >=46)
LDL CALC: 90 mg/dL (ref <130)
TRIGLYCERIDES: 112 mg/dL (ref <150)
Total CHOL/HDL Ratio: 3.5 Ratio (ref <=5.0)
VLDL: 22 mg/dL (ref <30)

## 2015-03-17 LAB — COMPREHENSIVE METABOLIC PANEL
ALT: 9 U/L (ref 6–29)
AST: 14 U/L (ref 10–35)
Albumin: 3.9 g/dL (ref 3.6–5.1)
Alkaline Phosphatase: 64 U/L (ref 33–130)
BUN: 18 mg/dL (ref 7–25)
CO2: 24 mmol/L (ref 20–31)
CREATININE: 1.18 mg/dL — AB (ref 0.60–0.88)
Calcium: 9.5 mg/dL (ref 8.6–10.4)
Chloride: 105 mmol/L (ref 98–110)
GLUCOSE: 121 mg/dL — AB (ref 65–99)
Potassium: 4.4 mmol/L (ref 3.5–5.3)
SODIUM: 140 mmol/L (ref 135–146)
TOTAL PROTEIN: 6.7 g/dL (ref 6.1–8.1)
Total Bilirubin: 0.6 mg/dL (ref 0.2–1.2)

## 2015-03-17 LAB — TSH: TSH: 4.355 u[IU]/mL (ref 0.350–4.500)

## 2015-03-17 LAB — HEMOGLOBIN A1C
HEMOGLOBIN A1C: 6.8 % — AB (ref <5.7)
Mean Plasma Glucose: 148 mg/dL — ABNORMAL HIGH (ref <117)

## 2015-03-17 LAB — LDL CHOLESTEROL, DIRECT: Direct LDL: 99 mg/dL (ref <130)

## 2015-03-17 MED ORDER — TRAZODONE HCL 50 MG PO TABS: 25.0000 mg | ORAL_TABLET | Freq: Every evening | ORAL | Status: DC | PRN

## 2015-03-17 MED ORDER — LAMOTRIGINE 25 MG PO TABS: 50.0000 mg | ORAL_TABLET | Freq: Every day | ORAL | Status: DC

## 2015-03-17 NOTE — Progress Notes (Signed)
Pre visit review using our clinic review tool, if applicable. No additional management support is needed unless otherwise documented below in the visit note. 

## 2015-03-17 NOTE — Patient Instructions (Addendum)
Cut your glipizide  dose in half and continue 1/2 tablet twice daily   I am increasing your lamictal to 50 mg and I want you to take it at  9 pm ,     Adding trazodone for insomnia, Starting with 1/2 tablet at 9 pm  .  If still wide awake by 10 pm take another 1/2 tablet   Set your alarm for 8 am ,  And get up and get some morning sunshine 20 minutes    You should take your atorvastatin at bedtime to avoid nausea .  If it still does,  Call and we will tyr crestor again once generic is available    Return in one month

## 2015-03-17 NOTE — Progress Notes (Signed)
Subjective:  Patient ID: Monica [name], female    DOB: 22-Mar-1930  Age: 81 y.o. MRN: [id number]  CC: The primary encounter diagnosis was Vitamin D deficiency. Diagnoses of Hyperlipidemia associated with type 2 diabetes mellitus, Diabetes mellitus without complication, Other fatigue, Major depressive disorder, recurrent episode with anxious distress, Tobacco abuse counseling, Hyperlipidemia, Essential hypertension, and Panlobular emphysema were also pertinent to this visit.  HPI Monica CANEPA presents for 6 month follow up on diet controlled DM, hypertension, COPD and major depressive disorder.  She is accompanied by her daughter and grandchild.  Her daughter reports that she has been more depressed,  Sleeping to 2 pm,  Having Mood swings, and has threatened to kill herself several times,  But has not acted on her threats to overdose on her medications.  The episodes usually occur after patient and daughter quarrel.  Daughter is unemployed and living with patient along with her son, and attributes the fighting to financial strains.   DM:  She has been having recurrent lows to 78  No highs   Blood sugar log reviewed  She eats breakfast at 2 pm,  Next meal is 9 PM   Not tolerating lipitor due to nausea   Foot exam normal   Flu shot accepted   Outpatient Prescriptions Prior to Visit  Medication Sig Dispense Refill  . ACCU-CHEK SMARTVIEW test strip USE 2 TIMES DAILY FOR DIABETIC TESTING 200 each 0  . acetaminophen (TYLENOL) 500 MG tablet Take 500 mg by mouth as needed.      Marland Kitchen atenolol (TENORMIN) 25 MG tablet TAKE ONE TABLET TWICE DAILY 60 tablet 5  . atorvastatin (LIPITOR) 80 MG tablet Take 1 tablet (80 mg total) by mouth daily. 90 tablet 3  . B-D INS SYR ULTRAFINE .3CC/30G 30G X 1/2" 0.3 ML MISC USE TO INJECT 15 UNITS SUBQ AT BEDTIME 100 each 3  . CRESTOR 20 MG tablet TAKE ONE (1) TABLET BY MOUTH EVERY DAY 30 tablet 6  . cyanocobalamin (,VITAMIN B-12,) 1000 MCG/ML injection Inject 1 mL (1,000  mcg total) into the muscle once. I ml IM injection weekly x 3, then monthly 10 mL 0  . ergocalciferol (DRISDOL) 50000 UNITS capsule Take 1 capsule (50,000 Units total) by mouth once a week. 12 capsule 0  . glipiZIDE (GLUCOTROL) 5 MG tablet TAKE ONE TABLET TWICE DAILY AFTER MEALS 180 tablet 1  . HYDROcodone-acetaminophen (NORCO/VICODIN) 5-325 MG per tablet Take 1 tablet by mouth daily as needed for pain. 30 tablet 3  . ibuprofen (ADVIL,MOTRIN) 200 MG tablet Take 200 mg by mouth every 6 (six) hours as needed for pain.    . Incontinence Supply Disposable (DISPOSABLE BRIEF LARGE) MISC 1 application by Does not apply route 2 (two) times daily. 36 each 12  . insulin NPH Human (HUMULIN N) 100 UNIT/ML injection 20 units a bedtime and 5 units in am unless cbg is < 150 1 vial 12  . lisinopril (PRINIVIL,ZESTRIL) 40 MG tablet TAKE ONE (1) TABLET EACH DAY 90 tablet 1  . losartan-hydrochlorothiazide (HYZAAR) 100-25 MG per tablet TAKE ONE (1) TABLET BY MOUTH EVERY DAY 30 tablet 6  . meloxicam (MOBIC) 15 MG tablet Take 1 tablet (15 mg total) by mouth daily. 90 tablet 1  . metFORMIN (GLUCOPHAGE) 850 MG tablet TAKE ONE TABLET BY MOUTH TWICE DAILY WITH MEALS 60 tablet 5  . PHARMACIST CHOICE LANCETS MISC USE 2 TIMES DAILY FOR DIABETIC TESTING 200 each 0  . predniSONE (DELTASONE) 10 MG tablet 6 tablets  on Day 1 , then reduce by 1 tablet daily until gone.  START ON sATURDAY FEB 20TH 21 tablet 0  . sertraline (ZOLOFT) 100 MG tablet TAKE ONE TABLET BY MOUTH EVERY NIGHT AT BEDTIME 30 tablet 2  . lamoTRIgine (LAMICTAL) 25 MG tablet Take 1 tablet (25 mg total) by mouth daily. AT BEDTIME 30 tablet 2  . TDaP (BOOSTRIX) 5-2.5-18.5 LF-MCG/0.5 injection Inject 0.5 mLs into the muscle once. (Patient not taking: Reported on 03/17/2015) 0.5 mL 0  . Tdap (BOOSTRIX) 5-2.5-18.5 LF-MCG/0.5 injection Inject 0.5 mLs into the muscle once. (Patient not taking: Reported on 03/17/2015) 0.5 mL 0   Facility-Administered Medications Prior to Visit   Medication Dose Route Frequency Provider Last Rate Last Dose  . cyanocobalamin ((VITAMIN B-12)) injection 1,000 mcg  1,000 mcg Intramuscular Once Monica Mc, MD        Review of Systems;  Patient denies headache, fevers, malaise, unintentional weight loss, skin rash, eye pain, sinus congestion and sinus pain, sore throat, dysphagia,  hemoptysis , cough, dyspnea, wheezing, chest pain, palpitations, orthopnea, edema, abdominal pain, nausea, melena, diarrhea, constipation, flank pain, dysuria, hematuria, urinary  Frequency, nocturia, numbness, tingling, seizures,  Focal weakness, Loss of consciousness,  Tremor, insomnia, depression, anxiety, and suicidal ideation.      Objective:  BP 130/72 mmHg  Pulse 73  Temp(Src) 97.8 F (36.6 C) (Oral)  Resp 14  Ht 5\' 5"  (1.651 m)  Wt 179 lb 12.8 oz (81.557 kg)  BMI 29.92 kg/m2  SpO2 95%  BP Readings from Last 3 Encounters:  03/17/15 130/72  08/10/14 108/70  01/06/14 136/84    Wt Readings from Last 3 Encounters:  03/17/15 179 lb 12.8 oz (81.557 kg)  08/10/14 176 lb 12 oz (80.173 kg)  01/06/14 176 lb 12 oz (80.173 kg)    General appearance: alert, cooperative and appears stated age Ears: normal TM's and external ear canals both ears Throat: lips, mucosa, and tongue normal; teeth and gums normal Neck: no adenopathy, no carotid bruit, supple, symmetrical, trachea midline and thyroid not enlarged, symmetric, no tenderness/mass/nodules Back: symmetric, no curvature. ROM normal. No CVA tenderness. Lungs: clear to auscultation bilaterally Heart: regular rate and rhythm, S1, S2 normal, no murmur, click, rub or gallop Abdomen: soft, non-tender; bowel sounds normal; no masses,  no organomegaly Pulses: 2+ and symmetric Skin: Skin color, texture, turgor normal. No rashes or lesions Lymph nodes: Cervical, supraclavicular, and axillary nodes normal.  Lab Results  Component Value Date   HGBA1C 6.8* 03/17/2015   HGBA1C 7.9* 08/10/2014   HGBA1C  7.0* 01/06/2014    Lab Results  Component Value Date   CREATININE 1.18* 03/17/2015   CREATININE 1.48* 08/10/2014   CREATININE 1.2 01/06/2014    Lab Results  Component Value Date   WBC 8.7 08/10/2014   HGB 14.4 08/10/2014   HCT 43.2 08/10/2014   PLT 311.0 08/10/2014   GLUCOSE 121* 03/17/2015   CHOL 157 03/17/2015   TRIG 112 03/17/2015   HDL 45* 03/17/2015   LDLDIRECT 99 03/17/2015   LDLCALC 90 03/17/2015   ALT 9 03/17/2015   AST 14 03/17/2015   NA 140 03/17/2015   K 4.4 03/17/2015   CL 105 03/17/2015   CREATININE 1.18* 03/17/2015   BUN 18 03/17/2015   CO2 24 03/17/2015   TSH 4.355 03/17/2015   HGBA1C 6.8* 03/17/2015   MICROALBUR 1.3 03/17/2015    No results found.  Assessment & Plan:   Problem List Items Addressed This Visit  Unprioritized   Diabetes mellitus without complication    Her diabetes remains well-controlled on current medications but she is having hypoglycemic events due to anorexia and sleep schedule.  We are reducing her glipizide to 5 mg twice daily.  She  is up-to-date on eye exams and foot exam is normal today.  Patient is tolerating statin therapy for CAD risk reduction and on ACE/ARB for reduction in proteinuria.    Lab Results  Component Value Date   HGBA1C 6.8* 03/17/2015   Lab Results  Component Value Date   MICROALBUR 1.3 03/17/2015             Relevant Orders   Comprehensive metabolic panel (Completed)   Hemoglobin A1c (Completed)   LDL cholesterol, direct (Completed)   Lipid panel (Completed)   Microalbumin / creatinine urine ratio (Completed)   Hyperlipidemia    She is not tolerating atorvastatin due to nausea but is taking it in the am. Advised to change to evening dosing, and will change to generic Crestor if she continues to report nausea.   Lab Results  Component Value Date   CHOL 157 03/17/2015   HDL 45* 03/17/2015   LDLCALC 90 03/17/2015   LDLDIRECT 99 03/17/2015   TRIG 112 03/17/2015   CHOLHDL 3.5  03/17/2015         Hypertension    Well controlled on current regimen. Renal function stable, no changes today.  Lab Results  Component Value Date   CREATININE 1.18* 03/17/2015   Lab Results  Component Value Date   NA 140 03/17/2015   K 4.4 03/17/2015   CL 105 03/17/2015   CO2 24 03/17/2015         COPD (chronic obstructive pulmonary disease)    Secondary to tobacco abuse , ongoing.  Currently asymptomatic.       Tobacco abuse counseling    Smoking cessation instruction/counseling given:  counseled patient on the dangers of tobacco use, advised patient to stop smoking, and reviewed strategies to maximize success      Major depressive disorder, recurrent episode with anxious distress    Per family she is irritable, tearful at times,  And her moods have been more labile  She has endorsed suicidal thoughts to fAMILY ON SEVERAL OCCASIONS,  PER DAUGHTER,  BUT IS ABLE TO [place]. Increasing dose of lamictal for mood swings, and adding trazodone for insomnia .  FOLLOW UP ONE MONTH       Relevant Medications   traZODone (DESYREL) 50 MG tablet   Vitamin D deficiency - Primary   Relevant Orders   Vit D  25 hydroxy (rtn osteoporosis monitoring) (Completed)    Other Visit Diagnoses    Hyperlipidemia associated with type 2 diabetes mellitus        Other fatigue        Relevant Orders    TSH (Completed)       I have changed Ms. Bernhart's lamoTRIgine. I am also having her start on traZODone. Additionally, I am having her maintain her acetaminophen, Disposable Brief Large, ibuprofen, HYDROcodone-acetaminophen, Tdap, ergocalciferol, cyanocobalamin, meloxicam, Tdap, CRESTOR, B-D INS SYR ULTRAFINE .3CC/30G, atorvastatin, metFORMIN, glipiZIDE, losartan-hydrochlorothiazide, predniSONE, sertraline, insulin NPH Human, atenolol, ACCU-CHEK SMARTVIEW, PHARMACIST CHOICE LANCETS, and lisinopril. We will continue to administer cyanocobalamin.  Meds ordered this encounter    Medications  . lamoTRIgine (LAMICTAL) 25 MG tablet    Sig: Take 2 tablets (50 mg total) by mouth daily. AT BEDTIME    Dispense:  60 tablet  Refill:  2  . traZODone (DESYREL) 50 MG tablet    Sig: Take 0.5-1 tablets (25-50 mg total) by mouth at bedtime as needed for sleep.    Dispense:  30 tablet    Refill:  3    Medications Discontinued During This Encounter  Medication Reason  . lamoTRIgine (LAMICTAL) 25 MG tablet Reorder    A total of 40 minutes was spent with patient more than half of which was spent in counseling patient on the above mentioned issues , reviewing and explaining recent labs and imaging studies done, and coordination of care.  Follow-up: Return in about 4 weeks (around 04/14/2015).   Monica Mc, MD

## 2015-03-18 LAB — MICROALBUMIN / CREATININE URINE RATIO
Creatinine, Urine: 51.2 mg/dL
Microalb Creat Ratio: 25.4 mg/g (ref 0.0–30.0)
Microalb, Ur: 1.3 mg/dL (ref <2.0)

## 2015-03-18 LAB — VITAMIN D 25 HYDROXY (VIT D DEFICIENCY, FRACTURES): Vit D, 25-Hydroxy: 18 ng/mL — ABNORMAL LOW (ref 30–100)

## 2015-03-19 ENCOUNTER — Other Ambulatory Visit: Payer: Self-pay | Admitting: Internal Medicine

## 2015-03-19 DIAGNOSIS — E559 Vitamin D deficiency, unspecified: Secondary | ICD-10-CM

## 2015-03-19 MED ORDER — ERGOCALCIFEROL 1.25 MG (50000 UT) PO CAPS: 50000.0000 [IU] | ORAL_CAPSULE | ORAL | Status: DC

## 2015-03-19 NOTE — Assessment & Plan Note (Addendum)
Per family she is irritable, tearful at times,  And her moods have been more labile  She has endorsed suicidal thoughts to fAMILY ON SEVERAL OCCASIONS,  PER DAUGHTER,  BUT IS ABLE TO [place]. Increasing dose of lamictal for mood swings, and adding trazodone for insomnia .  FOLLOW UP ONE MONTH

## 2015-03-19 NOTE — Assessment & Plan Note (Signed)
Smoking cessation instruction/counseling given:  counseled patient on the dangers of tobacco use, advised patient to stop smoking, and reviewed strategies to maximize success 

## 2015-03-19 NOTE — Assessment & Plan Note (Signed)
Well controlled on current regimen. Renal function stable, no changes today.  Lab Results  Component Value Date   CREATININE 1.18* 03/17/2015   Lab Results  Component Value Date   NA 140 03/17/2015   K 4.4 03/17/2015   CL 105 03/17/2015   CO2 24 03/17/2015

## 2015-03-19 NOTE — Assessment & Plan Note (Signed)
Her diabetes remains well-controlled on current medications but she is having hypoglycemic events due to anorexia and sleep schedule.  We are reducing her glipizide to 5 mg twice daily.  She  is up-to-date on eye exams and foot exam is normal today.  Patient is tolerating statin therapy for CAD risk reduction and on ACE/ARB for reduction in proteinuria.    Lab Results  Component Value Date   HGBA1C 6.8* 03/17/2015   Lab Results  Component Value Date   MICROALBUR 1.3 03/17/2015

## 2015-03-19 NOTE — Assessment & Plan Note (Signed)
She is not tolerating atorvastatin due to nausea but is taking it in the am. Advised to change to evening dosing, and will change to generic Crestor if she continues to report nausea.   Lab Results  Component Value Date   CHOL 157 03/17/2015   HDL 45* 03/17/2015   LDLCALC 90 03/17/2015   LDLDIRECT 99 03/17/2015   TRIG 112 03/17/2015   CHOLHDL 3.5 03/17/2015

## 2015-03-19 NOTE — Assessment & Plan Note (Signed)
Secondary to tobacco abuse , ongoing.  Currently asymptomatic.

## 2015-03-20 ENCOUNTER — Telehealth: Payer: Self-pay | Admitting: Internal Medicine

## 2015-03-20 NOTE — Telephone Encounter (Signed)
Pt returned your call from today. Thank You!

## 2015-03-21 NOTE — Telephone Encounter (Signed)
See result note patient notified of results. 

## 2015-04-13 ENCOUNTER — Telehealth: Payer: Self-pay | Admitting: Internal Medicine

## 2015-04-13 NOTE — Telephone Encounter (Signed)
Spoke with patient, changed the appointment for her.

## 2015-04-13 NOTE — Telephone Encounter (Signed)
Pt called requesting to speak to Juliann Pulse about her appt for tomorrow that she can not make. Pt states it's very important that she speaks with you. Thank You!

## 2015-04-14 ENCOUNTER — Ambulatory Visit: Payer: Medicare Other | Admitting: Internal Medicine

## 2015-05-02 ENCOUNTER — Other Ambulatory Visit: Payer: Self-pay | Admitting: Internal Medicine

## 2015-05-05 ENCOUNTER — Encounter: Payer: Self-pay | Admitting: Internal Medicine

## 2015-05-05 ENCOUNTER — Ambulatory Visit (INDEPENDENT_AMBULATORY_CARE_PROVIDER_SITE_OTHER): Payer: Medicare Other | Admitting: Internal Medicine

## 2015-05-05 VITALS — BP 130/80 | HR 75 | Temp 97.7°F | Wt 175.1 lb

## 2015-05-05 DIAGNOSIS — J431 Panlobular emphysema: Secondary | ICD-10-CM

## 2015-05-05 DIAGNOSIS — F339 Major depressive disorder, recurrent, unspecified: Secondary | ICD-10-CM | POA: Diagnosis not present

## 2015-05-05 DIAGNOSIS — J069 Acute upper respiratory infection, unspecified: Secondary | ICD-10-CM

## 2015-05-05 MED ORDER — LAMOTRIGINE 100 MG PO TABS: 100.0000 mg | ORAL_TABLET | Freq: Every day | ORAL | Status: DC

## 2015-05-05 MED ORDER — PREDNISONE 10 MG PO TABS: ORAL_TABLET | ORAL | Status: DC

## 2015-05-05 MED ORDER — BENZONATATE 100 MG PO CAPS: 200.0000 mg | ORAL_CAPSULE | Freq: Three times a day (TID) | ORAL | Status: DC | PRN

## 2015-05-05 NOTE — Patient Instructions (Signed)
I am starting you on a prednisone taper along with a cough medicine to take up to 3 times daily to treat your viral infection   If the cough does not clear up in a week,  Call to arrange chest x ray   I increased your lamictal to 100 mg daily at bedtime (1 tablet )    You can continue the trazodone to help you rest   Return in January for diabetes follow up

## 2015-05-05 NOTE — Progress Notes (Signed)
Pre visit review using our clinic review tool, if applicable. No additional management support is needed unless otherwise documented below in the visit note. 

## 2015-05-07 ENCOUNTER — Encounter: Payer: Self-pay | Admitting: Internal Medicine

## 2015-05-07 DIAGNOSIS — J441 Chronic obstructive pulmonary disease with (acute) exacerbation: Secondary | ICD-10-CM | POA: Insufficient documentation

## 2015-05-07 NOTE — Assessment & Plan Note (Signed)
Mild improvement noted but continues to have some mood lability and irritability that is "over the top".  Increasiuing lamictal to 100 mg daily

## 2015-05-07 NOTE — Progress Notes (Signed)
Subjective:  Patient ID: Monica [name], female    DOB: 11-Feb-1930  Age: 81 y.o. MRN: [id number]  CC: The primary encounter diagnosis was Panlobular emphysema ([place]). Diagnoses of Upper respiratory infection, viral and Major depressive disorder, recurrent episode with anxious distress ([place]) were also pertinent to this visit.  HPI Monica [name] presents for 2 week history of cough productive of clear sputum.  Denies fevers,  Shortness of breath and malaise. Some sinus congestion without sinus or ear pain. No wheezing or chest pain. No body aches. Still smoking.   2) follow up on depression with labile mood swings.  She is tolerating the lamictal and daughter notes mild improvement  In mood but still becomes very angry and irritable on a dailly basis.    Outpatient Prescriptions Prior to Visit  Medication Sig Dispense Refill  . ACCU-CHEK SMARTVIEW test strip USE 2 TIMES DAILY FOR DIABETIC TESTING 200 each 0  . acetaminophen (TYLENOL) 500 MG tablet Take 500 mg by mouth as needed.      Marland Kitchen atenolol (TENORMIN) 25 MG tablet TAKE ONE TABLET TWICE DAILY 60 tablet 5  . atenolol (TENORMIN) 50 MG tablet TAKE ONE (1) TABLET BY MOUTH TWO (2) TIMES DAILY 60 tablet 0  . atorvastatin (LIPITOR) 80 MG tablet Take 1 tablet (80 mg total) by mouth daily. 90 tablet 3  . B-D INS SYR ULTRAFINE .3CC/30G 30G X 1/2" 0.3 ML MISC USE TO INJECT 15 UNITS SUBQ AT BEDTIME 100 each 3  . CRESTOR 20 MG tablet TAKE ONE (1) TABLET BY MOUTH EVERY DAY 30 tablet 6  . cyanocobalamin (,VITAMIN B-12,) 1000 MCG/ML injection Inject 1 mL (1,000 mcg total) into the muscle once. I ml IM injection weekly x 3, then monthly 10 mL 0  . ergocalciferol (DRISDOL) 50000 UNITS capsule Take 1 capsule (50,000 Units total) by mouth once a week. 12 capsule 0  . glipiZIDE (GLUCOTROL) 5 MG tablet TAKE ONE TABLET TWICE DAILY AFTER MEALS 180 tablet 1  . HYDROcodone-acetaminophen (NORCO/VICODIN) 5-325 MG per tablet Take 1 tablet by mouth daily as needed for pain.  30 tablet 3  . ibuprofen (ADVIL,MOTRIN) 200 MG tablet Take 200 mg by mouth every 6 (six) hours as needed for pain.    . Incontinence Supply Disposable (DISPOSABLE BRIEF LARGE) MISC 1 application by Does not apply route 2 (two) times daily. 36 each 12  . insulin NPH Human (HUMULIN N) 100 UNIT/ML injection 20 units a bedtime and 5 units in am unless cbg is < 150 1 vial 12  . lisinopril (PRINIVIL,ZESTRIL) 40 MG tablet TAKE ONE (1) TABLET EACH DAY 90 tablet 1  . losartan-hydrochlorothiazide (HYZAAR) 100-25 MG per tablet TAKE ONE (1) TABLET BY MOUTH EVERY DAY 30 tablet 6  . meloxicam (MOBIC) 15 MG tablet Take 1 tablet (15 mg total) by mouth daily. 90 tablet 1  . metFORMIN (GLUCOPHAGE) 850 MG tablet TAKE ONE TABLET BY MOUTH TWICE DAILY WITH FOOD AS DIRECTED 60 tablet 0  . PHARMACIST CHOICE LANCETS MISC USE 2 TIMES DAILY FOR DIABETIC TESTING 200 each 0  . sertraline (ZOLOFT) 100 MG tablet TAKE ONE TABLET BY MOUTH EVERY NIGHT AT BEDTIME 30 tablet 2  . TDaP (BOOSTRIX) 5-2.5-18.5 LF-MCG/0.5 injection Inject 0.5 mLs into the muscle once. 0.5 mL 0  . Tdap (BOOSTRIX) 5-2.5-18.5 LF-MCG/0.5 injection Inject 0.5 mLs into the muscle once. 0.5 mL 0  . traZODone (DESYREL) 50 MG tablet Take 0.5-1 tablets (25-50 mg total) by mouth at bedtime as needed for sleep.  30 tablet 3  . lamoTRIgine (LAMICTAL) 25 MG tablet Take 2 tablets (50 mg total) by mouth daily. AT BEDTIME 60 tablet 2  . predniSONE (DELTASONE) 10 MG tablet 6 tablets on Day 1 , then reduce by 1 tablet daily until gone.  START ON sATURDAY FEB 20TH 21 tablet 0   Facility-Administered Medications Prior to Visit  Medication Dose Route Frequency Provider Last Rate Last Dose  . cyanocobalamin ((VITAMIN B-12)) injection 1,000 mcg  1,000 mcg Intramuscular Once [name], MD        Review of Systems;  Patient denies headache, fevers, malaise, unintentional weight loss, skin rash, eye pain, sinus congestion and sinus pain, sore throat, dysphagia,  hemoptysis  , cough, dyspnea, wheezing, chest pain, palpitations, orthopnea, edema, abdominal pain, nausea, melena, diarrhea, constipation, flank pain, dysuria, hematuria, urinary  Frequency, nocturia, numbness, tingling, seizures,  Focal weakness, Loss of consciousness,  Tremor, insomnia, depression, anxiety, and suicidal ideation.      Objective:  BP 130/80 mmHg  Pulse 75  Temp(Src) 97.7 F (36.5 C) (Oral)  Wt 175 lb 1.9 oz (79.434 kg)  SpO2 95%  BP Readings from Last 3 Encounters:  05/05/15 130/80  03/17/15 130/72  08/10/14 108/70    Wt Readings from Last 3 Encounters:  05/05/15 175 lb 1.9 oz (79.434 kg)  03/17/15 179 lb 12.8 oz (81.557 kg)  08/10/14 176 lb 12 oz (80.173 kg)    General appearance: alert, cooperative and appears stated age Ears: normal TM's and external ear canals both ears Throat: lips, mucosa, and tongue normal; teeth and gums normal Neck: no adenopathy, no carotid bruit, supple, symmetrical, trachea midline and thyroid not enlarged, symmetric, no tenderness/mass/nodules Back: symmetric, no curvature. ROM normal. No CVA tenderness. Lungs: clear to auscultation bilaterally Heart: regular rate and rhythm, S1, S2 normal, no murmur, click, rub or gallop Abdomen: soft, non-tender; bowel sounds normal; no masses,  no organomegaly Pulses: 2+ and symmetric Skin: Skin color, texture, turgor normal. No rashes or lesions Lymph nodes: Cervical, supraclavicular, and axillary nodes normal.  Lab Results  Component Value Date   HGBA1C 6.8* 03/17/2015   HGBA1C 7.9* 08/10/2014   HGBA1C 7.0* 01/06/2014    Lab Results  Component Value Date   CREATININE 1.18* 03/17/2015   CREATININE 1.48* 08/10/2014   CREATININE 1.2 01/06/2014    Lab Results  Component Value Date   WBC 8.7 08/10/2014   HGB 14.4 08/10/2014   HCT 43.2 08/10/2014   PLT 311.0 08/10/2014   GLUCOSE 121* 03/17/2015   CHOL 157 03/17/2015   TRIG 112 03/17/2015   HDL 45* 03/17/2015   LDLDIRECT 99 03/17/2015    LDLCALC 90 03/17/2015   ALT 9 03/17/2015   AST 14 03/17/2015   NA 140 03/17/2015   K 4.4 03/17/2015   CL 105 03/17/2015   CREATININE 1.18* 03/17/2015   BUN 18 03/17/2015   CO2 24 03/17/2015   TSH 4.355 03/17/2015   HGBA1C 6.8* 03/17/2015   MICROALBUR 1.3 03/17/2015    No results found.  Assessment & Plan:   Problem List Items Addressed This Visit    COPD (chronic obstructive pulmonary disease) ([place]) - Primary    Secondary to tobacco abuse , ongoing.  Currently asymptomatic. Lung exam is normal         Relevant Medications   predniSONE (DELTASONE) 10 MG tablet   benzonatate (TESSALON PERLES) 100 MG capsule   Major depressive disorder, recurrent episode with anxious distress (HCC)    Mild improvement noted but continues  to have some mood lability and irritability that is "over the top".  Increasiuing lamictal to 100 mg daily       Upper respiratory infection, viral    With cough.  Prednisone taper and tessalon perles.  Chest x ray in one week if persistent.         A total of 25 minutes of face to face time was spent with patient more than half of which was spent in counselling about the above mentioned conditions  and coordination of care  I have changed Ms. Bloxham's lamoTRIgine and predniSONE. I am also having her start on benzonatate. Additionally, I am having her maintain her acetaminophen, Disposable Brief Large, ibuprofen, HYDROcodone-acetaminophen, Tdap, cyanocobalamin, meloxicam, Tdap, CRESTOR, B-D INS SYR ULTRAFINE .3CC/30G, atorvastatin, glipiZIDE, losartan-hydrochlorothiazide, sertraline, insulin NPH Human, atenolol, ACCU-CHEK SMARTVIEW, PHARMACIST CHOICE LANCETS, lisinopril, traZODone, ergocalciferol, metFORMIN, and atenolol. We will continue to administer cyanocobalamin.  Meds ordered this encounter  Medications  . lamoTRIgine (LAMICTAL) 100 MG tablet    Sig: Take 1 tablet (100 mg total) by mouth daily. AT BEDTIME    Dispense:  30 tablet    Refill:  2  .  predniSONE (DELTASONE) 10 MG tablet    Sig: 6 tablets on Day 1 , then reduce by 1 tablet daily until gone.  START ON Saturday Nov 12 th    Dispense:  21 tablet    Refill:  0  . benzonatate (TESSALON PERLES) 100 MG capsule    Sig: Take 2 capsules (200 mg total) by mouth 3 (three) times daily as needed for cough.    Dispense:  30 capsule    Refill:  1    Medications Discontinued During This Encounter  Medication Reason  . lamoTRIgine (LAMICTAL) 25 MG tablet Reorder  . predniSONE (DELTASONE) 10 MG tablet Reorder    Follow-up: Return in about 2 months (around 07/05/2015) for follow up diabetes.   [name], MD

## 2015-05-07 NOTE — Assessment & Plan Note (Signed)
Secondary to tobacco abuse , ongoing.  Currently asymptomatic. Lung exam is normal

## 2015-05-07 NOTE — Assessment & Plan Note (Signed)
With cough.  Prednisone taper and tessalon perles.  Chest x ray in one week if persistent.

## 2015-06-12 ENCOUNTER — Other Ambulatory Visit: Payer: Self-pay | Admitting: Internal Medicine

## 2015-06-12 NOTE — Telephone Encounter (Signed)
Zoloft last refilled 08/12/14 for #30 with 2 refills. Ok to refill this medication?

## 2015-06-27 ENCOUNTER — Other Ambulatory Visit: Payer: Self-pay | Admitting: Internal Medicine

## 2015-07-10 ENCOUNTER — Other Ambulatory Visit: Payer: Self-pay | Admitting: Internal Medicine

## 2015-07-26 ENCOUNTER — Other Ambulatory Visit: Payer: Self-pay | Admitting: Internal Medicine

## 2015-09-18 ENCOUNTER — Other Ambulatory Visit: Payer: Self-pay | Admitting: Internal Medicine

## 2015-09-27 ENCOUNTER — Encounter: Payer: Self-pay | Admitting: Internal Medicine

## 2015-09-27 ENCOUNTER — Ambulatory Visit (INDEPENDENT_AMBULATORY_CARE_PROVIDER_SITE_OTHER): Payer: Medicare Other | Admitting: Internal Medicine

## 2015-09-27 VITALS — BP 126/74 | HR 63 | Temp 98.0°F | Resp 12 | Ht 65.0 in | Wt 175.5 lb

## 2015-09-27 DIAGNOSIS — N183 Chronic kidney disease, stage 3 unspecified: Secondary | ICD-10-CM

## 2015-09-27 DIAGNOSIS — E559 Vitamin D deficiency, unspecified: Secondary | ICD-10-CM

## 2015-09-27 DIAGNOSIS — Z716 Tobacco abuse counseling: Secondary | ICD-10-CM

## 2015-09-27 DIAGNOSIS — E119 Type 2 diabetes mellitus without complications: Secondary | ICD-10-CM | POA: Diagnosis not present

## 2015-09-27 DIAGNOSIS — E1122 Type 2 diabetes mellitus with diabetic chronic kidney disease: Secondary | ICD-10-CM

## 2015-09-27 DIAGNOSIS — J301 Allergic rhinitis due to pollen: Secondary | ICD-10-CM

## 2015-09-27 DIAGNOSIS — Z720 Tobacco use: Secondary | ICD-10-CM

## 2015-09-27 DIAGNOSIS — E785 Hyperlipidemia, unspecified: Secondary | ICD-10-CM

## 2015-09-27 DIAGNOSIS — I10 Essential (primary) hypertension: Secondary | ICD-10-CM

## 2015-09-27 DIAGNOSIS — R05 Cough: Secondary | ICD-10-CM | POA: Diagnosis not present

## 2015-09-27 DIAGNOSIS — Z7289 Other problems related to lifestyle: Secondary | ICD-10-CM | POA: Diagnosis not present

## 2015-09-27 DIAGNOSIS — E1322 Other specified diabetes mellitus with diabetic chronic kidney disease: Secondary | ICD-10-CM

## 2015-09-27 DIAGNOSIS — E538 Deficiency of other specified B group vitamins: Secondary | ICD-10-CM | POA: Diagnosis not present

## 2015-09-27 DIAGNOSIS — N3946 Mixed incontinence: Secondary | ICD-10-CM

## 2015-09-27 DIAGNOSIS — I129 Hypertensive chronic kidney disease with stage 1 through stage 4 chronic kidney disease, or unspecified chronic kidney disease: Secondary | ICD-10-CM

## 2015-09-27 DIAGNOSIS — R059 Cough, unspecified: Secondary | ICD-10-CM

## 2015-09-27 DIAGNOSIS — J441 Chronic obstructive pulmonary disease with (acute) exacerbation: Secondary | ICD-10-CM

## 2015-09-27 LAB — LIPID PANEL
CHOLESTEROL: 180 mg/dL (ref 0–200)
HDL: 35.1 mg/dL — AB (ref >39.00)
LDL Cholesterol: 107 mg/dL — ABNORMAL HIGH (ref 0–99)
NonHDL: 144.47
TRIGLYCERIDES: 185 mg/dL — AB (ref 0.0–149.0)
Total CHOL/HDL Ratio: 5
VLDL: 37 mg/dL (ref 0.0–40.0)

## 2015-09-27 LAB — CBC WITH DIFFERENTIAL/PLATELET
BASOS ABS: 0 10*3/uL (ref 0.0–0.1)
Basophils Relative: 0.4 % (ref 0.0–3.0)
EOS ABS: 0 10*3/uL (ref 0.0–0.7)
Eosinophils Relative: 1.3 % (ref 0.0–5.0)
HEMATOCRIT: 44 % (ref 36.0–46.0)
Hemoglobin: 14.6 g/dL (ref 12.0–15.0)
LYMPHS ABS: 1 10*3/uL (ref 0.7–4.0)
LYMPHS PCT: 26.5 % (ref 12.0–46.0)
MCHC: 33.3 g/dL (ref 30.0–36.0)
MCV: 87.3 fl (ref 78.0–100.0)
Monocytes Absolute: 0.6 10*3/uL (ref 0.1–1.0)
Monocytes Relative: 16.9 % — ABNORMAL HIGH (ref 3.0–12.0)
NEUTROS ABS: 2 10*3/uL (ref 1.4–7.7)
NEUTROS PCT: 54.9 % (ref 43.0–77.0)
PLATELETS: 178 10*3/uL (ref 150.0–400.0)
RBC: 5.03 Mil/uL (ref 3.87–5.11)
RDW: 16.2 % — ABNORMAL HIGH (ref 11.5–15.5)
WBC: 3.7 10*3/uL — ABNORMAL LOW (ref 4.0–10.5)

## 2015-09-27 LAB — HEMOGLOBIN A1C: Hgb A1c MFr Bld: 6.8 % — ABNORMAL HIGH (ref 4.6–6.5)

## 2015-09-27 LAB — COMPREHENSIVE METABOLIC PANEL
ALBUMIN: 3.8 g/dL (ref 3.5–5.2)
ALK PHOS: 57 U/L (ref 39–117)
ALT: 12 U/L (ref 0–35)
AST: 19 U/L (ref 0–37)
BILIRUBIN TOTAL: 0.3 mg/dL (ref 0.2–1.2)
BUN: 28 mg/dL — ABNORMAL HIGH (ref 6–23)
CALCIUM: 9.5 mg/dL (ref 8.4–10.5)
CO2: 23 mEq/L (ref 19–32)
Chloride: 110 mEq/L (ref 96–112)
Creatinine, Ser: 1.41 mg/dL — ABNORMAL HIGH (ref 0.40–1.20)
GFR: 37.79 mL/min — AB (ref >60.00)
Glucose, Bld: 106 mg/dL — ABNORMAL HIGH (ref 70–99)
POTASSIUM: 3.6 meq/L (ref 3.5–5.1)
Sodium: 143 mEq/L (ref 135–145)
TOTAL PROTEIN: 6.9 g/dL (ref 6.0–8.3)

## 2015-09-27 LAB — VITAMIN D 25 HYDROXY (VIT D DEFICIENCY, FRACTURES): VITD: 22.58 ng/mL — ABNORMAL LOW (ref 30.00–100.00)

## 2015-09-27 LAB — MICROALBUMIN / CREATININE URINE RATIO
CREATININE, U: 152 mg/dL
MICROALB/CREAT RATIO: 4.5 mg/g (ref 0.0–30.0)
Microalb, Ur: 6.9 mg/dL — ABNORMAL HIGH (ref 0.0–1.9)

## 2015-09-27 LAB — POCT INFLUENZA A/B
INFLUENZA B, POC: NEGATIVE
Influenza A, POC: NEGATIVE

## 2015-09-27 LAB — TSH: TSH: 6.66 u[IU]/mL — AB (ref 0.35–4.50)

## 2015-09-27 LAB — HEPATITIS C ANTIBODY: HCV Ab: NEGATIVE

## 2015-09-27 MED ORDER — DOXYCYCLINE HYCLATE 100 MG PO CAPS: 100.0000 mg | ORAL_CAPSULE | Freq: Two times a day (BID) | ORAL | Status: DC

## 2015-09-27 MED ORDER — BENZONATATE 100 MG PO CAPS: 200.0000 mg | ORAL_CAPSULE | Freq: Three times a day (TID) | ORAL | Status: DC | PRN

## 2015-09-27 MED ORDER — PREDNISONE 10 MG PO TABS: ORAL_TABLET | ORAL | Status: DC

## 2015-09-27 NOTE — Assessment & Plan Note (Addendum)
Mild, without hypoxia.  Rapid flu test negative.  Rx.prednisone taper,  Doxycycline.  Prn albuterol MDI

## 2015-09-27 NOTE — Progress Notes (Signed)
Subjective:  Patient ID: Monica [name], female    DOB: 1930/02/28  Age: 82 y.o. MRN: [id number]  CC: The primary encounter diagnosis was Cough. Diagnoses of COPD exacerbation ([place]), Diabetes mellitus without complication ([place]), 123456 deficiency, Vitamin D deficiency, Other problems related to lifestyle, Hyperlipidemia, Secondary DM with CKD stage 3 and hypertension ([place]), Type 2 DM with CKD stage 3 and hypertension ([place]), Mixed incontinence, Tobacco abuse disorder, Tobacco abuse counseling, Essential hypertension, and Allergic rhinitis due to pollen were also pertinent to this visit.  HPI  Monica [name] presents for follow up on diabetes and new onset illness with cough and low grade fevers.  Symptoms have been present for the  past 4-5 days.  Temp yesterday was 100.8  .  Has been having muscle aches,  Sneezing and sweating.   No nausea , no persistent headache but had some sinus pressure and drainage that has bees clear. Still smoking 2-3 cigs daily.  Not aware of her COPD diagnosis because she no symptoms of cough or dyspnea  At baseline .   CBGs are 98 to 111 fasting,  Post prandial high was  156   Fastin  today   Lab Results  Component Value Date   HGBA1C 6.8* 09/27/2015     Outpatient Prescriptions Prior to Visit  Medication Sig Dispense Refill  . atenolol (TENORMIN) 50 MG tablet TAKE ONE (1) TABLET BY MOUTH TWO (2) TIMES DAILY 180 tablet 1  . atorvastatin (LIPITOR) 80 MG tablet Take 1 tablet (80 mg total) by mouth daily. 90 tablet 3  . B-D INS SYR ULTRAFINE .3CC/30G 30G X 1/2" 0.3 ML MISC INJECT 15 UNITS SUBQ AT BEDTIME AS DIRECTED 100 each 2  . cyanocobalamin (,VITAMIN B-12,) 1000 MCG/ML injection Inject 1 mL (1,000 mcg total) into the muscle once. I ml IM injection weekly x 3, then monthly 10 mL 0  . ergocalciferol (DRISDOL) 50000 UNITS capsule Take 1 capsule (50,000 Units total) by mouth once a week. 12 capsule 0  . glipiZIDE (GLUCOTROL) 5 MG tablet TAKE ONE TABLET TWICE DAILY AFTER  MEALS 180 tablet 1  . HUMULIN N 100 UNIT/ML injection INJECT 20 UNITS AT BEDTIME AND 5 UNITS IN MORNING UNLESS BG<150 10 mL 4  . ibuprofen (ADVIL,MOTRIN) 200 MG tablet Take 200 mg by mouth every 6 (six) hours as needed for pain.    . Incontinence Supply Disposable (DISPOSABLE BRIEF LARGE) MISC 1 application by Does not apply route 2 (two) times daily. 36 each 12  . lamoTRIgine (LAMICTAL) 100 MG tablet TAKE ONE TABLET BY MOUTH EVERY NIGHT AT BEDTIME 90 tablet 1  . lisinopril (PRINIVIL,ZESTRIL) 40 MG tablet TAKE ONE TABLET BY MOUTH EVERY DAY 90 tablet 1  . losartan-hydrochlorothiazide (HYZAAR) 100-25 MG per tablet TAKE ONE (1) TABLET BY MOUTH EVERY DAY 30 tablet 6  . metFORMIN (GLUCOPHAGE) 850 MG tablet TAKE ONE TABLET TWICE DAILY WITH FOOD ASDIRECTED 180 tablet 1  . PHARMACIST CHOICE LANCETS MISC USE 2 TIMES DAILY FOR DIABETIC TESTING 200 each 0  . sertraline (ZOLOFT) 100 MG tablet TAKE ONE TABLET BY MOUTH EVERY NIGHT AT BEDTIME 90 tablet 1  . traZODone (DESYREL) 50 MG tablet Take 0.5-1 tablets (25-50 mg total) by mouth at bedtime as needed for sleep. 30 tablet 3  . ACCU-CHEK SMARTVIEW test strip USE 2 TIMES DAILY FOR DIABETIC TESTING 200 each 0  . acetaminophen (TYLENOL) 500 MG tablet Take 500 mg by mouth as needed.      Marland Kitchen atenolol (TENORMIN) 25  MG tablet TAKE ONE TABLET TWICE DAILY (Patient not taking: Reported on 09/27/2015) 60 tablet 5  . CRESTOR 20 MG tablet TAKE ONE (1) TABLET BY MOUTH EVERY DAY (Patient not taking: Reported on 09/27/2015) 30 tablet 6  . HYDROcodone-acetaminophen (NORCO/VICODIN) 5-325 MG per tablet Take 1 tablet by mouth daily as needed for pain. (Patient not taking: Reported on 09/27/2015) 30 tablet 3  . meloxicam (MOBIC) 15 MG tablet Take 1 tablet (15 mg total) by mouth daily. (Patient not taking: Reported on 09/27/2015) 90 tablet 1  . TDaP (BOOSTRIX) 5-2.5-18.5 LF-MCG/0.5 injection Inject 0.5 mLs into the muscle once. (Patient not taking: Reported on 09/27/2015) 0.5 mL 0  . Tdap  (BOOSTRIX) 5-2.5-18.5 LF-MCG/0.5 injection Inject 0.5 mLs into the muscle once. (Patient not taking: Reported on 09/27/2015) 0.5 mL 0  . benzonatate (TESSALON PERLES) 100 MG capsule Take 2 capsules (200 mg total) by mouth 3 (three) times daily as needed for cough. (Patient not taking: Reported on 09/27/2015) 30 capsule 1  . predniSONE (DELTASONE) 10 MG tablet 6 tablets on Day 1 , then reduce by 1 tablet daily until gone.  START ON Saturday Nov 12 th (Patient not taking: Reported on 09/27/2015) 21 tablet 0   Facility-Administered Medications Prior to Visit  Medication Dose Route Frequency Provider Last Rate Last Dose  . cyanocobalamin ((VITAMIN B-12)) injection 1,000 mcg  1,000 mcg Intramuscular Once [name], MD        Review of Systems;  Patient deniesunintentional weight loss, skin rash, eye pain, , sore throat, dysphagia,  hemoptysis , cough,  chest pain, palpitations, orthopnea, edema, abdominal pain, nausea, melena, diarrhea, constipation, flank pain, dysuria, hematuria, urinary  Frequency, nocturia, numbness, tingling, seizures,  Focal weakness, Loss of consciousness,  Tremor, insomnia, depression, anxiety, and suicidal ideation.      Objective:  BP 126/74 mmHg  Pulse 63  Temp(Src) 98 F (36.7 C) (Oral)  Resp 12  Ht 5\' 5"  (1.651 m)  Wt 175 lb 8 oz (79.606 kg)  BMI 29.20 kg/m2  SpO2 95%  BP Readings from Last 3 Encounters:  09/27/15 126/74  05/05/15 130/80  03/17/15 130/72    Wt Readings from Last 3 Encounters:  09/27/15 175 lb 8 oz (79.606 kg)  05/05/15 175 lb 1.9 oz (79.434 kg)  03/17/15 179 lb 12.8 oz (81.557 kg)    General appearance: alert, cooperative and appears stated age Ears: normal TM's and external ear canals both ears Throat: lips, mucosa, and tongue normal; teeth and gums normal Neck: cervical lymphadenopathy, no carotid bruit, supple, symmetrical, trachea midline and thyroid not enlarged, symmetric, no tenderness/mass/nodules Back: symmetric, no  curvature. ROM normal. No CVA tenderness. Lungs: mild wheezing bilaterally,  Good air movement.  Heart: regular rate and rhythm, S1, S2 normal, no murmur, click, rub or gallop Abdomen: soft, non-tender; bowel sounds normal; no masses,  no organomegaly Pulses: 2+ and symmetric Skin: Skin color, texture, turgor normal. No rashes or lesions Lymph nodes: Cervical, supraclavicular, and axillary nodes normal. Foot exam:  Nails are well trimmed,  No callouses,  Sensation intact to microfilament   Assessment & Plan:   Problem List Items Addressed This Visit    Diabetes mellitus with stage 3 chronic kidney disease, without long-term current use of insulin (HCC)    Her diabetes remains well-controlled on current regimen of glipizide to 5 mg twice daily.  She  is up-to-date on eye exams and foot exam is normal today.  Patient is tolerating statin therapy for CAD risk reduction and  on ACE/ARB for reduction in proteinuria.    Lab Results  Component Value Date   HGBA1C 6.8* 09/27/2015   Lab Results  Component Value Date   MICROALBUR 6.9* 09/27/2015               Hypertension    Well controlled on current regimen. Renal function  stable, no changes today.  Lab Results  Component Value Date   CREATININE 1.41* 09/27/2015   CREATININE 1.18* 03/17/2015   CREATININE 1.48* 08/10/2014        Tobacco abuse disorder    She has not smoled in 4 days since symptoms began. Smoking cessation instruction/counseling given:  counseled patient on the dangers of tobacco use, advised patient to stop smoking, and reviewed strategies to maximize success        Incontinence of urine    She continues to require adult diapers for mixed stress/urge incontinence.      Tobacco abuse counseling    Spent 3 minutes discussing risk of continued tobacco abuse, including but not limited to CAD, PAD, hypertension, and CA.  Smoking cessation instruction/counseling given: commended patient for suspending daily  use. And encouraged  Patient to not resume after illness resolves.       COPD exacerbation (HCC)    Mild, without hypoxia.  Rapid flu test negative.  Rx.prednisone taper,  Doxycycline.  Prn albuterol MDI       Relevant Medications   benzonatate (TESSALON PERLES) 100 MG capsule   predniSONE (DELTASONE) 10 MG tablet   Other Relevant Orders   CBC with Differential/Platelet (Completed)   Allergic rhinitis    There are several previously prescribed medications for seasonal allergies that are now available otc. She can try  allegra (fexofenadine 180 mg daily), zyrtec (cetirizine 10 mg daily) and claritin (loratadine 10 mg daily) .  If those fail, the next step is a steroid nasal spray which I can prescribe.       Hyperlipidemia   Relevant Orders   TSH (Completed)   Vitamin D deficiency   Relevant Medications   ergocalciferol (DRISDOL) 50000 units capsule   Other Relevant Orders   VITAMIN D 25 Hydroxy (Vit-D Deficiency, Fractures) (Completed)   B12 deficiency    Other Visit Diagnoses    Cough    -  Primary    Relevant Orders    POCT Influenza A/B (Completed)    Other problems related to lifestyle        Relevant Orders    Hepatitis C antibody (Completed)    HIV antibody (Completed)    Secondary DM with CKD stage 3 and hypertension ([place])        Type 2 DM with CKD stage 3 and hypertension ([place])        Relevant Orders    Ambulatory referral to Nephrology      A total of 25 minutes of face to face time was spent with patient more than half of which was spent in counselling about the above mentioned conditions  and coordination of care   I have changed Ms. Knab's predniSONE. I am also having her start on doxycycline and ergocalciferol. Additionally, I am having her maintain her acetaminophen, Disposable Brief Large, ibuprofen, HYDROcodone-acetaminophen, Tdap, cyanocobalamin, meloxicam, Tdap, CRESTOR, atorvastatin, losartan-hydrochlorothiazide, atenolol, ACCU-CHEK SMARTVIEW, PHARMACIST  CHOICE LANCETS, traZODone, ergocalciferol, B-D INS SYR ULTRAFINE .3CC/30G, metFORMIN, atenolol, lamoTRIgine, sertraline, glipiZIDE, lisinopril, HUMULIN N, and benzonatate. We will continue to administer cyanocobalamin.  Meds ordered this encounter  Medications  . benzonatate (TESSALON PERLES) 100  MG capsule    Sig: Take 2 capsules (200 mg total) by mouth 3 (three) times daily as needed for cough.    Dispense:  30 capsule    Refill:  1  . predniSONE (DELTASONE) 10 MG tablet    Sig: 6 tablets on Day 1 , then reduce by 1 tablet daily until gone.    Dispense:  21 tablet    Refill:  0  . doxycycline (VIBRAMYCIN) 100 MG capsule    Sig: Take 1 capsule (100 mg total) by mouth 2 (two) times daily. WITH FOOD    Dispense:  14 capsule    Refill:  0  . ergocalciferol (DRISDOL) 50000 units capsule    Sig: Take 1 capsule (50,000 Units total) by mouth once a week.    Dispense:  12 capsule    Refill:  0    Medications Discontinued During This Encounter  Medication Reason  . benzonatate (TESSALON PERLES) 100 MG capsule Reorder  . predniSONE (DELTASONE) 10 MG tablet Reorder    Follow-up: Return in about 3 months (around 12/27/2015) for follow up diabetes.   [name], MD

## 2015-09-27 NOTE — Patient Instructions (Signed)
your symptoms today are due to a COPD exacerbation.  Please stop smoking  I am prescribing you a prednisone taper, a 7 day course of doxycycline and an cough suppressan to use for the next week.  You can use the albuterol inhaler if needed for wheezing/chest tightness (use every 6 hours if needed).     You should use either sudafed PE/afrin for nasal congestion or Afrin nasal spray twice daily for 5 days for the ear and sinus congestion  .For your allergies ,  You can use Benadryl but you should also consider adding one of these newer second generation antihistamines that are longer acting, non sedating and  available OTC:  Generic  Zyrtec, which is cetirizine.    generic Allegra , available generically as fexofenadine ; comes in 60 mg and 180 mg once daily strengths.    Generic Claritin :  also available as loratidine .

## 2015-09-27 NOTE — Progress Notes (Signed)
Pre-visit discussion using our clinic review tool. No additional management support is needed unless otherwise documented below in the visit note.  

## 2015-09-28 LAB — HIV ANTIBODY (ROUTINE TESTING W REFLEX): HIV 1&2 Ab, 4th Generation: NONREACTIVE

## 2015-09-28 MED ORDER — ERGOCALCIFEROL 1.25 MG (50000 UT) PO CAPS: 50000.0000 [IU] | ORAL_CAPSULE | ORAL | Status: DC

## 2015-09-30 DIAGNOSIS — J309 Allergic rhinitis, unspecified: Secondary | ICD-10-CM | POA: Insufficient documentation

## 2015-09-30 NOTE — Assessment & Plan Note (Addendum)
She has not smoled in 4 days since symptoms began. Smoking cessation instruction/counseling given:  counseled patient on the dangers of tobacco use, advised patient to stop smoking, and reviewed strategies to maximize success

## 2015-09-30 NOTE — Assessment & Plan Note (Signed)
Spent 3 minutes discussing risk of continued tobacco abuse, including but not limited to CAD, PAD, hypertension, and CA.  Smoking cessation instruction/counseling given: commended patient for suspending daily use. And encouraged  Patient to not resume after illness resolves.

## 2015-09-30 NOTE — Assessment & Plan Note (Signed)
Her diabetes remains well-controlled on current regimen of glipizide to 5 mg twice daily.  She  is up-to-date on eye exams and foot exam is normal today.  Patient is tolerating statin therapy for CAD risk reduction and on ACE/ARB for reduction in proteinuria.    Lab Results  Component Value Date   HGBA1C 6.8* 09/27/2015   Lab Results  Component Value Date   MICROALBUR 6.9* 09/27/2015

## 2015-09-30 NOTE — Assessment & Plan Note (Signed)
Well controlled on current regimen. Renal function  stable, no changes today.  Lab Results  Component Value Date   CREATININE 1.41* 09/27/2015   CREATININE 1.18* 03/17/2015   CREATININE 1.48* 08/10/2014

## 2015-09-30 NOTE — Assessment & Plan Note (Signed)
She continues to require adult diapers for mixed stress/urge incontinence. 

## 2015-09-30 NOTE — Assessment & Plan Note (Signed)
There are several previously prescribed medications for seasonal allergies that are now available otc. She can try  allegra (fexofenadine 180 mg daily), zyrtec (cetirizine 10 mg daily) and claritin (loratadine 10 mg daily) .  If those fail, the next step is a steroid nasal spray which I can prescribe.

## 2015-10-11 ENCOUNTER — Telehealth: Payer: Self-pay | Admitting: *Deleted

## 2015-10-11 NOTE — Telephone Encounter (Signed)
Attempted to call patient, left a VM to return my call. Thanks 

## 2015-10-11 NOTE — Telephone Encounter (Signed)
Patient requested a call in reference to her kidney function. She has concerns of her urinating to often, she stated that she has no pain.  Pt contact [phone]

## 2015-10-20 ENCOUNTER — Encounter: Payer: Self-pay | Admitting: Internal Medicine

## 2015-11-13 DIAGNOSIS — R609 Edema, unspecified: Secondary | ICD-10-CM | POA: Diagnosis not present

## 2015-11-13 DIAGNOSIS — N183 Chronic kidney disease, stage 3 (moderate): Secondary | ICD-10-CM | POA: Diagnosis not present

## 2015-11-13 DIAGNOSIS — E1122 Type 2 diabetes mellitus with diabetic chronic kidney disease: Secondary | ICD-10-CM | POA: Diagnosis not present

## 2015-11-13 DIAGNOSIS — I10 Essential (primary) hypertension: Secondary | ICD-10-CM | POA: Diagnosis not present

## 2015-12-21 ENCOUNTER — Other Ambulatory Visit: Payer: Self-pay | Admitting: Internal Medicine

## 2015-12-22 NOTE — Telephone Encounter (Signed)
Pt is requesting a refill. Last filled on 07/26/2015 #90 +1, last OV 09/26/2045. Ok to refill?

## 2015-12-23 NOTE — Telephone Encounter (Signed)
Ok to refill,  Refill sent  

## 2016-01-01 DIAGNOSIS — I10 Essential (primary) hypertension: Secondary | ICD-10-CM | POA: Diagnosis not present

## 2016-01-03 ENCOUNTER — Encounter: Payer: Medicare Other | Admitting: Internal Medicine

## 2016-01-03 DIAGNOSIS — Z0289 Encounter for other administrative examinations: Secondary | ICD-10-CM

## 2016-01-09 ENCOUNTER — Telehealth: Payer: Self-pay | Admitting: Internal Medicine

## 2016-01-09 DIAGNOSIS — I10 Essential (primary) hypertension: Secondary | ICD-10-CM

## 2016-01-09 NOTE — Telephone Encounter (Signed)
Left message for patient to call office.  

## 2016-01-09 NOTE — Telephone Encounter (Signed)
There was no evidence of renal artery stenosis (blockages to the renal arteries)  as a cause of her high blood pressure by the recent renal artery duplex exam

## 2016-01-10 NOTE — Telephone Encounter (Signed)
Patient notified and voiced understanding of results explained that renal means kidney patient voiced understanding.

## 2016-01-16 ENCOUNTER — Encounter: Payer: Self-pay | Admitting: Internal Medicine

## 2016-03-28 ENCOUNTER — Other Ambulatory Visit: Payer: Self-pay | Admitting: Internal Medicine

## 2016-04-12 ENCOUNTER — Other Ambulatory Visit: Payer: Self-pay | Admitting: Internal Medicine

## 2016-06-03 ENCOUNTER — Other Ambulatory Visit: Payer: Self-pay | Admitting: Internal Medicine

## 2016-07-17 ENCOUNTER — Other Ambulatory Visit: Payer: Self-pay | Admitting: *Deleted

## 2016-07-17 DIAGNOSIS — I129 Hypertensive chronic kidney disease with stage 1 through stage 4 chronic kidney disease, or unspecified chronic kidney disease: Secondary | ICD-10-CM

## 2016-07-17 DIAGNOSIS — E1122 Type 2 diabetes mellitus with diabetic chronic kidney disease: Secondary | ICD-10-CM

## 2016-07-17 DIAGNOSIS — N183 Chronic kidney disease, stage 3 unspecified: Secondary | ICD-10-CM

## 2016-07-17 NOTE — Telephone Encounter (Signed)
Pt requested  Medication refill for lisinopril  Pharmacy [place]

## 2016-07-19 ENCOUNTER — Other Ambulatory Visit: Payer: Self-pay | Admitting: Internal Medicine

## 2016-07-19 NOTE — Telephone Encounter (Signed)
Pt last refill on 03/28/16. Pt next appt is 08/21/16. PT last OV is 09/27/15. Ok to refill?

## 2016-07-19 NOTE — Telephone Encounter (Signed)
DENIED, SEE PRIOR MESSAGE

## 2016-07-19 NOTE — Telephone Encounter (Signed)
Tried contacting patient cell phone wrong number.  Contacted pharmacy and advised for patient to contact our office.

## 2016-07-19 NOTE — Telephone Encounter (Signed)
Last office visit 09/27/15 No show 01/03/16  Next office visit 08/21/16 Please advise

## 2016-07-19 NOTE — Telephone Encounter (Signed)
No,  Because she has not followed up with me or with nephrology and her kidney function has to be checked. I will not refill until she has labs. Which I have ordered/

## 2016-07-22 ENCOUNTER — Other Ambulatory Visit: Payer: Self-pay | Admitting: Internal Medicine

## 2016-07-24 NOTE — Telephone Encounter (Signed)
Per chart 90 days was called  In .

## 2016-08-21 ENCOUNTER — Ambulatory Visit: Payer: Medicare Other | Admitting: Internal Medicine

## 2016-08-21 ENCOUNTER — Telehealth: Payer: Self-pay | Admitting: *Deleted

## 2016-08-21 DIAGNOSIS — Z0289 Encounter for other administrative examinations: Secondary | ICD-10-CM

## 2016-08-21 NOTE — Telephone Encounter (Signed)
Pt cancelled 11:00 am OV w/you today. Please advise on below.

## 2016-08-21 NOTE — Telephone Encounter (Signed)
Pt requested a call, she will need to reschedule her appt from today to another day, please give a time and date to reschedule before April  Pt contact [phone]

## 2016-08-21 NOTE — Telephone Encounter (Signed)
.   You can give her a 4:30 appt in mid march AS LONG AS SHE GETS TO THE APPT EARLY TO HAVE LABS DRAWN.  SHE DOES NOT NEED TO FAST

## 2016-08-23 NOTE — Telephone Encounter (Signed)
Left message for patient to return call to office. 

## 2016-09-02 NOTE — Telephone Encounter (Signed)
Left message for patient to call office.  

## 2016-09-05 NOTE — Telephone Encounter (Signed)
Rescheduled patient, thanks

## 2016-09-13 ENCOUNTER — Telehealth: Payer: Self-pay | Admitting: Internal Medicine

## 2016-09-13 DIAGNOSIS — N3941 Urge incontinence: Secondary | ICD-10-CM

## 2016-09-13 NOTE — Telephone Encounter (Signed)
LMTCB. Need to schedule pt a diabetes follow up appt before we can fax the paperwork for urinary incontinence supplies.

## 2016-09-13 NOTE — Telephone Encounter (Signed)
fomsm for urinary incontinence supplies received and completed but will not send until patient schedules an appointment because she is Overdue for  diabetes follow up by 5 months,  Last visit April 2017.  Should be seen every 6 months,  Labs needed prior to visit

## 2016-09-16 ENCOUNTER — Ambulatory Visit: Payer: Medicare Other | Admitting: Internal Medicine

## 2016-09-16 NOTE — Telephone Encounter (Signed)
LMTCB

## 2016-09-24 ENCOUNTER — Telehealth: Payer: Self-pay | Admitting: Internal Medicine

## 2016-09-24 ENCOUNTER — Other Ambulatory Visit: Payer: Self-pay | Admitting: Internal Medicine

## 2016-09-24 NOTE — Telephone Encounter (Signed)
zoloft refill denied,  She has not been seen in over a year and has [facility] last appt

## 2016-09-24 NOTE — Telephone Encounter (Signed)
Refilled: 07/26/2015 Last OV: 09/27/2015 Next OV: 11/12/2016

## 2016-09-26 NOTE — Telephone Encounter (Signed)
LMTCB

## 2016-10-04 ENCOUNTER — Telehealth: Payer: Self-pay | Admitting: Internal Medicine

## 2016-10-04 NOTE — Telephone Encounter (Signed)
Attempted to call pt on all contact numbers. All are invalid or wrong number. Pt due for AWV

## 2016-10-10 NOTE — Telephone Encounter (Signed)
[facility] called and was looking for an update on paperwork that she faxed over in regards to supplies. Please advise, thank you!   Call @ [phone]

## 2016-10-11 ENCOUNTER — Telehealth: Payer: Self-pay | Admitting: Internal Medicine

## 2016-10-11 NOTE — Telephone Encounter (Signed)
Pt called requesting refills on her glipiZIDE (GLUCOTROL) 5 MG tablet, and atenolol (TENORMIN) 50 MG tablet. Pt only has enough medication for tonight. Please advise, thank you!  [name], Monica  Call pt @ [phone]

## 2016-10-16 MED ORDER — METFORMIN HCL 850 MG PO TABS: ORAL_TABLET | ORAL | 0 refills | Status: DC

## 2016-10-16 MED ORDER — ATENOLOL 50 MG PO TABS: ORAL_TABLET | ORAL | 0 refills | Status: DC

## 2016-10-16 MED ORDER — GLIPIZIDE 5 MG PO TABS: ORAL_TABLET | ORAL | 0 refills | Status: DC

## 2016-10-16 NOTE — Telephone Encounter (Signed)
Spoke with pt and informed her that we could refill her medication for 30 days only since she has an appt scheduled for 11/12/2016. I stressed to the pt the importance of coming to her appt or we would not be able to give her any more refills. The pt stated that she would be here. She also got a little upset and that "if I have any problems out of this office I will find a new doctor and I will not listen to Dr. Derrel Nip fussing at me because I have had a lot going on". The pt gave a verbal understanding and stated again that she would be here for her appt in May.

## 2016-10-16 NOTE — Telephone Encounter (Signed)
Spoke with pt and informed her that this medication was denied because it has been over a year since she was seen last. Pt was gave a verbal understanding.

## 2016-10-16 NOTE — Telephone Encounter (Signed)
Spoke with Threasa Beards at [employer] to let her know that we did receive the paperwork but that Dr. Derrel Nip requires an office visit before we fax the paperwork back. We have tried to contact the pt numerous times and have left messages but the pt has not called [place] back. The pt also no showed for her last appt.

## 2016-10-16 NOTE — Telephone Encounter (Signed)
Pt called back and stated that the pharmacy said that they don't not have these medications for her. Pt also needs metFORMIN (GLUCOPHAGE) 850 MG tablet. Please advise, thank you!  Ph[facility]  Call pt @ [phone]

## 2016-11-12 ENCOUNTER — Ambulatory Visit (INDEPENDENT_AMBULATORY_CARE_PROVIDER_SITE_OTHER): Payer: Medicare Other | Admitting: Internal Medicine

## 2016-11-12 DIAGNOSIS — F339 Major depressive disorder, recurrent, unspecified: Secondary | ICD-10-CM | POA: Diagnosis not present

## 2016-11-12 DIAGNOSIS — Z720 Tobacco use: Secondary | ICD-10-CM

## 2016-11-12 DIAGNOSIS — N183 Chronic kidney disease, stage 3 (moderate): Secondary | ICD-10-CM

## 2016-11-12 DIAGNOSIS — I10 Essential (primary) hypertension: Secondary | ICD-10-CM

## 2016-11-12 DIAGNOSIS — E7800 Pure hypercholesterolemia, unspecified: Secondary | ICD-10-CM | POA: Diagnosis not present

## 2016-11-12 DIAGNOSIS — L603 Nail dystrophy: Secondary | ICD-10-CM

## 2016-11-12 DIAGNOSIS — IMO0002 Reserved for concepts with insufficient information to code with codable children: Secondary | ICD-10-CM

## 2016-11-12 DIAGNOSIS — E1121 Type 2 diabetes mellitus with diabetic nephropathy: Secondary | ICD-10-CM

## 2016-11-12 DIAGNOSIS — Z794 Long term (current) use of insulin: Secondary | ICD-10-CM | POA: Diagnosis not present

## 2016-11-12 DIAGNOSIS — E1165 Type 2 diabetes mellitus with hyperglycemia: Secondary | ICD-10-CM | POA: Diagnosis not present

## 2016-11-12 DIAGNOSIS — E559 Vitamin D deficiency, unspecified: Secondary | ICD-10-CM

## 2016-11-12 DIAGNOSIS — N3941 Urge incontinence: Secondary | ICD-10-CM

## 2016-11-12 DIAGNOSIS — E538 Deficiency of other specified B group vitamins: Secondary | ICD-10-CM | POA: Diagnosis not present

## 2016-11-12 DIAGNOSIS — E1122 Type 2 diabetes mellitus with diabetic chronic kidney disease: Secondary | ICD-10-CM

## 2016-11-12 DIAGNOSIS — E1129 Type 2 diabetes mellitus with other diabetic kidney complication: Secondary | ICD-10-CM | POA: Insufficient documentation

## 2016-11-12 LAB — CBC WITH DIFFERENTIAL/PLATELET
BASOS PCT: 1.1 % (ref 0.0–3.0)
Basophils Absolute: 0.1 10*3/uL (ref 0.0–0.1)
EOS ABS: 0.3 10*3/uL (ref 0.0–0.7)
EOS PCT: 2.3 % (ref 0.0–5.0)
HCT: 47.5 % — ABNORMAL HIGH (ref 36.0–46.0)
HEMOGLOBIN: 15.4 g/dL — AB (ref 12.0–15.0)
LYMPHS ABS: 1.8 10*3/uL (ref 0.7–4.0)
Lymphocytes Relative: 15.8 % (ref 12.0–46.0)
MCHC: 32.5 g/dL (ref 30.0–36.0)
MCV: 87.9 fl (ref 78.0–100.0)
MONO ABS: 1 10*3/uL (ref 0.1–1.0)
Monocytes Relative: 8.6 % (ref 3.0–12.0)
NEUTROS ABS: 8.2 10*3/uL — AB (ref 1.4–7.7)
Neutrophils Relative %: 72.2 % (ref 43.0–77.0)
PLATELETS: 255 10*3/uL (ref 150.0–400.0)
RBC: 5.41 Mil/uL — ABNORMAL HIGH (ref 3.87–5.11)
RDW: 15.9 % — AB (ref 11.5–15.5)
WBC: 11.3 10*3/uL — ABNORMAL HIGH (ref 4.0–10.5)

## 2016-11-12 LAB — LDL CHOLESTEROL, DIRECT: LDL DIRECT: 150 mg/dL

## 2016-11-12 LAB — COMPREHENSIVE METABOLIC PANEL
ALBUMIN: 4.4 g/dL (ref 3.5–5.2)
ALK PHOS: 66 U/L (ref 39–117)
ALT: 11 U/L (ref 0–35)
AST: 13 U/L (ref 0–37)
BILIRUBIN TOTAL: 0.4 mg/dL (ref 0.2–1.2)
BUN: 25 mg/dL — ABNORMAL HIGH (ref 6–23)
CALCIUM: 10.3 mg/dL (ref 8.4–10.5)
CO2: 27 meq/L (ref 19–32)
CREATININE: 1.16 mg/dL (ref 0.40–1.20)
Chloride: 106 mEq/L (ref 96–112)
GFR: 47.21 mL/min — AB (ref >60.00)
Glucose, Bld: 166 mg/dL — ABNORMAL HIGH (ref 70–99)
Potassium: 4.6 mEq/L (ref 3.5–5.1)
Sodium: 142 mEq/L (ref 135–145)
Total Protein: 7.2 g/dL (ref 6.0–8.3)

## 2016-11-12 LAB — LIPID PANEL
CHOLESTEROL: 247 mg/dL — AB (ref 0–200)
HDL: 44.8 mg/dL (ref >39.00)
NonHDL: 202.62
Total CHOL/HDL Ratio: 6
Triglycerides: 249 mg/dL — ABNORMAL HIGH (ref 0.0–149.0)
VLDL: 49.8 mg/dL — AB (ref 0.0–40.0)

## 2016-11-12 LAB — MICROALBUMIN / CREATININE URINE RATIO
CREATININE, U: 57.4 mg/dL
MICROALB UR: 7.4 mg/dL — AB (ref 0.0–1.9)
Microalb Creat Ratio: 12.9 mg/g (ref 0.0–30.0)

## 2016-11-12 LAB — HEMOGLOBIN A1C: Hgb A1c MFr Bld: 7.5 % — ABNORMAL HIGH (ref 4.6–6.5)

## 2016-11-12 LAB — VITAMIN D 25 HYDROXY (VIT D DEFICIENCY, FRACTURES): VITD: 14.9 ng/mL — AB (ref 30.00–100.00)

## 2016-11-12 LAB — TSH: TSH: 4.51 u[IU]/mL — ABNORMAL HIGH (ref 0.35–4.50)

## 2016-11-12 LAB — VITAMIN B12: VITAMIN B 12: 186 pg/mL — AB (ref 211–911)

## 2016-11-12 MED ORDER — SERTRALINE HCL 50 MG PO TABS: 50.0000 mg | ORAL_TABLET | Freq: Every day | ORAL | 1 refills | Status: DC

## 2016-11-12 MED ORDER — TRAZODONE HCL 50 MG PO TABS: 25.0000 mg | ORAL_TABLET | Freq: Every evening | ORAL | 3 refills | Status: DC | PRN

## 2016-11-12 NOTE — Patient Instructions (Signed)
Resume the Zoloft (sertraline) starting with 1/2 tablet daily for the first week,  Increase to a full tablet if tolerated.  After two weeks at the full tablet dose,  Increase dose to 2 tablets daily  Resume the trazodone.  Referral to a podiatrist to hlep trim your toenails, is in process    Resume losartan/hct for blood pressure,  Continue atenolol and lisinopril as well.    I will see you again in 6 months

## 2016-11-12 NOTE — Progress Notes (Signed)
Subjective:  Patient ID: Monica [name], female    DOB: 1929/09/08  Age: 83 y.o. MRN: [id number]  CC: The primary encounter diagnosis was Vitamin D deficiency. 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  HPI OLIVINE HIERS presents for FOLLOW UP. Has not been seen in over a year .  zoloft and trazodone refills denied until seen.  Endorses signs and symptoms of depression including irritabillty, social isolation , insomnia,  not sleeping well without trazodone. Stopped lipitor because  It was given her diarrhea .  No refills from this office since 2015!  medicap confirms  Still having diarrhea but less than once every 2 weeks .  Taking metformin , glipizide and NPH  Only gets pull ups 100  Per 90 days from [place] [facility] last May BP up, ok'd concurrent use of losartan and lisinopril No RAS  by July 2017 ultrasound medicatio nnoncompliance suspected  Has not taken losartan  I"a while"   Lab Results  Component Value Date   HGBA1C 7.5 (H) 11/12/2016   Still using N at night   20 units,  None in the morning .  And 5 mg glipizide twice daily  Fasting sugars range from 150 to 94.   Took BP medication this morning :  Atenolol  Bid and lisinopril .  Took both this morning .  Has not taken losartan hctZ in 2 weeks.    Outpatient Medications Prior to Visit  Medication Sig Dispense Refill  . ACCU-CHEK SMARTVIEW test strip USE 2 TIMES DAILY FOR DIABETIC TESTING 200 each 0  . acetaminophen (TYLENOL) 500 MG tablet Take 500 mg by mouth as needed.      Marland Kitchen atenolol (TENORMIN) 25 MG  tablet TAKE ONE TABLET TWICE DAILY 60 tablet 5  . B-D INS SYR ULTRAFINE .3CC/30G 30G X 1/2" 0.3 ML MISC USE AS DIRECTED 100 each 2  . B-D INS SYR ULTRAFINE .3CC/30G 30G X 1/2" 0.3 ML MISC USE AS DIRECTED 100 each 1  . glipiZIDE (GLUCOTROL) 5 MG tablet TAKE ONE TABLET TWICE DAILY AFTER MEALS 30 tablet 0  . HUMULIN N 100 UNIT/ML injection INJECT 20 UNITS AT BEDTIME AND 5 UNITS IN THE MORNING UNLESS BG<150 10 mL 4  . Incontinence Supply Disposable (DISPOSABLE BRIEF LARGE) MISC 1 application by Does not apply route 2 (two) times daily. 36 each 12  . lisinopril (PRINIVIL,ZESTRIL) 40 MG tablet TAKE ONE (1) TABLET BY MOUTH EVERY DAY 90 tablet 3  . losartan-hydrochlorothiazide (HYZAAR) 100-25 MG per tablet TAKE ONE (1) TABLET BY MOUTH EVERY DAY 30 tablet 6  . metFORMIN (GLUCOPHAGE) 850 MG tablet TAKE ONE TABLET TWICE DAILY WITH FOOD ASDIRECTED 30 tablet 0  . PHARMACIST CHOICE LANCETS MISC USE 2 TIMES DAILY FOR DIABETIC TESTING 200 each 0  . sertraline (ZOLOFT) 100 MG tablet TAKE ONE TABLET BY MOUTH EVERY NIGHT AT BEDTIME 90 tablet 1  . atenolol (TENORMIN) 50 MG tablet TAKE ONE (1) TABLET BY MOUTH TWO (2) TIMES DAILY (Patient not taking: Reported on 11/12/2016) 30 tablet 0  . atorvastatin (LIPITOR) 80 MG tablet Take 1 tablet (80 mg total) by mouth daily. (Patient not taking: Reported on 11/12/2016)  90 tablet 3  . lamoTRIgine (LAMICTAL) 100 MG tablet TAKE ONE TABLET BY MOUTH EVERY NIGHT AT BEDTIME (Patient not taking: Reported on 11/12/2016) 90 tablet 1  . benzonatate (TESSALON PERLES) 100 MG capsule Take 2 capsules (200 mg total) by mouth 3 (three) times daily as needed for cough. (Patient not taking: Reported on 11/12/2016) 30 capsule 1  . CRESTOR 20 MG tablet TAKE ONE (1) TABLET BY MOUTH EVERY DAY (Patient not taking: Reported on 09/27/2015) 30 tablet 6  . cyanocobalamin (,VITAMIN B-12,) 1000 MCG/ML injection Inject 1 mL (1,000 mcg total) into the muscle once. I ml IM injection weekly x 3, then monthly (Patient  not taking: Reported on 11/12/2016) 10 mL 0  . doxycycline (VIBRAMYCIN) 100 MG capsule Take 1 capsule (100 mg total) by mouth 2 (two) times daily. WITH FOOD (Patient not taking: Reported on 11/12/2016) 14 capsule 0  . ergocalciferol (DRISDOL) 50000 UNITS capsule Take 1 capsule (50,000 Units total) by mouth once a week. (Patient not taking: Reported on 11/12/2016) 12 capsule 0  . ergocalciferol (DRISDOL) 50000 units capsule Take 1 capsule (50,000 Units total) by mouth once a week. (Patient not taking: Reported on 11/12/2016) 12 capsule 0  . HYDROcodone-acetaminophen (NORCO/VICODIN) 5-325 MG per tablet Take 1 tablet by mouth daily as needed for pain. (Patient not taking: Reported on 09/27/2015) 30 tablet 3  . ibuprofen (ADVIL,MOTRIN) 200 MG tablet Take 200 mg by mouth every 6 (six) hours as needed for pain.    . meloxicam (MOBIC) 15 MG tablet Take 1 tablet (15 mg total) by mouth daily. (Patient not taking: Reported on 09/27/2015) 90 tablet 1  . predniSONE (DELTASONE) 10 MG tablet 6 tablets on Day 1 , then reduce by 1 tablet daily until gone. (Patient not taking: Reported on 11/12/2016) 21 tablet 0  . TDaP (BOOSTRIX) 5-2.5-18.5 LF-MCG/0.5 injection Inject 0.5 mLs into the muscle once. (Patient not taking: Reported on 09/27/2015) 0.5 mL 0  . Tdap (BOOSTRIX) 5-2.5-18.5 LF-MCG/0.5 injection Inject 0.5 mLs into the muscle once. (Patient not taking: Reported on 09/27/2015) 0.5 mL 0  . traZODone (DESYREL) 50 MG tablet Take 0.5-1 tablets (25-50 mg total) by mouth at bedtime as needed for sleep. (Patient not taking: Reported on 11/12/2016) 30 tablet 3   Facility-Administered Medications Prior to Visit  Medication Dose Route Frequency Provider Last Rate Last Dose  . cyanocobalamin ((VITAMIN B-12)) injection 1,000 mcg  1,000 mcg Intramuscular Once [name], MD        Review of Systems;  Patient denies headache, fevers, malaise, unintentional weight loss, skin rash, eye pain, sinus congestion and sinus pain, sore  throat, dysphagia,  hemoptysis , cough, dyspnea, wheezing, chest pain, palpitations, orthopnea, edema, abdominal pain, nausea, melena, diarrhea, constipation, flank pain, dysuria, hematuria, urinary  Frequency, nocturia, numbness, tingling, seizures,  Focal weakness, Loss of consciousness,  Tremor, insomnia, depression, anxiety, and suicidal ideation.      Objective:  There were no vitals taken for this visit.  BP Readings from Last 3 Encounters:  09/27/15 126/74  05/05/15 130/80  03/17/15 130/72    Wt Readings from Last 3 Encounters:  09/27/15 175 lb 8 oz (79.6 kg)  05/05/15 175 lb 1.9 oz (79.4 kg)  03/17/15 179 lb 12.8 oz (81.6 kg)    General appearance: alert, cooperative and appears stated age Ears: normal TM's and external ear canals both ears Throat: lips, mucosa, and tongue normal; teeth and gums normal Neck: no adenopathy, no carotid bruit, supple, symmetrical, trachea midline and thyroid not  enlarged, symmetric, no tenderness/mass/nodules Back: symmetric, no curvature. ROM normal. No CVA tenderness. Lungs: clear to auscultation bilaterally Heart: regular rate and rhythm, S1, S2 normal, no murmur, click, rub or gallop Abdomen: soft, non-tender; bowel sounds normal; no masses,  no organomegaly Pulses: 2+ and symmetric Skin: Skin color, texture, turgor normal. No rashes or lesions Lymph nodes: Cervical, supraclavicular, and axillary nodes normal.  Lab Results  Component Value Date   HGBA1C 7.5 (H) 11/12/2016   HGBA1C 6.8 (H) 09/27/2015   HGBA1C 6.8 (H) 03/17/2015    Lab Results  Component Value Date   CREATININE 1.16 11/12/2016   CREATININE 1.41 (H) 09/27/2015   CREATININE 1.18 (H) 03/17/2015    Lab Results  Component Value Date   WBC 11.3 (H) 11/12/2016   HGB 15.4 (H) 11/12/2016   HCT 47.5 (H) 11/12/2016   PLT 255.0 11/12/2016   GLUCOSE 166 (H) 11/12/2016   CHOL 247 (H) 11/12/2016   TRIG 249.0 (H) 11/12/2016   HDL 44.80 11/12/2016   LDLDIRECT 150.0  11/12/2016   LDLCALC 107 (H) 09/27/2015   ALT 11 11/12/2016   AST 13 11/12/2016   NA 142 11/12/2016   K 4.6 11/12/2016   CL 106 11/12/2016   CREATININE 1.16 11/12/2016   BUN 25 (H) 11/12/2016   CO2 27 11/12/2016   TSH 4.51 (H) 11/12/2016   HGBA1C 7.5 (H) 11/12/2016   MICROALBUR 7.4 (H) 11/12/2016     Assessment & Plan:   Problem List Items Addressed This Visit    Vitamin D deficiency - Primary   Relevant Orders   VITAMIN D 25 Hydroxy (Vit-D Deficiency, Fractures) (Completed)   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  Will add 5 units in the morning  She  is up-to-date on eye exams and foot exam is normal today.  Patient is tolerating statin therapy for CAD risk . She will resume  ACE/ARB for reduction in proteinuria.    Lab Results  Component Value Date   HGBA1C 7.5 (H) 11/12/2016   Lab Results  Component Value Date   MICROALBUR 7.4 (H) 11/12/2016               Tobacco abuse disorder    . Smoking cessation instruction/counseling given:  counseled patient on the dangers of tobacco use, advised patient to stop smoking, and reviewed strategies to maximize success      Major depressive disorder, recurrent episode with anxious distress ([place])    Symptoms have recurred with suspension of zoloft.  Resume medication , trazodone for insomnia      Relevant Medications   sertraline (ZOLOFT) 50 MG tablet   traZODone (DESYREL) 50 MG tablet   Other Relevant Orders   TSH (Completed)   Incontinence of urine    She continues to require adult diapers for mixed stress/urge incontinence.      Hypertension    uncontrolled due to patient nonadherence and lapse in regimen,  resume losartan       Relevant Orders   Comprehensive metabolic panel (Completed)   Hyperlipidemia   Relevant Orders     LDL cholesterol, direct (Completed)   Lipid panel (Completed)   Dystrophic nail    Referral to podiatry for management given history of diabetes       RESOLVED: DM (diabetes  mellitus), type 2, uncontrolled, with renal complications ([facility])   Relevant Orders   Hemoglobin A1c (Completed)   Microalbumin / creatinine urine ratio (Completed)   CBC with Differential/Platelet (Completed)   B12 deficiency   Relevant Orders   Vitamin B12 (Completed)      I have discontinued Ms. Tess's ibuprofen, HYDROcodone-acetaminophen, Tdap, cyanocobalamin, meloxicam, Tdap, CRESTOR, ergocalciferol, sertraline, benzonatate, predniSONE, doxycycline, and ergocalciferol. I am also having her start on sertraline. Additionally, I am having her maintain her acetaminophen, Disposable Brief Large, atorvastatin, losartan-hydrochlorothiazide, atenolol, ACCU-CHEK SMARTVIEW, PHARMACIST CHOICE LANCETS, lamoTRIgine, B-D INS SYR ULTRAFINE .3CC/30G, B-D INS SYR ULTRAFINE .3CC/30G, HUMULIN N, lisinopril, atenolol, glipiZIDE, metFORMIN, and traZODone. We will continue to administer cyanocobalamin.  Meds ordered this encounter  Medications  . sertraline (ZOLOFT) 50 MG tablet    Sig: Take 1 tablet (50 mg total) by mouth daily.    Dispense:  90 tablet    Refill:  1  . traZODone (DESYREL) 50 MG tablet    Sig: Take 0.5-1 tablets (25-50 mg total) by mouth at bedtime as needed for sleep.    Dispense:  30 tablet    Refill:  3    Medications Discontinued During This Encounter  Medication Reason  . benzonatate (TESSALON PERLES) 100 MG capsule Patient has not taken in last 30 days  . CRESTOR 20 MG tablet Patient has not taken in last 30 days  . cyanocobalamin (,VITAMIN B-12,) 1000 MCG/ML injection Patient has not taken in last 30 days  . doxycycline (VIBRAMYCIN) 100 MG capsule Therapy completed  . ergocalciferol (DRISDOL) 50000 UNITS capsule Therapy completed  . ergocalciferol (DRISDOL) 50000 units capsule Therapy completed  .  HYDROcodone-acetaminophen (NORCO/VICODIN) 5-325 MG per tablet Patient has not taken in last 30 days  . ibuprofen (ADVIL,MOTRIN) 200 MG tablet Patient has not taken in last 30 days  . meloxicam (MOBIC) 15 MG tablet Patient has not taken in last 30 days  . predniSONE (DELTASONE) 10 MG tablet Therapy completed  . TDaP (BOOSTRIX) 5-2.5-18.5 LF-MCG/0.5 injection Patient has not taken in last 30 days  . Tdap (BOOSTRIX) 5-2.5-18.5 LF-MCG/0.5 injection Patient has not taken in last 30 days  . sertraline (ZOLOFT) 100 MG tablet   . traZODone (DESYREL) 50 MG tablet Reorder    Follow-up: Return in about 6 months (around 05/15/2017).   [name], MD

## 2016-11-13 DIAGNOSIS — L603 Nail dystrophy: Secondary | ICD-10-CM | POA: Insufficient documentation

## 2016-11-13 NOTE — Assessment & Plan Note (Signed)
Symptoms have recurred with suspension of zoloft.  Resume medication , trazodone for insomnia

## 2016-11-13 NOTE — Assessment & Plan Note (Signed)
She continues to require adult diapers for mixed stress/urge incontinence.

## 2016-11-13 NOTE — Assessment & Plan Note (Signed)
Referral to podiatry for management given history of diabetes

## 2016-11-13 NOTE — Assessment & Plan Note (Signed)
uncontrolled due to patient nonadherence and lapse in regimen,  resume losartan

## 2016-11-13 NOTE — Assessment & Plan Note (Signed)
Smoking cessation instruction/counseling given:  counseled patient on the dangers of tobacco use, advised patient to stop smoking, and reviewed strategies to maximize success 

## 2016-11-13 NOTE — Assessment & Plan Note (Addendum)
She has lost control of her diabetes  Due to being lost to follow up on current regimen of metformin,  glipizide to 5 mg twice daily and NPH 20 units at bedtime .  Will add 5 units in the morning  She  is up-to-date on eye exams and foot exam is normal today.  Patient is tolerating statin therapy for CAD risk . She will resume  ACE/ARB for reduction in proteinuria.    Lab Results  Component Value Date   HGBA1C 7.5 (H) 11/12/2016   Lab Results  Component Value Date   MICROALBUR 7.4 (H) 11/12/2016

## 2016-11-14 ENCOUNTER — Other Ambulatory Visit: Payer: Self-pay | Admitting: Internal Medicine

## 2016-11-14 ENCOUNTER — Telehealth: Payer: Self-pay | Admitting: *Deleted

## 2016-11-14 MED ORDER — INSULIN NPH (HUMAN) (ISOPHANE) 100 UNIT/ML ~~LOC~~ SUSP: SUBCUTANEOUS | 4 refills | Status: DC

## 2016-11-14 MED ORDER — ERGOCALCIFEROL 1.25 MG (50000 UT) PO CAPS: 50000.0000 [IU] | ORAL_CAPSULE | ORAL | 2 refills | Status: DC

## 2016-11-14 NOTE — Addendum Note (Signed)
Addended by: [name] on: 11/14/2016 04:34 PM   Modules accepted: Orders

## 2016-11-14 NOTE — Telephone Encounter (Signed)
Patient stated that she suspected to have a medication change for her blood pressure after her last office visit . Patient requested lab results if available as well.  Pt contact [phone]

## 2016-11-14 NOTE — Progress Notes (Signed)
drisdol

## 2016-11-15 ENCOUNTER — Telehealth: Payer: Self-pay | Admitting: Internal Medicine

## 2016-11-15 NOTE — Telephone Encounter (Signed)
Pt called and stated that she needs a refill on her cyanocobalamin ((VITAMIN B-12)) injection 1,000 mcg. Please advise, thank you!  [place], [place]  Call pt @ [phone]

## 2016-11-16 ENCOUNTER — Other Ambulatory Visit: Payer: Self-pay | Admitting: Internal Medicine

## 2016-11-19 ENCOUNTER — Telehealth: Payer: Self-pay | Admitting: Internal Medicine

## 2016-11-19 NOTE — Telephone Encounter (Signed)
Pt called requesting a refill on her atorvastatin (LIPITOR) 80 MG tablet, lisinopril (PRINIVIL,ZESTRIL) 40 MG tablet,  and losartan-hydrochlorothiazide (HYZAAR) 100-25 MG per tablet, metFORMIN (GLUCOPHAGE) 850 MG tablet, glipiZIDE (GLUCOTROL) 5 MG tablet . Please advise, thank you!  Monica [name], Monica [name]  Call pt @ [phone]

## 2016-11-19 NOTE — Telephone Encounter (Signed)
Pt called back looking for a status update. She states that she has been out of this medication for a long time. Please advise,thank you!

## 2016-11-20 MED ORDER — LOSARTAN POTASSIUM-HCTZ 100-25 MG PO TABS: ORAL_TABLET | ORAL | 0 refills | Status: DC

## 2016-11-20 MED ORDER — METFORMIN HCL 850 MG PO TABS: ORAL_TABLET | ORAL | 0 refills | Status: DC

## 2016-11-20 MED ORDER — LISINOPRIL 40 MG PO TABS: ORAL_TABLET | ORAL | 0 refills | Status: DC

## 2016-11-20 MED ORDER — GLIPIZIDE 5 MG PO TABS: ORAL_TABLET | ORAL | 0 refills | Status: DC

## 2016-11-20 MED ORDER — ATORVASTATIN CALCIUM 80 MG PO TABS: 80.0000 mg | ORAL_TABLET | Freq: Every day | ORAL | 0 refills | Status: DC

## 2016-11-20 NOTE — Telephone Encounter (Signed)
yes

## 2016-11-20 NOTE — Telephone Encounter (Signed)
LMTCB. Need to let pt know that all medications have been sent in.

## 2016-11-20 NOTE — Telephone Encounter (Signed)
Is it ok to send this in. The pt stated that he daughter would be able to give them to her.

## 2016-11-20 NOTE — Telephone Encounter (Signed)
Please advise 

## 2016-11-20 NOTE — Telephone Encounter (Signed)
SHE WAS TO RESUME LOSARTAN/HCTZ

## 2016-11-21 MED ORDER — "SYRINGE LUER LOCK 25G X 1"" 3 ML MISC": 1.0000 | 1 refills | Status: DC

## 2016-11-21 MED ORDER — CYANOCOBALAMIN 1000 MCG/ML IJ SOLN: 1000.0000 ug | Freq: Once | INTRAMUSCULAR | 3 refills | Status: AC

## 2016-11-21 NOTE — Telephone Encounter (Signed)
Pt was notified that all medications have been sent in.

## 2016-11-21 NOTE — Telephone Encounter (Signed)
Spoke with pt and informed her that she is to be taking the losartan-hctz. The pt stated that she has ben taking the lisinopril and the atenolol but not the losartan-hctz. Explained to the pt and made it very clear that she is to be taking 3 blood pressure medications and they are lisinopril, atenolol, and losartan-hctz and that they have all been sent in to her pharmacy. Pt gave a verbal understanding.

## 2016-11-21 NOTE — Telephone Encounter (Signed)
Vitamin b12 and syringes have been sent and pt has been notified.

## 2016-12-20 ENCOUNTER — Other Ambulatory Visit: Payer: Self-pay | Admitting: Internal Medicine

## 2017-01-26 ENCOUNTER — Other Ambulatory Visit: Payer: Self-pay | Admitting: Internal Medicine

## 2017-01-28 ENCOUNTER — Other Ambulatory Visit: Payer: Self-pay | Admitting: Internal Medicine

## 2017-02-12 ENCOUNTER — Telehealth: Payer: Self-pay | Admitting: *Deleted

## 2017-02-12 DIAGNOSIS — IMO0002 Reserved for concepts with insufficient information to code with codable children: Secondary | ICD-10-CM

## 2017-02-12 DIAGNOSIS — E1165 Type 2 diabetes mellitus with hyperglycemia: Secondary | ICD-10-CM

## 2017-02-12 DIAGNOSIS — E559 Vitamin D deficiency, unspecified: Secondary | ICD-10-CM

## 2017-02-12 DIAGNOSIS — E7800 Pure hypercholesterolemia, unspecified: Secondary | ICD-10-CM

## 2017-02-12 DIAGNOSIS — E538 Deficiency of other specified B group vitamins: Secondary | ICD-10-CM

## 2017-02-12 DIAGNOSIS — E1121 Type 2 diabetes mellitus with diabetic nephropathy: Secondary | ICD-10-CM

## 2017-02-12 NOTE — Telephone Encounter (Signed)
Fasting labs ordered

## 2017-02-12 NOTE — Telephone Encounter (Signed)
Pt coming in for labs tomorrow (8/23). Please place future lab orders.  Thanks

## 2017-02-13 ENCOUNTER — Other Ambulatory Visit: Payer: Self-pay | Admitting: Internal Medicine

## 2017-02-13 ENCOUNTER — Other Ambulatory Visit (INDEPENDENT_AMBULATORY_CARE_PROVIDER_SITE_OTHER): Payer: Medicare Other

## 2017-02-13 DIAGNOSIS — E1121 Type 2 diabetes mellitus with diabetic nephropathy: Secondary | ICD-10-CM

## 2017-02-13 DIAGNOSIS — E1165 Type 2 diabetes mellitus with hyperglycemia: Secondary | ICD-10-CM | POA: Diagnosis not present

## 2017-02-13 DIAGNOSIS — E538 Deficiency of other specified B group vitamins: Secondary | ICD-10-CM

## 2017-02-13 DIAGNOSIS — E7800 Pure hypercholesterolemia, unspecified: Secondary | ICD-10-CM | POA: Diagnosis not present

## 2017-02-13 DIAGNOSIS — IMO0002 Reserved for concepts with insufficient information to code with codable children: Secondary | ICD-10-CM

## 2017-02-13 LAB — LIPID PANEL
Cholesterol: 235 mg/dL — ABNORMAL HIGH (ref 0–200)
HDL: 36.7 mg/dL — AB (ref >39.00)
NONHDL: 198.58
TRIGLYCERIDES: 228 mg/dL — AB (ref 0.0–149.0)
Total CHOL/HDL Ratio: 6
VLDL: 45.6 mg/dL — AB (ref 0.0–40.0)

## 2017-02-13 LAB — COMPREHENSIVE METABOLIC PANEL
ALBUMIN: 3.8 g/dL (ref 3.5–5.2)
ALK PHOS: 45 U/L (ref 39–117)
ALT: 9 U/L (ref 0–35)
AST: 12 U/L (ref 0–37)
BUN: 34 mg/dL — ABNORMAL HIGH (ref 6–23)
CHLORIDE: 104 meq/L (ref 96–112)
CO2: 26 mEq/L (ref 19–32)
Calcium: 10 mg/dL (ref 8.4–10.5)
Creatinine, Ser: 1.39 mg/dL — ABNORMAL HIGH (ref 0.40–1.20)
GFR: 38.29 mL/min — AB (ref >60.00)
Glucose, Bld: 167 mg/dL — ABNORMAL HIGH (ref 70–99)
POTASSIUM: 4.3 meq/L (ref 3.5–5.1)
SODIUM: 138 meq/L (ref 135–145)
TOTAL PROTEIN: 6.7 g/dL (ref 6.0–8.3)
Total Bilirubin: 0.4 mg/dL (ref 0.2–1.2)

## 2017-02-13 LAB — MICROALBUMIN / CREATININE URINE RATIO
Creatinine,U: 79.3 mg/dL
MICROALB UR: 2.2 mg/dL — AB (ref 0.0–1.9)
Microalb Creat Ratio: 2.8 mg/g (ref 0.0–30.0)

## 2017-02-13 LAB — HEMOGLOBIN A1C: HEMOGLOBIN A1C: 6.9 % — AB (ref 4.6–6.5)

## 2017-02-13 LAB — LDL CHOLESTEROL, DIRECT: Direct LDL: 150 mg/dL

## 2017-02-13 LAB — VITAMIN B12: Vitamin B-12: 200 pg/mL — ABNORMAL LOW (ref 211–911)

## 2017-02-14 ENCOUNTER — Telehealth: Payer: Self-pay | Admitting: Internal Medicine

## 2017-02-14 ENCOUNTER — Other Ambulatory Visit: Payer: Medicare Other

## 2017-02-14 NOTE — Telephone Encounter (Signed)
Left pt message asking to call Ebony Hail back directly at [phone] to schedule AWV. Thanks!  *NOTE* Last AWV 10/07/13

## 2017-02-18 ENCOUNTER — Other Ambulatory Visit: Payer: Self-pay | Admitting: Internal Medicine

## 2017-02-28 ENCOUNTER — Telehealth: Payer: Self-pay | Admitting: Internal Medicine

## 2017-02-28 NOTE — Telephone Encounter (Signed)
Pt called to get her lab results from 02/13/2017. Thank you!  Call pt @ [phone].

## 2017-03-02 ENCOUNTER — Other Ambulatory Visit: Payer: Self-pay | Admitting: Internal Medicine

## 2017-03-02 MED ORDER — "SYRINGE 25G X 1"" 3 ML MISC": 0 refills | Status: DC

## 2017-03-02 MED ORDER — CYANOCOBALAMIN 1000 MCG/ML IJ SOLN: 1000.0000 ug | INTRAMUSCULAR | 1 refills | Status: DC

## 2017-03-02 NOTE — Progress Notes (Signed)
cyan 

## 2017-03-04 NOTE — Telephone Encounter (Signed)
See result note message 

## 2017-03-20 ENCOUNTER — Telehealth: Payer: Self-pay | Admitting: Internal Medicine

## 2017-03-20 NOTE — Telephone Encounter (Signed)
Scheduled 03/25/17. Pt granddaughter aware

## 2017-03-20 NOTE — Telephone Encounter (Signed)
Spoke to pt who wanted me to call Granddaughter to scheduled AWV. Left message for Silvio Pate (granddaughter) to call Ebony Hail back at [phone]  NOTE* last AWV 10/07/13

## 2017-03-25 ENCOUNTER — Other Ambulatory Visit: Payer: Self-pay | Admitting: Internal Medicine

## 2017-03-25 ENCOUNTER — Ambulatory Visit: Payer: Medicare Other

## 2017-04-07 ENCOUNTER — Ambulatory Visit: Payer: Medicare Other

## 2017-05-02 ENCOUNTER — Other Ambulatory Visit: Payer: Self-pay | Admitting: Internal Medicine

## 2017-05-07 ENCOUNTER — Ambulatory Visit: Payer: Medicare Other

## 2017-05-16 ENCOUNTER — Other Ambulatory Visit: Payer: Self-pay | Admitting: Internal Medicine

## 2017-05-19 ENCOUNTER — Ambulatory Visit: Payer: Medicare Other | Admitting: Internal Medicine

## 2017-05-23 ENCOUNTER — Telehealth: Payer: Self-pay | Admitting: Internal Medicine

## 2017-05-23 NOTE — Telephone Encounter (Signed)
Please advise 

## 2017-05-23 NOTE — Telephone Encounter (Signed)
Copied from [place]. Topic: Quick Communication - See Telephone Encounter >> May 23, 2017  2:51 PM Antonieta Iba C wrote: CRM for notification. See Telephone encounter for:  05/23/17.    Merry Proud from [employer] (diabetic testing supply) [phone] REF # [id number] called in to follow up on the status of a Rx request for a heating pad. Please advise/ assist further.

## 2017-05-28 NOTE — Telephone Encounter (Signed)
LMTCB. Need to ask pt if she requested a heating pad from [employer] diabetic testing supply company.

## 2017-05-30 ENCOUNTER — Other Ambulatory Visit: Payer: Self-pay | Admitting: Internal Medicine

## 2017-05-30 ENCOUNTER — Telehealth: Payer: Self-pay | Admitting: *Deleted

## 2017-05-30 NOTE — Telephone Encounter (Signed)
LMTCB

## 2017-05-30 NOTE — Telephone Encounter (Signed)
Form has been put in shred box

## 2017-05-30 NOTE — Telephone Encounter (Signed)
Copied from [place] [id number]. Topic: General - Other >> May 30, 2017 12:28 PM Marin Olp L wrote: Reason for CRM: Patient is not requesting a heating pad from [employer] diabetic testing supply. Please be advised.

## 2017-05-30 NOTE — Telephone Encounter (Signed)
Pt called back ans stated that she did not request the heating pad. Form has been put in shred box.

## 2017-06-02 ENCOUNTER — Ambulatory Visit: Payer: Medicare Other | Admitting: Internal Medicine

## 2017-06-10 ENCOUNTER — Ambulatory Visit (INDEPENDENT_AMBULATORY_CARE_PROVIDER_SITE_OTHER): Payer: Medicare Other | Admitting: Internal Medicine

## 2017-06-10 ENCOUNTER — Encounter: Payer: Self-pay | Admitting: Internal Medicine

## 2017-06-10 VITALS — BP 134/70 | HR 54 | Temp 97.6°F | Resp 16 | Ht 65.0 in | Wt 180.2 lb

## 2017-06-10 DIAGNOSIS — E1165 Type 2 diabetes mellitus with hyperglycemia: Secondary | ICD-10-CM

## 2017-06-10 DIAGNOSIS — M25561 Pain in right knee: Secondary | ICD-10-CM

## 2017-06-10 DIAGNOSIS — R296 Repeated falls: Secondary | ICD-10-CM | POA: Diagnosis not present

## 2017-06-10 DIAGNOSIS — Z87891 Personal history of nicotine dependence: Secondary | ICD-10-CM

## 2017-06-10 DIAGNOSIS — E538 Deficiency of other specified B group vitamins: Secondary | ICD-10-CM | POA: Diagnosis not present

## 2017-06-10 DIAGNOSIS — E1121 Type 2 diabetes mellitus with diabetic nephropathy: Secondary | ICD-10-CM

## 2017-06-10 DIAGNOSIS — M25562 Pain in left knee: Secondary | ICD-10-CM | POA: Diagnosis not present

## 2017-06-10 DIAGNOSIS — J431 Panlobular emphysema: Secondary | ICD-10-CM

## 2017-06-10 DIAGNOSIS — G8929 Other chronic pain: Secondary | ICD-10-CM

## 2017-06-10 DIAGNOSIS — Z23 Encounter for immunization: Secondary | ICD-10-CM | POA: Diagnosis not present

## 2017-06-10 DIAGNOSIS — IMO0002 Reserved for concepts with insufficient information to code with codable children: Secondary | ICD-10-CM

## 2017-06-10 LAB — POCT GLYCOSYLATED HEMOGLOBIN (HGB A1C): HEMOGLOBIN A1C: 7.9

## 2017-06-10 MED ORDER — CYANOCOBALAMIN 1000 MCG/ML IJ SOLN: 1000.0000 ug | INTRAMUSCULAR | 1 refills | Status: DC

## 2017-06-10 MED ORDER — BLOOD GLUCOSE METER KIT: PACK | 0 refills | Status: DC

## 2017-06-10 MED ORDER — "SYRINGE 25G X 1"" 3 ML MISC": 0 refills | Status: DC

## 2017-06-10 MED ORDER — LAMOTRIGINE 25 MG PO TABS: 25.0000 mg | ORAL_TABLET | Freq: Every day | ORAL | 0 refills | Status: DC

## 2017-06-10 NOTE — Progress Notes (Signed)
Subjective:  Patient ID: Monica [name], female    DOB: 08-29-1929  Age: 83 y.o. MRN: [id number]  CC: The primary encounter diagnosis was Uncontrolled type II diabetes mellitus with nephropathy ([place]). Diagnoses of Encounter for immunization, Recurrent falls, B12 deficiency, Panlobular emphysema ([place]), History of tobacco abuse, and Bilateral chronic knee pain were also pertinent to this visit.  HPI Monica [name] presents for follow up on multiple issues including Type 2 DM with CKD. Depression.. Hypertension and chronic pain   Has not taken lamictal in months. She is having difficulty controlling her anger towards her husband.  I"I feel like killing hin I get so mad". Married for over 18 years, husband's behavior suggests he may have dementia with paranoia. Tries to prevent her from leaving the house,  Wants her to go to bed early with him,. Etc. Patient also tearful at the prospect of her great grand childre and granddaughter moving out/  Discussed resuming lamictal  .     Not taking B12 ; told by MAIL ORDER THEY could not fill it for home use.   2 FALLS LAST WEEK WHILE AT HOME . One occurred  While turning around, lost her balance,  The other while sitting on the edge of the bed, just slid off the bed onto the floor.  Denies dizziness.  Legs weak,   Knees hrurting more,  Using a walker when space provides, but was in a small space in bedroom where she couldn't use the walker.  Left shoulder was sore but otherwise ok    Taking atenolol and lisinopril for bp. No longer taking losartan per nephrology ( d/c per patient).  Not checking blood sugars.  Lost her glucometer over a month ago.   Quit smoking after last visit in May  Lab Results  Component Value Date   HGBA1C 7.9 06/10/2017     Outpatient Medications Prior to Visit  Medication Sig Dispense Refill  . ACCU-CHEK SMARTVIEW test strip USE 2 TIMES DAILY FOR DIABETIC TESTING 200 each 0  . acetaminophen (TYLENOL) 500 MG tablet Take 500 mg  by mouth as needed.      Marland Kitchen atenolol (TENORMIN) 25 MG tablet TAKE ONE TABLET TWICE DAILY 60 tablet 5  . atorvastatin (LIPITOR) 80 MG tablet TAKE ONE TABLET BY MOUTH EVERY DAY 90 tablet 2  . B-D INS SYR ULTRAFINE .3CC/30G 30G X 1/2" 0.3 ML MISC USE AS DIRECTED 100 each 2  . B-D INS SYR ULTRAFINE .3CC/30G 30G X 1/2" 0.3 ML MISC USE AS DIRECTED 100 each 2  . glipiZIDE (GLUCOTROL) 5 MG tablet TAKE ONE TABLET TWICE DAILY AFTER MEALS 180 tablet 0  . Incontinence Supply Disposable (DISPOSABLE BRIEF LARGE) MISC 1 application by Does not apply route 2 (two) times daily. 36 each 12  . insulin NPH Human (HUMULIN N) 100 UNIT/ML injection INJECT 20 UNITS AT BEDTIME AND 5 UNITS IN THE MORNING UNLESS BG<150 10 mL 4  . lisinopril (PRINIVIL,ZESTRIL) 40 MG tablet TAKE ONE (1) TABLET BY MOUTH EVERY DAY 90 tablet 0  . metFORMIN (GLUCOPHAGE) 850 MG tablet TAKE ONE TABLET BY MOUTH TWICE DAILY WITH FOOD AS DIRECTED 30 tablet 3  . PHARMACIST CHOICE LANCETS MISC USE 2 TIMES DAILY FOR DIABETIC TESTING 200 each 0  . sertraline (ZOLOFT) 50 MG tablet Take 1 tablet (50 mg total) by mouth daily. 90 tablet 1  . Syringe/Needle, Disp, (SYRINGE LUER LOCK) 25G X 1" 3 ML MISC 1 applicator by Does not apply route every 30 (thirty)  days. 16 each 1  . traZODone (DESYREL) 50 MG tablet Take 0.5-1 tablets (25-50 mg total) by mouth at bedtime as needed for sleep. 30 tablet 3  . Syringe/Needle, Disp, (SYRINGE 3CC/25GX1") 25G X 1" 3 ML MISC Use for b12 injections 50 each 0  . atenolol (TENORMIN) 50 MG tablet TAKE ONE TABLET BY MOUTH TWICE DAILY. (Patient not taking: Reported on 06/10/2017) 30 tablet 2  . cyanocobalamin (,VITAMIN B-12,) 1000 MCG/ML injection Inject 1 mL (1,000 mcg total) into the muscle once a week. X 3, then monthly (Patient not taking: Reported on 06/10/2017) 10 mL 1  . ergocalciferol (DRISDOL) 50000 units capsule Take 1 capsule (50,000 Units total) by mouth once a week. (Patient not taking: Reported on 06/10/2017) 4 capsule 2    . lamoTRIgine (LAMICTAL) 100 MG tablet TAKE ONE TABLET BY MOUTH EVERY NIGHT AT BEDTIME (Patient not taking: Reported on 06/10/2017) 90 tablet 1  . losartan-hydrochlorothiazide (HYZAAR) 100-25 MG tablet TAKE ONE (1) TABLET BY MOUTH EVERY DAY (Patient not taking: Reported on 06/10/2017) 90 tablet 0   Facility-Administered Medications Prior to Visit  Medication Dose Route Frequency Provider Last Rate Last Dose  . cyanocobalamin ((VITAMIN B-12)) injection 1,000 mcg  1,000 mcg Intramuscular Once [name], MD        Review of Systems;  Patient denies headache, fevers, malaise, unintentional weight loss, skin rash, eye pain, sinus congestion and sinus pain, sore throat, dysphagia,  hemoptysis , cough, dyspnea, wheezing, chest pain, palpitations, orthopnea, edema, abdominal pain, nausea, melena, diarrhea, constipation, flank pain, dysuria, hematuria, urinary  Frequency, nocturia, numbness, tingling, seizures,  Focal weakness, Loss of consciousness,  Tremor, insomnia, depression, anxiety, and suicidal ideation.      Objective:  BP 134/70 (BP Location: Left Arm, Patient Position: Sitting, Cuff Size: Normal)   Pulse (!) 54   Temp 97.6 F (36.4 C) (Oral)   Resp 16   Ht '5\' 5"'  (1.651 m)   Wt 180 lb 3.2 oz (81.7 kg)   SpO2 97%   BMI 29.99 kg/m   BP Readings from Last 3 Encounters:  06/10/17 134/70  09/27/15 126/74  05/05/15 130/80    Wt Readings from Last 3 Encounters:  06/10/17 180 lb 3.2 oz (81.7 kg)  09/27/15 175 lb 8 oz (79.6 kg)  05/05/15 175 lb 1.9 oz (79.4 kg)    General appearance: alert, cooperative and appears stated age Ears: normal TM's and external ear canals both ears Throat: lips, mucosa, and tongue normal; teeth and gums normal Neck: no adenopathy, no carotid bruit, supple, symmetrical, trachea midline and thyroid not enlarged, symmetric, no tenderness/mass/nodules Back: symmetric, no curvature. ROM normal. No CVA tenderness. Lungs: clear to auscultation  bilaterally Heart: regular rate and rhythm, S1, S2 normal, no murmur, click, rub or gallop Abdomen: soft, non-tender; bowel sounds normal; no masses,  no organomegaly Pulses: 2+ and symmetric Skin: Skin color, texture, turgor normal. No rashes or lesions Lymph nodes: Cervical, supraclavicular, and axillary nodes normal.  Lab Results  Component Value Date   HGBA1C 7.9 06/10/2017   HGBA1C 6.9 (H) 02/13/2017   HGBA1C 7.5 (H) 11/12/2016    Lab Results  Component Value Date   CREATININE 1.39 (H) 02/13/2017   CREATININE 1.16 11/12/2016   CREATININE 1.41 (H) 09/27/2015    Lab Results  Component Value Date   WBC 11.3 (H) 11/12/2016   HGB 15.4 (H) 11/12/2016   HCT 47.5 (H) 11/12/2016   PLT 255.0 11/12/2016   GLUCOSE 167 (H) 02/13/2017   CHOL 235 (  H) 02/13/2017   TRIG 228.0 (H) 02/13/2017   HDL 36.70 (L) 02/13/2017   LDLDIRECT 150.0 02/13/2017   LDLCALC 107 (H) 09/27/2015   ALT 9 02/13/2017   AST 12 02/13/2017   NA 138 02/13/2017   K 4.3 02/13/2017   CL 104 02/13/2017   CREATININE 1.39 (H) 02/13/2017   BUN 34 (H) 02/13/2017   CO2 26 02/13/2017   TSH 4.51 (H) 11/12/2016   HGBA1C 7.9 06/10/2017   MICROALBUR 2.2 (H) 02/13/2017    Dg Knee 4 Views W/patella Left  REASON FOR EXAM:    knee pain difficulty walking COMMENTS: PROCEDURE:     DXR - DXR KNEE LT COMP WITH OBLIQUES  - Feb 12 2013  3:24PM RESULT: Findings: There is near-complete joint space obliteration in the medial compartment. There is subchondral sclerosis and osteophytosis. Joint space narrowing is identified in the lateral compartment. There is no evidence of acute fracture or dislocation. IMPRESSION: 1. Severe osteoarthritic changes. 2. No evidence of acute osseous abnormalities. Thank you for the opportunity to contribute to the care of your patient.    Dg Knee Complete 4 Views Right  REASON FOR EXAM:    knee pain trouble bearing weight no trauma COMMENTS: PROCEDURE:     DXR - DXR KNEE RT COMP WITH OBLIQUES  - Feb 12 2013  3:24PM RESULT: Findings: Areas of marginal osteophytosis is identified involving the femoral condyles and tibial plateau region as well as osteophytosis involving the tibial spines. There is subchondral sclerosis involving the femoral condyles and tibial plateau regions. There is joint space narrowing. There is no evidence of acute fracture or dislocation. IMPRESSION:      Severe osteoarthritic changes without evidence of acute osseous abnormalities. Thank you for the opportunity to contribute to the care of your patient.     Assessment & Plan:   Problem List Items Addressed This Visit    B12 deficiency    She has not started home injections due to  Mail order delay in sending.  Sending rx to local pharmacy.  Granddaughter Regan to give injections       Bilateral chronic knee pain    Secondary to DJD .  She is not a candidate for surgery due to age and comorbidities.  Recommend use of tylenol 2000 mg daily ,  If not controlled will send to ortho for steroid injection      Relevant Medications   lamoTRIgine (LAMICTAL) 25 MG tablet   COPD (chronic obstructive pulmonary disease) ([facility])    Currently asymptomatic due to sedentary nontaxing lifestyle.  Has been tobacco abstinent since May 2018      History of tobacco abuse    I have congratulated her in quitting and encouraged her to remain abstinent       Recurrent falls    Occurring due to leg weakness.  She has refused physical therapy.  Continue use of walker.       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   Continue current regimen of metformin,  glipizide  5 mg twice daily, NPH 20 units at bedtime and  add 5 units in the morning  She  is up-to-date on eye exams and foot exam is normal today.  Patient is tolerating statin therapy for CAD risk  . She  will resume  ACE/ARB for reduction in proteinuria.    Lab Results  Component Value Date   HGBA1C 7.9 06/10/2017   Lab Results  Component Value Date   MICROALBUR 2.2 (H) 02/13/2017               Relevant Orders   POCT HgB A1C (Completed)    Other Visit Diagnoses    Encounter for immunization       Relevant Orders   Flu vaccine HIGH DOSE PF (Completed)     A total of 40 minutes was spent with patient more than half of which was spent in counseling patient on the above mentioned issues , reviewing and explaining recent labs and imaging studies done, and coordination of care. I have discontinued Royetta Crochet. Pitz's lamoTRIgine, ergocalciferol, and losartan-hydrochlorothiazide. I am also having her start on lamoTRIgine and blood glucose meter kit and supplies. Additionally, I am having her maintain her acetaminophen, Disposable Brief Large, atenolol, ACCU-CHEK SMARTVIEW, PHARMACIST CHOICE LANCETS, B-D INS SYR ULTRAFINE .3CC/30G, sertraline, traZODone, insulin NPH Human, glipiZIDE, lisinopril, Syringe Luer Lock, atorvastatin, metFORMIN, B-D INS SYR ULTRAFINE .3CC/30G, cyanocobalamin, and SYRINGE 3CC/25GX1". We will continue to administer cyanocobalamin.  Meds ordered this encounter  Medications  . cyanocobalamin (,VITAMIN B-12,) 1000 MCG/ML injection    Sig: Inject 1 mL (1,000 mcg total) into the muscle once a week. X 3, then monthly    Dispense:  10 mL    Refill:  1    FOR HOME ADMINISTRATION  . Syringe/Needle, Disp, (SYRINGE 3CC/25GX1") 25G X 1" 3 ML MISC    Sig: Use for b12 injections    Dispense:  50 each    Refill:  0    FOR HOME ADMINSTRATION  . lamoTRIgine (LAMICTAL) 25 MG tablet    Sig: Take 1 tablet (25 mg total) by mouth daily. At bedtime.  Increase to 2 tablets after 2 weeks    Dispense:  90 tablet    Refill:  0  . blood glucose meter kit and supplies    Sig: Dispense based on patient and insurance preference. Use up to four times daily as directed. (FOR ICD-10  E10.9, E11.9).    Dispense:  1 each    Refill:  0    Whichever one is covered by Insurance claims handler Specific Question:   Number of strips    Answer:   100    Order Specific Question:   Number of lancets    Answer:   100    Medications Discontinued During This Encounter  Medication Reason  . atenolol (TENORMIN) 50 MG tablet Duplicate  . ergocalciferol (DRISDOL) 50000 units capsule Completed Course  . cyanocobalamin (,VITAMIN B-12,) 1000 MCG/ML injection   . Syringe/Needle, Disp, (SYRINGE 3CC/25GX1") 25G X 1" 3 ML MISC Reorder  . lamoTRIgine (LAMICTAL) 100 MG tablet   . losartan-hydrochlorothiazide (HYZAAR) 100-25 MG tablet     Follow-up: Return in about 4 weeks (around 07/08/2017) for depression follow up, follow up diabetes.   [name], MD

## 2017-06-10 NOTE — Patient Instructions (Addendum)
Your diabetes is not under good control.  You need to Check your sugars once daily  At one of these times :  1) fasting in the morning   2) 2 hours after breakfast ,  Lunch or dinner )    Do not take your glipzide if you are going to skip a meal.    You can take up to 2000 mg of tylenol daily safely  (that's 4   fo the 500 mg tablets daily )   Your husband is having paranoia; this may be due to dementia, or due to a psychiatric disorder.   I want you to start taking lamictal again at bedtime. Start with 25 mg dose and take it every night.  Increase  the dose to 50 mg in two weeks if you are still getting really bad moods.   Continue zoloft as well

## 2017-06-11 DIAGNOSIS — R296 Repeated falls: Secondary | ICD-10-CM | POA: Insufficient documentation

## 2017-06-11 NOTE — Assessment & Plan Note (Signed)
She has lost control of her diabetes again but has not been checkiing her blood sugars so no dose adjustment can be made safely until more data is collected.   Continue current regimen of metformin,  glipizide  5 mg twice daily, NPH 20 units at bedtime and  add 5 units in the morning  She  is up-to-date on eye exams and foot exam is normal today.  Patient is tolerating statin therapy for CAD risk . She will resume  ACE/ARB for reduction in proteinuria.    Lab Results  Component Value Date   HGBA1C 7.9 06/10/2017   Lab Results  Component Value Date   MICROALBUR 2.2 (H) 02/13/2017

## 2017-06-11 NOTE — Assessment & Plan Note (Signed)
She has not started home injections due to  Mail order delay in sending.  Sending rx to local pharmacy.  Granddaughter Regan to give injections

## 2017-06-11 NOTE — Assessment & Plan Note (Signed)
Secondary to DJD .  She is not a candidate for surgery due to age and comorbidities.  Recommend use of tylenol 2000 mg daily ,  If not controlled will send to ortho for steroid injection

## 2017-06-11 NOTE — Assessment & Plan Note (Signed)
Occurring due to leg weakness.  She has refused physical therapy.  Continue use of walker.

## 2017-06-11 NOTE — Assessment & Plan Note (Signed)
I have congratulated her in quitting and encouraged her to remain abstinent

## 2017-06-11 NOTE — Assessment & Plan Note (Signed)
Currently asymptomatic due to sedentary nontaxing lifestyle.  Has been tobacco abstinent since May 2018

## 2017-06-14 ENCOUNTER — Other Ambulatory Visit: Payer: Self-pay | Admitting: Internal Medicine

## 2017-07-02 ENCOUNTER — Telehealth: Payer: Self-pay | Admitting: Internal Medicine

## 2017-07-02 NOTE — Telephone Encounter (Signed)
Please advise 

## 2017-07-02 NOTE — Telephone Encounter (Unsigned)
Copied from [place] [phone]. Topic: Inquiry >> Jul 02, 2017  2:48 PM Neva Seat wrote: [facility]  [phone] - called to check if the office received the fax for the - Request for Diabetic Supplies.  Please call them back to verify and if the request was approved.

## 2017-07-04 NOTE — Telephone Encounter (Signed)
Spoke with pt and she stated that she did not request any diabetic supplies from this company.

## 2017-08-05 NOTE — Telephone Encounter (Signed)
fyi

## 2017-08-05 NOTE — Telephone Encounter (Signed)
Elta Guadeloupe from [facility] [phone] is calling for update on pt supplies I advised him that someone had spoke with the pt and the patient states that she did not request supplies from this company Elta Guadeloupe the representative said that it was ok and that he was going to reach out to the patient

## 2017-08-07 ENCOUNTER — Other Ambulatory Visit: Payer: Self-pay | Admitting: Internal Medicine

## 2017-08-25 ENCOUNTER — Other Ambulatory Visit: Payer: Self-pay | Admitting: Internal Medicine

## 2017-09-04 ENCOUNTER — Telehealth: Payer: Self-pay | Admitting: Internal Medicine

## 2017-09-04 NOTE — Telephone Encounter (Signed)
Copied from [place] [id number]. Topic: Quick Communication - See Telephone Encounter >> Sep 04, 2017  3:18 PM Bea Graff, NT wrote: CRM for notification. See Telephone encounter for: Pt would like all her prescriptions sent to [facility] on [address]. Pt needs a refill now of lamoTRIgine (LAMICTAL). She is completley out.   09/04/17.

## 2017-09-05 MED ORDER — LAMOTRIGINE 25 MG PO TABS: 25.0000 mg | ORAL_TABLET | Freq: Every day | ORAL | 0 refills | Status: DC

## 2017-09-17 ENCOUNTER — Other Ambulatory Visit: Payer: Self-pay | Admitting: Internal Medicine

## 2017-09-19 ENCOUNTER — Telehealth: Payer: Self-pay | Admitting: Internal Medicine

## 2017-09-19 ENCOUNTER — Other Ambulatory Visit: Payer: Self-pay

## 2017-09-19 MED ORDER — LISINOPRIL 40 MG PO TABS: ORAL_TABLET | ORAL | 0 refills | Status: DC

## 2017-09-19 NOTE — Telephone Encounter (Signed)
Copied from [place] [phone]. Topic: Quick Communication - Rx Refill/Question >> Sep 19, 2017 11:25 AM Ahmed Prima L wrote: Medication: lisinopril (PRINIVIL,ZESTRIL) 40 MG tablet Has the patient contacted their pharmacy? No refills (Agent: If no, request that the patient contact the pharmacy for the refill.) Preferred Pharmacy (with phone number or street name): [facility], [place] Agent: Please be advised that RX refills may take up to 3 business days. We ask that you follow-up with your pharmacy.

## 2017-09-24 ENCOUNTER — Telehealth: Payer: Self-pay

## 2017-09-24 NOTE — Telephone Encounter (Signed)
LMTCB. Need to ask pt if she gets her incontinence supplies from a company called [facility] may speak with pt.

## 2017-10-03 ENCOUNTER — Ambulatory Visit: Payer: Medicare Other | Admitting: Internal Medicine

## 2017-10-03 DIAGNOSIS — Z0289 Encounter for other administrative examinations: Secondary | ICD-10-CM

## 2017-10-24 ENCOUNTER — Other Ambulatory Visit: Payer: Self-pay | Admitting: Internal Medicine

## 2017-10-31 ENCOUNTER — Telehealth: Payer: Self-pay | Admitting: Internal Medicine

## 2017-10-31 NOTE — Telephone Encounter (Signed)
Copied from [place] [phone]. Topic: Quick Communication - See Telephone Encounter >> Oct 31, 2017  1:51 PM Ether Griffins B wrote: CRM for notification. See Telephone encounter for: 10/31/17.  Melody with [facility] calling checking on the status of re certification paperwork for incontinence supplies for the pt. She said they faxed over the request on 09/23/17 and on 10/09/17. Her CB# [phone].

## 2017-10-31 NOTE — Telephone Encounter (Signed)
Please advise 

## 2017-11-05 NOTE — Telephone Encounter (Signed)
Spoke with Melody and they stated they would fax over another form for [place] to fill out.

## 2017-11-07 ENCOUNTER — Encounter: Payer: Self-pay | Admitting: Internal Medicine

## 2017-11-07 ENCOUNTER — Ambulatory Visit (INDEPENDENT_AMBULATORY_CARE_PROVIDER_SITE_OTHER): Payer: Medicare Other | Admitting: Internal Medicine

## 2017-11-07 VITALS — BP 144/86 | HR 70 | Temp 97.6°F | Resp 15 | Ht 65.0 in | Wt 174.1 lb

## 2017-11-07 DIAGNOSIS — Z780 Asymptomatic menopausal state: Secondary | ICD-10-CM | POA: Diagnosis not present

## 2017-11-07 DIAGNOSIS — F339 Major depressive disorder, recurrent, unspecified: Secondary | ICD-10-CM

## 2017-11-07 DIAGNOSIS — E1165 Type 2 diabetes mellitus with hyperglycemia: Secondary | ICD-10-CM | POA: Diagnosis not present

## 2017-11-07 DIAGNOSIS — E1121 Type 2 diabetes mellitus with diabetic nephropathy: Secondary | ICD-10-CM | POA: Diagnosis not present

## 2017-11-07 DIAGNOSIS — M25562 Pain in left knee: Secondary | ICD-10-CM | POA: Diagnosis not present

## 2017-11-07 DIAGNOSIS — G8929 Other chronic pain: Secondary | ICD-10-CM

## 2017-11-07 DIAGNOSIS — H9190 Unspecified hearing loss, unspecified ear: Secondary | ICD-10-CM | POA: Diagnosis not present

## 2017-11-07 DIAGNOSIS — E538 Deficiency of other specified B group vitamins: Secondary | ICD-10-CM

## 2017-11-07 DIAGNOSIS — N3941 Urge incontinence: Secondary | ICD-10-CM | POA: Diagnosis not present

## 2017-11-07 DIAGNOSIS — L603 Nail dystrophy: Secondary | ICD-10-CM

## 2017-11-07 DIAGNOSIS — M25561 Pain in right knee: Secondary | ICD-10-CM

## 2017-11-07 DIAGNOSIS — IMO0002 Reserved for concepts with insufficient information to code with codable children: Secondary | ICD-10-CM

## 2017-11-07 LAB — COMPREHENSIVE METABOLIC PANEL
ALT: 8 U/L (ref 0–35)
AST: 10 U/L (ref 0–37)
Albumin: 3.7 g/dL (ref 3.5–5.2)
Alkaline Phosphatase: 54 U/L (ref 39–117)
BUN: 26 mg/dL — ABNORMAL HIGH (ref 6–23)
CALCIUM: 9.5 mg/dL (ref 8.4–10.5)
CHLORIDE: 105 meq/L (ref 96–112)
CO2: 29 meq/L (ref 19–32)
CREATININE: 1.32 mg/dL — AB (ref 0.40–1.20)
GFR: 40.58 mL/min — AB (ref >60.00)
GLUCOSE: 130 mg/dL — AB (ref 70–99)
Potassium: 3.8 mEq/L (ref 3.5–5.1)
Sodium: 142 mEq/L (ref 135–145)
Total Bilirubin: 0.5 mg/dL (ref 0.2–1.2)
Total Protein: 6.9 g/dL (ref 6.0–8.3)

## 2017-11-07 LAB — MICROALBUMIN / CREATININE URINE RATIO
CREATININE, U: 116.4 mg/dL
MICROALB UR: 5.2 mg/dL — AB (ref 0.0–1.9)
MICROALB/CREAT RATIO: 4.5 mg/g (ref 0.0–30.0)

## 2017-11-07 LAB — HEMOGLOBIN A1C: Hgb A1c MFr Bld: 7 % — ABNORMAL HIGH (ref 4.6–6.5)

## 2017-11-07 MED ORDER — SERTRALINE HCL 50 MG PO TABS: 50.0000 mg | ORAL_TABLET | Freq: Every day | ORAL | 1 refills | Status: DC

## 2017-11-07 MED ORDER — LAMOTRIGINE 25 MG PO TABS: 50.0000 mg | ORAL_TABLET | Freq: Every day | ORAL | 1 refills | Status: DC

## 2017-11-07 NOTE — Progress Notes (Signed)
Subjective:  Patient ID: Monica [name], female    DOB: 04-Jan-1930  Age: 84 y.o. MRN: [id number]  CC: The primary encounter diagnosis was Postmenopausal estrogen deficiency. Diagnoses of Dystrophic nail, Bilateral chronic knee pain, Uncontrolled type II diabetes mellitus with nephropathy ([place]), Hearing loss, unspecified hearing loss type, unspecified laterality, Major depressive disorder, recurrent episode with anxious distress ([place]), B12 deficiency, and Urge incontinence of urine were also pertinent to this visit.  HPI Monica [name] presents for follow up on Type 2 DM and other chronic issues.  Last seen in December. Accompanied by adult granddaughter who lives with her   Cc:  bilateral knee pain,  Moderate to severe,  Discourages her  from walking.  Recent fall in home durimg a late night trip to kitchen.  Had a right  knee buckle and she fell onto knee and left shoulder recently.   Reviewed priro imaging studies showing moderate DJD.  Discussed ortho referral in [place] for steroid shots.    follow up on diabetes.  Patient  is following a low glycemic index diet and taking all prescribed medications regularly without side effects.  Fasting sugars have been under 140 most of the time and post prandials have been under 160 except on rare occasions. Patient is very sedentary and not intentionally trying to lose weight .  Patient has NOT had an eye exam in the l7 YEARS  And does not check feet regularly for signs of infection. She is unable to trim her toenails .   Patient does not walk barefoot outside,  And denies any numbness tingling or burning in feet. Patient is up to date on all recommended vaccinations She is taking NPH 20 units at night  None in the morning . Marland Kitchen    Granddaughter has noted hearing loss over the last 6 months and is requesting referral for hearing test . Patient willing to go.   Outpatient Medications Prior to Visit  Medication Sig Dispense Refill  . ACCU-CHEK SMARTVIEW test  strip USE 2 TIMES DAILY FOR DIABETIC TESTING 200 each 0  . acetaminophen (TYLENOL) 500 MG tablet Take 500 mg by mouth as needed.      Marland Kitchen atenolol (TENORMIN) 50 MG tablet TAKE ONE TABLET TWICE DAILY 90 tablet 1  . atorvastatin (LIPITOR) 80 MG tablet TAKE ONE TABLET BY MOUTH EVERY DAY 90 tablet 2  . B-D INS SYR ULTRAFINE .3CC/30G 30G X 1/2" 0.3 ML MISC USE AS DIRECTED 100 each 2  . B-D INS SYR ULTRAFINE .3CC/30G 30G X 1/2" 0.3 ML MISC USE AS DIRECTED 100 each 2  . blood glucose meter kit and supplies Dispense based on patient and insurance preference. Use up to four times daily as directed. (FOR ICD-10 E10.9, E11.9). 1 each 0  . cyanocobalamin (,VITAMIN B-12,) 1000 MCG/ML injection Inject 1 mL (1,000 mcg total) into the muscle once a week. X 3, then monthly 10 mL 1  . glipiZIDE (GLUCOTROL) 5 MG tablet TAKE ONE TABLET BY MOUTH TWICE DAILY BEFORE MEALS 180 tablet 1  . Incontinence Supply Disposable (DISPOSABLE BRIEF LARGE) MISC 1 application by Does not apply route 2 (two) times daily. 36 each 12  . insulin NPH Human (HUMULIN N) 100 UNIT/ML injection INJECT 20 UNITS AT BEDTIME AND INJECT 5 UNITS IN THE MORNING UNLESS BG IS LESS THAN 150 10 mL 2  . metFORMIN (GLUCOPHAGE) 850 MG tablet TAKE ONE TABLET BY MOUTH TWICE DAILY WITH FOOD AS DIRECTED. 30 tablet 3  . PHARMACIST CHOICE LANCETS  MISC USE 2 TIMES DAILY FOR DIABETIC TESTING 200 each 0  . Syringe/Needle, Disp, (SYRINGE 3CC/25GX1") 25G X 1" 3 ML MISC Use for b12 injections 50 each 0  . Syringe/Needle, Disp, (SYRINGE LUER LOCK) 25G X 1" 3 ML MISC 1 applicator by Does not apply route every 30 (thirty) days. 16 each 1  . lamoTRIgine (LAMICTAL) 25 MG tablet Take 1 tablet (25 mg total) by mouth daily. At bedtime.  Increase to 2 tablets after 2 weeks 90 tablet 0  . traZODone (DESYREL) 50 MG tablet Take 0.5-1 tablets (25-50 mg total) by mouth at bedtime as needed for sleep. 30 tablet 3  . lisinopril (PRINIVIL,ZESTRIL) 40 MG tablet TAKE ONE (1) TABLET BY MOUTH  EVERY DAY 90 tablet 0  . atenolol (TENORMIN) 25 MG tablet TAKE ONE TABLET TWICE DAILY (Patient not taking: Reported on 11/07/2017) 60 tablet 5  . sertraline (ZOLOFT) 50 MG tablet Take 1 tablet (50 mg total) by mouth daily. (Patient not taking: Reported on 11/07/2017) 90 tablet 1   Facility-Administered Medications Prior to Visit  Medication Dose Route Frequency Provider Last Rate Last Dose  . cyanocobalamin ((VITAMIN B-12)) injection 1,000 mcg  1,000 mcg Intramuscular Once [name], MD        Review of Systems;  Patient denies headache, fevers, malaise, unintentional weight loss, skin rash, eye pain, sinus congestion and sinus pain, sore throat, dysphagia,  hemoptysis , cough, dyspnea, wheezing, chest pain, palpitations, orthopnea, edema, abdominal pain, nausea, melena, diarrhea, constipation, flank pain, dysuria, hematuria, urinary  Frequency, nocturia, numbness, tingling, seizures,  Focal weakness, Loss of consciousness,  Tremor, insomnia, depression, anxiety, and suicidal ideation.      Objective:  BP (!) 144/86 (BP Location: Left Arm, Patient Position: Sitting, Cuff Size: Large)   Pulse 70   Temp 97.6 F (36.4 C) (Oral)   Resp 15   Ht '5\' 5"'  (1.651 m)   Wt 174 lb 1.9 oz (79 kg)   SpO2 94%   BMI 28.98 kg/m   BP Readings from Last 3 Encounters:  11/07/17 (!) 144/86  06/10/17 134/70  09/27/15 126/74    Wt Readings from Last 3 Encounters:  11/07/17 174 lb 1.9 oz (79 kg)  06/10/17 180 lb 3.2 oz (81.7 kg)  09/27/15 175 lb 8 oz (79.6 kg)    General appearance: alert, cooperative and appears stated age Ears: normal TM's and external ear canals both ears Throat: lips, mucosa, and tongue normal; teeth and gums normal Neck: no adenopathy, no carotid bruit, supple, symmetrical, trachea midline and thyroid not enlarged, symmetric, no tenderness/mass/nodules Back: symmetric, no curvature. ROM normal. No CVA tenderness. Lungs: clear to auscultation bilaterally Heart: regular  rate and rhythm, S1, S2 normal, no murmur, click, rub or gallop Abdomen: soft, non-tender; bowel sounds normal; no masses,  no organomegaly Pulses: 2+ and symmetric Skin: Skin color, texture, turgor normal. No rashes or lesions MSK"  Severe arthritic changes to both knees,  Crepitus without effusion noted.  Lymph nodes: Cervical, supraclavicular, and axillary nodes normal.  Lab Results  Component Value Date   HGBA1C 7.0 (H) 11/07/2017   HGBA1C 7.9 06/10/2017   HGBA1C 6.9 (H) 02/13/2017    Lab Results  Component Value Date   CREATININE 1.32 (H) 11/07/2017   CREATININE 1.39 (H) 02/13/2017   CREATININE 1.16 11/12/2016    Lab Results  Component Value Date   WBC 11.3 (H) 11/12/2016   HGB 15.4 (H) 11/12/2016   HCT 47.5 (H) 11/12/2016   PLT 255.0 11/12/2016  GLUCOSE 130 (H) 11/07/2017   CHOL 235 (H) 02/13/2017   TRIG 228.0 (H) 02/13/2017   HDL 36.70 (L) 02/13/2017   LDLDIRECT 150.0 02/13/2017   LDLCALC 107 (H) 09/27/2015   ALT 8 11/07/2017   AST 10 11/07/2017   NA 142 11/07/2017   K 3.8 11/07/2017   CL 105 11/07/2017   CREATININE 1.32 (H) 11/07/2017   BUN 26 (H) 11/07/2017   CO2 29 11/07/2017   TSH 4.51 (H) 11/12/2016   HGBA1C 7.0 (H) 11/07/2017   MICROALBUR 5.2 (H) 11/07/2017     Assessment & Plan:   Problem List Items Addressed This Visit    Uncontrolled type II diabetes mellitus with nephropathy ([place])    She has improved control of her diabetes again b Continue current regimen of metformin,  glipizide  5 mg twice daily, NPH 20 units at bedtime.   She  is not up-to-date on eye exams and foot exam is normal today.  Patient is tolerating statin therapy for CAD risk .and  ACE/ARB for reduction in proteinuria.    Lab Results  Component Value Date   HGBA1C 7.0 (H) 11/07/2017   Lab Results  Component Value Date   MICROALBUR 5.2 (H) 11/07/2017               Relevant Orders   Comprehensive metabolic panel (Completed)   Hemoglobin A1c (Completed)    Microalbumin / creatinine urine ratio (Completed)   Major depressive disorder, recurrent episode with anxious distress (H[facility])    Mood has been more stable with addition of lamictal to sertraline.  No changes today.       Relevant Medications   sertraline (ZOLOFT) 50 MG tablet   Incontinence of urine    She continues to require adult diapers for mixed stress/urge incontinence.      Dystrophic nail    Podiatry referral needed.       Relevant Orders   Ambulatory referral to Podiatry   Bilateral chronic knee pain    Secondary to DJD .  She is not a candidate for surgery due to age and comorbidities.  Pain is not well controlled with regular use of tylenol 2000 mg daily ,  will send to ortho for steroid injection      Relevant Medications   lamoTRIgine (LAMICTAL) 25 MG tablet   sertraline (ZOLOFT) 50 MG tablet   Other Relevant Orders   Ambulatory referral to Orthopedic Surgery   B12 deficiency    Monthly injections are done  by granddaughter [place]   Lab Results  Component Value Date   VITAMINB12 200 (L) 02/13/2017          Other Visit Diagnoses    Postmenopausal estrogen deficiency    -  Primary   Relevant Orders   DG Bone Density   Hearing loss, unspecified hearing loss type, unspecified laterality       Relevant Orders   Ambulatory referral to Audiology     A total of 25 minutes of face to face time was spent with patient more than half of which was spent in counselling about the above mentioned conditions  and coordination of care  I have discontinued Stanton Kidney E. Hittle's traZODone. I have also changed her lamoTRIgine. Additionally, I am having her maintain her acetaminophen, Disposable Brief Large, ACCU-CHEK SMARTVIEW, PHARMACIST CHOICE LANCETS, B-D INS SYR ULTRAFINE .3CC/30G, Syringe Luer Lock, atorvastatin, B-D INS SYR ULTRAFINE .3CC/30G, cyanocobalamin, SYRINGE 3CC/25GX1", blood glucose meter kit and supplies, atenolol, metFORMIN, glipiZIDE, lisinopril, insulin NPH Human,  and sertraline. We will continue to administer cyanocobalamin.  Meds ordered this encounter  Medications  . lamoTRIgine (LAMICTAL) 25 MG tablet    Sig: Take 2 tablets (50 mg total) by mouth daily. At bedtime.    Dispense:  180 tablet    Refill:  1  . sertraline (ZOLOFT) 50 MG tablet    Sig: Take 1 tablet (50 mg total) by mouth daily.    Dispense:  90 tablet    Refill:  1    Medications Discontinued During This Encounter  Medication Reason  . atenolol (TENORMIN) 25 MG tablet Change in therapy  . lamoTRIgine (LAMICTAL) 25 MG tablet   . sertraline (ZOLOFT) 50 MG tablet Reorder  . traZODone (DESYREL) 50 MG tablet     Follow-up: Return in about 6 months (around 05/10/2018) for follow up diabetes.   [name], MD

## 2017-11-07 NOTE — Patient Instructions (Addendum)
Resume sertraline (zoloft) starting with 1/2 tablet after dinner for one week..  Then increase to full tabetl after that  Continue lamictal 2 tablets at bedtime   I am making a referral to Orthopedist for your knees,  Podiatry for your toenails,  And a hearing test  At [facility]

## 2017-11-09 ENCOUNTER — Other Ambulatory Visit: Payer: Self-pay | Admitting: Internal Medicine

## 2017-11-09 NOTE — Assessment & Plan Note (Signed)
Secondary to DJD .  She is not a candidate for surgery due to age and comorbidities.  Pain is not well controlled with regular use of tylenol 2000 mg daily ,  will send to ortho for steroid injection

## 2017-11-09 NOTE — Assessment & Plan Note (Signed)
She has improved control of her diabetes again b Continue current regimen of metformin,  glipizide  5 mg twice daily, NPH 20 units at bedtime.   She  is not up-to-date on eye exams and foot exam is normal today.  Patient is tolerating statin therapy for CAD risk .and  ACE/ARB for reduction in proteinuria.    Lab Results  Component Value Date   HGBA1C 7.0 (H) 11/07/2017   Lab Results  Component Value Date   MICROALBUR 5.2 (H) 11/07/2017

## 2017-11-09 NOTE — Assessment & Plan Note (Signed)
Podiatry referral needed?

## 2017-11-09 NOTE — Assessment & Plan Note (Signed)
Mood has been more stable with addition of lamictal to sertraline.  No changes today.

## 2017-11-09 NOTE — Assessment & Plan Note (Signed)
She continues to require adult diapers for mixed stress/urge incontinence.

## 2017-11-09 NOTE — Assessment & Plan Note (Signed)
Monthly injections are done  by granddaughter [place]   Lab Results  Component Value Date   VITAMINB12 200 (L) 02/13/2017

## 2017-11-10 ENCOUNTER — Other Ambulatory Visit: Payer: Self-pay | Admitting: Internal Medicine

## 2017-11-11 ENCOUNTER — Telehealth: Payer: Self-pay | Admitting: Internal Medicine

## 2017-11-11 ENCOUNTER — Other Ambulatory Visit: Payer: Self-pay | Admitting: Internal Medicine

## 2017-11-11 NOTE — Telephone Encounter (Signed)
See lab result message.  

## 2017-11-11 NOTE — Telephone Encounter (Signed)
Copied from [place] [phone]. Topic: Quick Communication - Lab Results >> Nov 11, 2017 12:12 PM Scherrie Gerlach wrote: Pt states she is returning a call.  Saw Dr Derrel Nip 5/17. Please call back. I believe it is lab results.

## 2017-11-12 ENCOUNTER — Telehealth: Payer: Self-pay | Admitting: Internal Medicine

## 2017-11-12 NOTE — Telephone Encounter (Signed)
Copied from [place] [phone]. Topic: Quick Communication - See Telephone Encounter >> Nov 12, 2017  3:16 PM Rutherford Nail, [place] wrote: CRM for notification. See Telephone encounter for: 11/12/17. Patient calling and states that she just got off the phone with [facility] and they have not received any paperwork for the patient's pull ups. Please advise CB#: [phone]

## 2017-11-12 NOTE — Telephone Encounter (Signed)
Please advise 

## 2017-11-18 NOTE — Telephone Encounter (Signed)
Leigh with [employer] [facility] is calling to f/u on form to be returned for incontinence supplies. Fax # [phone]. She states it was refaxed to the office at [phone] 11/05/17 by Melody. Marliss Czar is refaxing again today. Pt cannot get supplies until form is completed and returned.

## 2017-11-19 NOTE — Telephone Encounter (Signed)
Order has been refaxed and pt has been notified.

## 2017-12-15 DIAGNOSIS — M25562 Pain in left knee: Secondary | ICD-10-CM | POA: Diagnosis not present

## 2017-12-15 DIAGNOSIS — M25561 Pain in right knee: Secondary | ICD-10-CM | POA: Diagnosis not present

## 2017-12-15 DIAGNOSIS — G8929 Other chronic pain: Secondary | ICD-10-CM | POA: Diagnosis not present

## 2017-12-15 DIAGNOSIS — M170 Bilateral primary osteoarthritis of knee: Secondary | ICD-10-CM | POA: Diagnosis not present

## 2017-12-28 ENCOUNTER — Other Ambulatory Visit: Payer: Self-pay | Admitting: Internal Medicine

## 2017-12-30 ENCOUNTER — Ambulatory Visit: Payer: Medicare Other | Attending: Internal Medicine

## 2018-01-31 ENCOUNTER — Other Ambulatory Visit: Payer: Self-pay | Admitting: Internal Medicine

## 2018-03-05 ENCOUNTER — Other Ambulatory Visit: Payer: Self-pay | Admitting: Internal Medicine

## 2018-03-07 ENCOUNTER — Other Ambulatory Visit: Payer: Self-pay | Admitting: Internal Medicine

## 2018-03-14 ENCOUNTER — Other Ambulatory Visit: Payer: Self-pay | Admitting: Internal Medicine

## 2018-04-09 ENCOUNTER — Other Ambulatory Visit: Payer: Self-pay | Admitting: Internal Medicine

## 2018-04-09 ENCOUNTER — Telehealth: Payer: Self-pay | Admitting: Internal Medicine

## 2018-04-09 MED ORDER — INSULIN NPH (HUMAN) (ISOPHANE) 100 UNIT/ML ~~LOC~~ SUSP: SUBCUTANEOUS | 0 refills | Status: DC

## 2018-04-09 NOTE — Telephone Encounter (Signed)
Copied from [place] [phone]. Topic: General - Other >> Apr 09, 2018 12:46 PM Ivar Drape wrote: Reason for CRM:   Patient wanted the provider to know her insulin bottle dropped and broke so she doesn't have anymore insulin to take. Please advise.

## 2018-04-09 NOTE — Telephone Encounter (Signed)
Sent a refill to [facility] on [address].

## 2018-04-09 NOTE — Addendum Note (Signed)
Addended by: Adair Laundry on: 04/09/2018 03:04 PM   Modules accepted: Orders

## 2018-04-21 ENCOUNTER — Other Ambulatory Visit: Payer: Self-pay | Admitting: Internal Medicine

## 2018-04-30 ENCOUNTER — Other Ambulatory Visit: Payer: Self-pay

## 2018-04-30 ENCOUNTER — Telehealth: Payer: Self-pay

## 2018-04-30 NOTE — Patient Outreach (Signed)
[facility]) Care Management  04/30/2018  Monica [name] October 22, 1929 [id number]   Medication Adherence call to Monica [name] spoke with patient she is due on Lisinopril 40 mg a blood pressure medication  she is not sure is she is supposed to be taking this medication she ask if we can call doctor T[place] and verify  And clarify if patient is still taking this medication .left a message at doctor's office they will call back.Monica [name] is showing past due under [facility] ins.   [place] Management Direct Dial [phone]  Fax [phone] [email or web address]

## 2018-04-30 NOTE — Telephone Encounter (Signed)
Copied from [place] [phone]. Topic: Quick Communication - See Telephone Encounter >> Apr 30, 2018  3:45 PM Berneta Levins wrote: CRM for notification. See Telephone encounter for: 04/30/18.  Vicente Males from [employer] was doing medication adherence on lisinopril (PRINIVIL,ZESTRIL) 40 MG tablet.  Pt has not been taking this medication, last time RX was filled was in July for a 90 day supply.  Pt told Vicente Males that pharmacy didn't fill it so she hasn't been taking it. Vicente Males can be reached at [phone]  *I called Vicente Males & stated that lisinopril was still on current med list. Patient thought since pharmacy didn't fill it that she wasn't supposed to be taking it. It was sent to [facility] in [place], but more recent scripts have been [facility] [place] asked that it be reiterated to patient on 11/17 appt that she should be taking this medication. I asked that they give [place] a call back if [facility] needed [facility].*

## 2018-05-11 ENCOUNTER — Ambulatory Visit: Payer: Medicare Other | Admitting: Internal Medicine

## 2018-05-20 ENCOUNTER — Ambulatory Visit: Payer: Medicare Other | Admitting: Internal Medicine

## 2018-06-04 ENCOUNTER — Other Ambulatory Visit: Payer: Self-pay | Admitting: Internal Medicine

## 2018-06-05 ENCOUNTER — Other Ambulatory Visit: Payer: Self-pay | Admitting: Internal Medicine

## 2018-06-19 ENCOUNTER — Other Ambulatory Visit: Payer: Self-pay | Admitting: Internal Medicine

## 2018-06-23 ENCOUNTER — Other Ambulatory Visit: Payer: Self-pay | Admitting: Internal Medicine

## 2018-07-04 ENCOUNTER — Other Ambulatory Visit: Payer: Self-pay | Admitting: Internal Medicine

## 2018-07-06 NOTE — Telephone Encounter (Signed)
Refilled: 11/07/2017 Last OV: 11/07/2017 Next OV: not scheduled

## 2018-07-20 ENCOUNTER — Emergency Department
Admission: EM | Admit: 2018-07-20 | Discharge: 2018-07-21 | Disposition: A | Discharge status: Home / Self Care | Payer: Medicare Other | Attending: Emergency Medicine | Admitting: Emergency Medicine

## 2018-07-20 ENCOUNTER — Emergency Department: Payer: Medicare Other

## 2018-07-20 ENCOUNTER — Other Ambulatory Visit: Payer: Self-pay

## 2018-07-20 DIAGNOSIS — F17210 Nicotine dependence, cigarettes, uncomplicated: Secondary | ICD-10-CM | POA: Diagnosis not present

## 2018-07-20 DIAGNOSIS — M25552 Pain in left hip: Secondary | ICD-10-CM

## 2018-07-20 DIAGNOSIS — E119 Type 2 diabetes mellitus without complications: Secondary | ICD-10-CM | POA: Insufficient documentation

## 2018-07-20 DIAGNOSIS — Z794 Long term (current) use of insulin: Secondary | ICD-10-CM | POA: Diagnosis not present

## 2018-07-20 DIAGNOSIS — M5416 Radiculopathy, lumbar region: Secondary | ICD-10-CM | POA: Diagnosis not present

## 2018-07-20 DIAGNOSIS — I10 Essential (primary) hypertension: Secondary | ICD-10-CM | POA: Diagnosis not present

## 2018-07-20 DIAGNOSIS — M79662 Pain in left lower leg: Secondary | ICD-10-CM | POA: Diagnosis not present

## 2018-07-20 DIAGNOSIS — J449 Chronic obstructive pulmonary disease, unspecified: Secondary | ICD-10-CM | POA: Diagnosis not present

## 2018-07-20 DIAGNOSIS — M545 Low back pain: Secondary | ICD-10-CM | POA: Diagnosis not present

## 2018-07-20 DIAGNOSIS — S3992XA Unspecified injury of lower back, initial encounter: Secondary | ICD-10-CM | POA: Diagnosis not present

## 2018-07-20 DIAGNOSIS — Z79899 Other long term (current) drug therapy: Secondary | ICD-10-CM | POA: Diagnosis not present

## 2018-07-20 MED ORDER — DICLOFENAC SODIUM 1 % TD GEL: 4.0000 g | Freq: Four times a day (QID) | TRANSDERMAL | 1 refills | Status: DC

## 2018-07-20 MED ORDER — MORPHINE SULFATE (PF) 4 MG/ML IV SOLN
4.0000 mg | Freq: Once | INTRAVENOUS | Status: AC
  Administered 2018-07-20: 4 mg via INTRAVENOUS
  Filled 2018-07-20: qty 1

## 2018-07-20 MED ORDER — LORAZEPAM 2 MG/ML IJ SOLN
1.0000 mg | Freq: Once | INTRAMUSCULAR | Status: AC
  Administered 2018-07-20: 1 mg via INTRAVENOUS
  Filled 2018-07-20: qty 1

## 2018-07-20 NOTE — ED Provider Notes (Signed)
[facility] Emergency Department Provider Note   ____________________________________________   First MD Initiated Contact with Patient 07/20/18 1756     (approximate)  I have reviewed the triage vital signs and the nursing notes.   HISTORY  Chief Complaint Hip Pain    HPI Monica [name] is a 85 y.o. female patient reports she fell 4 days ago and since then her left hip is been hurting too much she cannot walk and bear weight on she cannot sit on it either.  She also says that before that happened in her left wrist swelled up for a few days it went down and then she fell and hurt her hip.  Her wrist is not swollen or tender any longer she did not fall and hit the wrist.  She did not have any history of gout.  Patient did not hit her head.  She does not have a headache she is not having any dizziness or blurry vision nausea vomiting or any other complaints that would be consistent with any head injury. Past Medical History:  Diagnosis Date  . B12 deficiency   . COPD (chronic obstructive pulmonary disease) ([place])   . Diabetes mellitus   . H/O: Bell's palsy   . Hyperlipidemia   . Hypertension   . Lung mass April 2011   s/p biopsy, no cancer cells found, Repeat CT Dec 2012 unchanged    Patient Active Problem List   Diagnosis Date Noted  . Recurrent falls 06/11/2017  . Dystrophic nail 11/13/2016  . Allergic rhinitis 09/30/2015  . Major depressive disorder, recurrent episode with anxious distress ([place]) 10/10/2013  . Vitamin D deficiency 10/08/2013  . Bilateral chronic knee pain 09/21/2011  . Tobacco abuse counseling 07/08/2011  . Incontinence of urine 06/13/2011  . Uncontrolled type II diabetes mellitus with nephropathy ([place]) 06/11/2011  . History of tobacco abuse 06/11/2011  . Hyperlipidemia   . Hypertension   . COPD (chronic obstructive pulmonary disease) ([place])   . B12 deficiency   . Lung mass     Past Surgical History:  Procedure Laterality Date   . ABDOMINAL HYSTERECTOMY    . CHOLECYSTECTOMY    . LUNG BIOPSY  2012   for positive PET, negative biopsy for malignancy  . SPINE SURGERY  2001   Lumbar    Prior to Admission medications   Medication Sig Start Date End Date Taking? Authorizing Provider  ACCU-CHEK SMARTVIEW test strip USE 2 TIMES DAILY FOR DIABETIC TESTING 12/08/14   [name], MD  acetaminophen (TYLENOL) 500 MG tablet Take 500 mg by mouth as needed.      [provider]  atenolol (TENORMIN) 50 MG tablet TAKE 1 TABLET BY MOUTH TWICE A DAY 06/05/18   [name], MD  atorvastatin (LIPITOR) 80 MG tablet TAKE ONE TABLET BY MOUTH EVERY DAY 02/19/17   [name], MD  B-D INS SYR ULTRAFINE .3CC/30G 30G X 1/2" 0.3 ML MISC USE AS DIRECTED 04/12/16   [name], MD  B-D INS SYR ULTRAFINE .3CC/30G 30G X 1/2" 0.3 ML MISC USE AS DIRECTED 05/19/17   [name], MD  BD INSULIN SYRINGE U/F 30G X 1/2" 0.5 ML MISC USE AS DIRECTED 03/17/18   [name], MD  blood glucose meter kit and supplies Dispense based on patient and insurance preference. Use up to four times daily as directed. (FOR ICD-10 E10.9, E11.9). 06/10/17   [name], MD  cyanocobalamin (,VITAMIN B-12,) 1000 MCG/ML injection  INJECT 1ML INTRAMUSCULAR ONCE WEEKLY FOR 3 WEEKS THEN ONCE A MONTH 06/19/18   [name], MD  glipiZIDE (GLUCOTROL) 5 MG tablet TAKE ONE TABLET BY MOUTH TWICE DAILY BEFORE MEALS 04/22/18   [name], MD  Incontinence Supply Disposable (DISPOSABLE BRIEF LARGE) MISC 1 application by Does not apply route 2 (two) times daily. 06/11/11   [name], MD  insulin NPH Human (HUMULIN N) 100 UNIT/ML injection INJECT 20 UNITS AT BEDTIME AND 5 UNITS THE THE MORNING UNLESS BG IS LESS THAN 150 06/23/18   [name], MD  lamoTRIgine (LAMICTAL) 25 MG tablet Take 2 tablets (50 mg total) by mouth at bedtime. 07/06/18   [name], MD  lisinopril (PRINIVIL,ZESTRIL) 40 MG tablet TAKE 1 TABLET BY MOUTH DAILY 12/29/17    [name], MD  metFORMIN (GLUCOPHAGE) 850 MG tablet TAKE ONE TABLET BY MOUTH TWICE DAILY WITH FOOD AS DIRECTED. 02/02/18   [name], MD  PHARMACIST CHOICE LANCETS MISC USE 2 TIMES DAILY FOR DIABETIC TESTING 12/08/14   [name], MD  sertraline (ZOLOFT) 50 MG tablet Take 1 tablet (50 mg total) by mouth daily. 11/07/17   [name], MD  Syringe/Needle, Disp, (SYRINGE 3CC/25GX1") 25G X 1" 3 ML MISC Use for b12 injections 06/10/17   [name], MD  Syringe/Needle, Disp, (SYRINGE LUER LOCK) 25G X 1" 3 ML MISC 1 applicator by Does not apply route every 30 (thirty) days. 11/21/16   [name], MD    Allergies Patient has no known allergies.  Family History  Problem Relation Age of Onset  . Cancer Mother 41       Breast  . Diabetes Mother   . Heart disease Father 27       Acute MI  . Heart disease Sister   . Arthritis Sister   . Heart disease Brother     Social History Social History   Tobacco Use  . Smoking status: Current Every Day Smoker    Packs/day: 0.50    Years: 5.00    Pack years: 2.50    Types: Cigarettes  . Smokeless tobacco: Never Used  Substance Use Topics  . Alcohol use: No  . Drug use: No    Review of Systems  Constitutional: No fever/chills Eyes: No visual changes. ENT: No sore throat. Cardiovascular: Denies chest pain. Respiratory: Denies shortness of breath. Gastrointestinal: No abdominal pain.  No nausea, no vomiting.  No diarrhea.  No constipation. Genitourinary: Negative for dysuria. Musculoskeletal: Negative for back pain. Skin: Negative for rash. Neurological: Negative for headaches, focal weakness  ____________________________________________   PHYSICAL EXAM:  VITAL SIGNS: ED Triage Vitals  Enc Vitals Group     BP 07/20/18 1740 (!) 163/78     Pulse Rate 07/20/18 1740 79     Resp 07/20/18 1740 18     Temp 07/20/18 1740 98.5 F (36.9 C)     Temp Source 07/20/18 1740 Oral     SpO2 07/20/18 1740 98 %      Weight 07/20/18 1741 175 lb (79.4 kg)     Height 07/20/18 1741 '5\' 6"'$  (1.676 m)     Head Circumference --      Peak Flow --      Pain Score 07/20/18 1740 7     Pain Loc --      Pain Edu? --      Excl. in [place]? --     Constitutional: Alert and oriented. Well appearing  and in no acute distress. Eyes: Conjunctivae are normal. PERRL. EOMI. Head: Atraumatic. Nose: No congestion/rhinnorhea. Mouth/Throat: Mucous membranes are moist.  Oropharynx non-erythematous. Neck: No stridor.   Cardiovascular: Normal rate, regular rhythm. Grossly normal heart sounds.  Good peripheral circulation. Respiratory: Normal respiratory effort.  No retractions. Lungs CTAB. Gastrointestinal: Soft and nontender. No distention. No abdominal bruits. No CVA tenderness. Musculoskeletal: No lower extremity tenderness nor edema.  No joint effusions. Neurologic:  Normal speech and language. No gross focal neurologic deficits are appreciated. No gait instability. Skin:  Skin is warm, dry and intact. No rash noted. Psychiatric: Mood and affect are normal. Speech and behavior are normal.  ____________________________________________   LABS (all labs ordered are listed, but only abnormal results are displayed)  Labs Reviewed - No data to display ____________________________________________  EKG   ____________________________________________  RADIOLOGY  ED MD interpretation:   Official radiology report(s): Dg Hip Unilat W Or Wo Pelvis 2-3 Views Left  Result Date: 07/20/2018 CLINICAL DATA:  85 year old female with history of left hip pain EXAM: DG HIP (WITH OR WITHOUT PELVIS) 2-3V LEFT COMPARISON:  None. FINDINGS: Osteopenia. Bony pelvic ring appears intact with no acute displaced fracture. Bilateral hips projects normally over the acetabula. Unremarkable appearance the proximal left femur. Degenerative changes of the lumbar spine and at the pubic symphysis. Joint space narrowing bilateral hips, compatible with  osteoarthritis. IMPRESSION: Negative for acute bony abnormality. Electronically Signed   By: Corrie Mckusick D.O.   On: 07/20/2018 18:25    ____________________________________________   PROCEDURES  Procedure(s) performed:   Procedures  Critical Care performed:   ____________________________________________   INITIAL IMPRESSION / ASSESSMENT AND PLAN / ED COURSE Pt siogned out to [facility] and Dr Mariea Clonts. Pt says can't walk due to pain in hip . Pt also has some sciatica. Will MRI hip and l spine . If all neg attempt to walk w/ walker. Plan for D?C if can walk and rehab/pain control if not.           ____________________________________________   FINAL CLINICAL IMPRESSION(S) / ED DIAGNOSES  Final diagnoses:  Left hip pain     ED Discharge Orders    None       Note:  This document was prepared using Dragon voice recognition software and may include unintentional dictation errors.    Nena Polio, MD 07/20/18 [phone]

## 2018-07-20 NOTE — Discharge Instructions (Signed)
Please call and schedule a follow up with your primary care doctor.  Use the walker for assistance until your pain has improved.  Return to the ER for symptoms that change or worsen if unable to schedule an appointment.

## 2018-07-20 NOTE — ED Notes (Signed)
Ambulated pt using walker. Pt able to take some steps with assistance. Pt back in bed resting at this time. No other needs voiced at this time.

## 2018-07-20 NOTE — ED Triage Notes (Signed)
Pt arrived via EMS from home with c/o left hip pain. Pt reports falling 4 days ago onto her left hip and was not seen by health care providers. Pt has not been able to ambulate since this fall.

## 2018-07-20 NOTE — ED Notes (Signed)
Pt medicated prior to MRI, MRI has been notified.

## 2018-07-20 NOTE — ED Notes (Signed)
Patient transported to MRI 

## 2018-07-21 ENCOUNTER — Telehealth: Payer: Self-pay | Admitting: *Deleted

## 2018-07-21 DIAGNOSIS — R0902 Hypoxemia: Secondary | ICD-10-CM | POA: Diagnosis not present

## 2018-07-21 DIAGNOSIS — S79912A Unspecified injury of left hip, initial encounter: Secondary | ICD-10-CM | POA: Diagnosis not present

## 2018-07-21 DIAGNOSIS — R52 Pain, unspecified: Secondary | ICD-10-CM | POA: Diagnosis not present

## 2018-07-21 DIAGNOSIS — M48062 Spinal stenosis, lumbar region with neurogenic claudication: Secondary | ICD-10-CM

## 2018-07-21 DIAGNOSIS — R279 Unspecified lack of coordination: Secondary | ICD-10-CM | POA: Diagnosis not present

## 2018-07-21 DIAGNOSIS — Z743 Need for continuous supervision: Secondary | ICD-10-CM | POA: Diagnosis not present

## 2018-07-21 DIAGNOSIS — R5381 Other malaise: Secondary | ICD-10-CM | POA: Diagnosis not present

## 2018-07-21 DIAGNOSIS — R001 Bradycardia, unspecified: Secondary | ICD-10-CM | POA: Diagnosis not present

## 2018-07-21 DIAGNOSIS — J189 Pneumonia, unspecified organism: Secondary | ICD-10-CM | POA: Diagnosis not present

## 2018-07-21 NOTE — Telephone Encounter (Signed)
Copied from [place] [phone]. Topic: General - Inquiry >> Jul 21, 2018  1:42 PM Vernona Rieger wrote: Reason for CRM: Patient is calling to let Dr Derrel Nip know she was in the hospital last night because her back was hurting so bad. She said she can not walk or stand so she called EMS. They took her to the hospital and had a MRI that was in the result of a ruptured disc. She said she can not make an appt because she says she can not walk or stand and if she had someone bring her she does not know how they would get her down the steps. She would like to speak with the nurse. Please Advise.

## 2018-07-21 NOTE — Telephone Encounter (Signed)
Patient says that she had to go by EMS to ER and return home by EMS, ER advised she needed an appointment this week with PCP scheduled fro 07/24/18 @ 1130. MR of Lumbar spine was completed pasted impression below.  MR of lumbar spine.  IMPRESSION: 1. No acute osseous abnormality. 2. Mild lumbar spine dextrocurvature with apex at L2-3. 3. Spondylosis of the lumbar spine greatest at the L1-2, L2-3, and L3-4 levels. 4. Moderate to severe L1-2, moderate L2-3, and severe L3-4 spinal canal stenosis. 5. Multilevel mild and moderate neural foraminal stenosis.

## 2018-07-21 NOTE — ED Notes (Signed)
EMS called and I spoke with Jarrett Soho.

## 2018-07-21 NOTE — ED Notes (Signed)
Pt and family verbalized understanding of d/c instructions, rx, and f/u care. No further questions at this time. Pt provided with walker and instructed on proper use. Awaits EMS for transport home at this time.

## 2018-07-21 NOTE — Telephone Encounter (Signed)
I have already initiated the neurosurgical referral

## 2018-07-24 ENCOUNTER — Ambulatory Visit: Payer: Medicare Other | Admitting: Internal Medicine

## 2018-07-25 ENCOUNTER — Other Ambulatory Visit: Payer: Self-pay | Admitting: Internal Medicine

## 2018-07-27 ENCOUNTER — Other Ambulatory Visit: Payer: Self-pay | Admitting: Internal Medicine

## 2018-08-11 ENCOUNTER — Ambulatory Visit: Payer: Medicare Other | Admitting: Family Medicine

## 2018-08-19 ENCOUNTER — Ambulatory Visit: Payer: Medicare Other | Admitting: Family Medicine

## 2018-08-20 ENCOUNTER — Other Ambulatory Visit: Payer: Self-pay | Admitting: Internal Medicine

## 2018-08-27 ENCOUNTER — Telehealth: Payer: Self-pay

## 2018-08-27 ENCOUNTER — Telehealth: Payer: Self-pay | Admitting: Internal Medicine

## 2018-08-27 DIAGNOSIS — G8929 Other chronic pain: Secondary | ICD-10-CM

## 2018-08-27 DIAGNOSIS — M545 Low back pain, unspecified: Secondary | ICD-10-CM

## 2018-08-27 DIAGNOSIS — R296 Repeated falls: Secondary | ICD-10-CM

## 2018-08-27 DIAGNOSIS — M25561 Pain in right knee: Principal | ICD-10-CM

## 2018-08-27 DIAGNOSIS — M25562 Pain in left knee: Principal | ICD-10-CM

## 2018-08-27 NOTE — Telephone Encounter (Signed)
Called patient she says she cannot come in and be evaluated due to she is home bound , and she is having trouble walking due to back pain with sciatica that she was seen in ED for she cannot get into office because she cannot walk down her steps alone.Patient reported daughter is in an out of house she cannot get her to come with her to doctor, patient ask wheelchair because she is afraid she will fall. Advised patient if she need to be seen call 911.

## 2018-08-27 NOTE — Telephone Encounter (Signed)
Copied from [place] [phone]. Topic: General - Other >> Aug 27, 2018  2:00 PM Stovall, Shana A wrote: Reason for CRM: pt called in and stated that she needs a script for a wheelchair.  She stated she does not have transportation. The Transportation that she did have told her that she would need a ramp built before they could pick her up or get her out of the house.  She stated she is having issues with her left leg.  She has been falling due to this left leg and that is why she needs a wheelchair .  She has no one to bring her to the office or that can get her out of the house.    Best number [phone]

## 2018-08-27 NOTE — Telephone Encounter (Signed)
Copied from [place] [phone]. Topic: General - Other >> Aug 27, 2018  2:00 PM Stovall, Shana A wrote: Reason for CRM: pt called in and stated that she needs a script for a wheelchair.  She stated she does not have transportation. The Transportation that she did have told her that she would need a ramp built before they could pick her up or get her out of the house.  She stated she is having issues with her left leg.  She has been falling due to this left leg and that is why she needs a wheelchair .  She has no one to bring her to the office or that can get her out of the house.    Best number [phone]

## 2018-08-28 NOTE — Telephone Encounter (Signed)
DME for wheelchair printed

## 2018-08-31 NOTE — Telephone Encounter (Signed)
See below

## 2018-09-01 NOTE — Telephone Encounter (Signed)
Spoke with pt and she stated that her brother got her a wheelchair and now she is just waiting on the ramp to be built so that she can get out of her house. She stated that as soon as the ramp is built she will call to schedule an appt.

## 2018-09-21 ENCOUNTER — Ambulatory Visit (INDEPENDENT_AMBULATORY_CARE_PROVIDER_SITE_OTHER): Payer: Medicare Other | Admitting: Internal Medicine

## 2018-09-21 DIAGNOSIS — M25562 Pain in left knee: Secondary | ICD-10-CM

## 2018-09-21 DIAGNOSIS — F339 Major depressive disorder, recurrent, unspecified: Secondary | ICD-10-CM

## 2018-09-21 DIAGNOSIS — G8929 Other chronic pain: Secondary | ICD-10-CM

## 2018-09-21 DIAGNOSIS — R296 Repeated falls: Secondary | ICD-10-CM

## 2018-09-21 DIAGNOSIS — M25561 Pain in right knee: Secondary | ICD-10-CM | POA: Diagnosis not present

## 2018-09-21 MED ORDER — SERTRALINE HCL 100 MG PO TABS: 100.0000 mg | ORAL_TABLET | Freq: Every day | ORAL | 1 refills | Status: DC

## 2018-09-21 MED ORDER — TRAMADOL HCL 50 MG PO TABS: 50.0000 mg | ORAL_TABLET | Freq: Two times a day (BID) | ORAL | 0 refills | Status: DC | PRN

## 2018-09-21 NOTE — Patient Instructions (Signed)
I am increasing your sertraline dose to 100 mg daily for your depression.  Take this after supper    Continue lamictal 50 mg at bedtime   You can take up to 2000 mg of acetominophen (tylenol) every day safely  In divided doses (500 mg every 6 hours  Or 1000 mg every 12 hours.)  You can add tramadol, up to 2 daily to your tylenol, for severe pain  I am ordering home physical therapy to help regain the strength in your legs

## 2018-09-21 NOTE — Progress Notes (Signed)
Virtual Visit via Telephone Note  I connected with Monica [name] on 09/22/18 at 10:30 AM EDT by telephone and verified that I am speaking with the correct person using two identifiers.   I discussed the limitations, risks, security and privacy concerns of performing an evaluation and management service by telephone and the availability of in person appointments. I also discussed with the patient that there may be a patient responsible charge related to this service. The patient expressed understanding and agreed to proceed.   History of Present Illness:   Cc: bilateral knee pain.  Saw orthopedist  , had steroid shots  And aspirated 60 ccs of fluid from right knee last May.   Not sleeping well.  Due to anxiety and depression. Taking 50 mg sertraline and 50 mg lamictal. Tearful on phone.   Had a fall at home  Jan 27 , had sudden pain in lower back while washing dishes at sink.  Sat down,  Pain got worse,  Started radiating to left ankle , then feel while walking down the hallway to bedroom  Says her leg gave way and she fell first against the wall, then to the ground. Taken to  ER. Had MRI hip and spine done,  Hip ok ,  Severe spinal stenosis noted at multiple levels, . Patient denies back pain,  Says her knees are hurting the most and has not been walking due to pain.   Taking tylenol 4 to 5 daily     ROS: See pertinent positives and negatives per HPI.  Past Medical History:  Diagnosis Date  . B12 deficiency   . COPD (chronic obstructive pulmonary disease) ([place])   . Diabetes mellitus   . H/O: Bell's palsy   . Hyperlipidemia   . Hypertension   . Lung mass April 2011   s/p biopsy, no cancer cells found, Repeat CT Dec 2012 unchanged    Past Surgical History:  Procedure Laterality Date  . ABDOMINAL HYSTERECTOMY    . CHOLECYSTECTOMY    . LUNG BIOPSY  2012   for positive PET, negative biopsy for malignancy  . SPINE SURGERY  2001   Lumbar    Family History  Problem Relation Age of  Onset  . Cancer Mother 25       Breast  . Diabetes Mother   . Heart disease Father 83       Acute MI  . Heart disease Sister   . Arthritis Sister   . Heart disease Brother     SOCIAL HX: married,  Lives with husband ,  granddaughter (adult)   Current Outpatient Medications:  .  ACCU-CHEK SMARTVIEW test strip, USE 2 TIMES DAILY FOR DIABETIC TESTING, Disp: 200 each, Rfl: 0 .  acetaminophen (TYLENOL) 500 MG tablet, Take 500 mg by mouth as needed.  , Disp: , Rfl:  .  atenolol (TENORMIN) 50 MG tablet, TAKE 1 TABLET BY MOUTH TWICE A DAY, Disp: 90 tablet, Rfl: 1 .  atorvastatin (LIPITOR) 80 MG tablet, TAKE ONE TABLET BY MOUTH EVERY DAY, Disp: 90 tablet, Rfl: 2 .  B-D INS SYR ULTRAFINE .3CC/30G 30G X 1/2" 0.3 ML MISC, USE AS DIRECTED, Disp: 100 each, Rfl: 2 .  B-D INS SYR ULTRAFINE .3CC/30G 30G X 1/2" 0.3 ML MISC, USE AS DIRECTED, Disp: 100 each, Rfl: 2 .  BD INSULIN SYRINGE U/F 30G X 1/2" 0.5 ML MISC, USE AS DIRECTED, Disp: 100 each, Rfl: 1 .  blood glucose meter kit and supplies, Dispense based on patient  and insurance preference. Use up to four times daily as directed. (FOR ICD-10 E10.9, E11.9)., Disp: 1 each, Rfl: 0 .  cyanocobalamin (,VITAMIN B-12,) 1000 MCG/ML injection, INJECT 1ML INTRAMUSCULAR ONCE WEEKLY FOR 3 WEEKS THEN ONCE A MONTH, Disp: 7 mL, Rfl: 2 .  diclofenac sodium (VOLTAREN) 1 % GEL, Apply 4 g topically 4 (four) times daily., Disp: 1 Tube, Rfl: 1 .  glipiZIDE (GLUCOTROL) 5 MG tablet, TAKE ONE TABLET BY MOUTH TWICE DAILY BEFORE MEALS, Disp: 180 tablet, Rfl: 1 .  Incontinence Supply Disposable (DISPOSABLE BRIEF LARGE) MISC, 1 application by Does not apply route 2 (two) times daily., Disp: 36 each, Rfl: 12 .  insulin NPH Human (HUMULIN N) 100 UNIT/ML injection, INJECT 20 UNITS AT BEDTIME AND 5 UNITS THE THE MORNING UNLESS BG IS LESS THAN 150, Disp: 10 mL, Rfl: 0 .  lamoTRIgine (LAMICTAL) 25 MG tablet, Take 2 tablets (50 mg total) by mouth at bedtime., Disp: 60 tablet, Rfl: 0 .   lisinopril (PRINIVIL,ZESTRIL) 40 MG tablet, TAKE 1 TABLET BY MOUTH DAILY, Disp: 90 tablet, Rfl: 1 .  metFORMIN (GLUCOPHAGE) 850 MG tablet, TAKE ONE TABLET BY MOUTH TWICE DAILY WITH FOOD AS DIRECTED., Disp: 180 tablet, Rfl: 0 .  PHARMACIST CHOICE LANCETS MISC, USE 2 TIMES DAILY FOR DIABETIC TESTING, Disp: 200 each, Rfl: 0 .  sertraline (ZOLOFT) 100 MG tablet, Take 1 tablet (100 mg total) by mouth daily., Disp: 90 tablet, Rfl: 1 .  Syringe/Needle, Disp, (SYRINGE 3CC/25GX1") 25G X 1" 3 ML MISC, Use for b12 injections, Disp: 50 each, Rfl: 0 .  Syringe/Needle, Disp, (SYRINGE LUER LOCK) 25G X 1" 3 ML MISC, 1 applicator by Does not apply route every 30 (thirty) days., Disp: 16 each, Rfl: 1 .  traMADol (ULTRAM) 50 MG tablet, Take 1 tablet (50 mg total) by mouth every 12 (twelve) hours as needed for severe pain., Disp: 30 tablet, Rfl: 0  Current Facility-Administered Medications:  .  cyanocobalamin ((VITAMIN B-12)) injection 1,000 mcg, 1,000 mcg, Intramuscular, Once, [name], MD  EXAM:  VITALS not obtained :  GENERAL: alert, oriented, sounds depressed  But in no acute distress  LUNGS: speaking in full sentences,   Not wheezing or short of breath    PSYCH/NEURO: pleasant and cooperative, seems depressed and anxious,  thought processing grossly intact. Denies suicidal ideation, denies domestic violence.   ASSESSMENT AND PLAN:  Discussed the following assessment and plan:  Bilateral chronic knee pain - Plan: Ambulatory referral to [place]  Major depressive disorder, recurrent episode with anxious distress ([place])  Recurrent falls     I discussed the assessment and treatment plan with the patient. The patient was provided an opportunity to ask questions and all were answered. The patient agreed with the plan and demonstrated an understanding of the instructions.   The patient was advised to call back or seek an in-person evaluation if the symptoms worsen or if the condition fails to  improve as anticipated.  I provided 15 minutes of non-face-to-face time during this encounter.   [name], MD

## 2018-09-22 NOTE — Assessment & Plan Note (Signed)
Recent ER visit reviewed .  PT Home health ordered as she has become wheelchair dependent due to proximal leg weakness

## 2018-09-22 NOTE — Assessment & Plan Note (Signed)
Advised to increase sertraline to 100 mg daily,  Continue lamictal 50 mg daily

## 2018-09-22 NOTE — Assessment & Plan Note (Signed)
Continue use of tylenol,  NSAID's contraindicated.  Advised to come in for bilateral steroid injections.

## 2018-09-25 ENCOUNTER — Telehealth: Payer: Self-pay | Admitting: Internal Medicine

## 2018-09-25 ENCOUNTER — Ambulatory Visit (INDEPENDENT_AMBULATORY_CARE_PROVIDER_SITE_OTHER): Payer: Medicare Other | Admitting: Internal Medicine

## 2018-09-25 ENCOUNTER — Encounter: Payer: Self-pay | Admitting: Internal Medicine

## 2018-09-25 ENCOUNTER — Other Ambulatory Visit: Payer: Self-pay

## 2018-09-25 VITALS — BP 100/66 | HR 71 | Resp 13

## 2018-09-25 DIAGNOSIS — F339 Major depressive disorder, recurrent, unspecified: Secondary | ICD-10-CM

## 2018-09-25 DIAGNOSIS — N183 Chronic kidney disease, stage 3 unspecified: Secondary | ICD-10-CM

## 2018-09-25 DIAGNOSIS — R5383 Other fatigue: Secondary | ICD-10-CM | POA: Diagnosis not present

## 2018-09-25 DIAGNOSIS — E538 Deficiency of other specified B group vitamins: Secondary | ICD-10-CM

## 2018-09-25 DIAGNOSIS — I129 Hypertensive chronic kidney disease with stage 1 through stage 4 chronic kidney disease, or unspecified chronic kidney disease: Secondary | ICD-10-CM

## 2018-09-25 DIAGNOSIS — Z7409 Other reduced mobility: Secondary | ICD-10-CM

## 2018-09-25 DIAGNOSIS — R262 Difficulty in walking, not elsewhere classified: Secondary | ICD-10-CM

## 2018-09-25 DIAGNOSIS — Z741 Need for assistance with personal care: Secondary | ICD-10-CM

## 2018-09-25 DIAGNOSIS — E1121 Type 2 diabetes mellitus with diabetic nephropathy: Secondary | ICD-10-CM

## 2018-09-25 DIAGNOSIS — E1122 Type 2 diabetes mellitus with diabetic chronic kidney disease: Secondary | ICD-10-CM

## 2018-09-25 DIAGNOSIS — Z794 Long term (current) use of insulin: Secondary | ICD-10-CM

## 2018-09-25 DIAGNOSIS — F05 Delirium due to known physiological condition: Secondary | ICD-10-CM | POA: Diagnosis not present

## 2018-09-25 MED ORDER — QUETIAPINE FUMARATE 25 MG PO TABS: 25.0000 mg | ORAL_TABLET | Freq: Every day | ORAL | 0 refills | Status: DC

## 2018-09-25 MED ORDER — CIPROFLOXACIN HCL 250 MG PO TABS: 250.0000 mg | ORAL_TABLET | Freq: Two times a day (BID) | ORAL | 0 refills | Status: DC

## 2018-09-25 MED ORDER — SERTRALINE HCL 50 MG PO TABS: 50.0000 mg | ORAL_TABLET | Freq: Every day | ORAL | 1 refills | Status: DC

## 2018-09-25 NOTE — Patient Instructions (Addendum)
Reduce the sertraline dose back to 50 mg daily (1/2 tablet using the 100 mg tablets)  Adding generic seroquel.  This can be given twice daily for agitation and hallucinations   I am treating for UTI  With cipro 250mg  twice daily for 5 days    Please give her a probiotic ( Align, Floraque or Culturelle) of the generic version of one of these, or give her yogurt daily   For a minimum of 3 weeks to prevent a serious antibiotic associated diarrhea  Called clostridium dificile colitis    Home health for PT and Nursing services as well as for possible placement

## 2018-09-25 NOTE — Progress Notes (Signed)
Subjective:  Patient ID: Monica [name], female    DOB: [birth year]  Age: 85 y.o. MRN: [id number]  CC: The primary encounter diagnosis was B12 deficiency. Diagnoses of Delirium due to another medical condition, acute, mixed level of activity, Lethargy, Type 2 DM with CKD stage 3 and hypertension ([employer]), Impaired mobility and personal care, Major depressive disorder, recurrent episode with anxious distress ([place]), Type 2 diabetes mellitus with diabetic nephropathy, with long-term current use of insulin ([place]), CKD stage 3 due to type 2 diabetes mellitus ([place]), Delirium due to another medical condition, acute, hyperactive, and Impaired ambulation were also pertinent to this visit.  HPI Monica [name] presents for 1) bilateral steroid injecti of both knees for management of severe OA/DJD  And 2)follow upon worsening depression  Per family patient has been hallucinating since last night,  And has not slept due to persistent agitation. She has had increased urinary frequency for the past week . She has chronic bladder incontinence for years,  But since her fall in January , she has become incontinent of bowel.  She states today she is not always aware of the urge to have a bowel movement and has spend several nights lying in a soiled diaper until her family member discovers the accident in the morning.   She has been unable to bear weight since her fall in late January and has been wheelchair bound.  She has moderate to severe spinal stenosis  And mild bilateral hip PA by MRIS done during ER evaluation  Involving the lumbar spine  From L1 to L4 with bilateral foraminal and central stenosis at all levels  by MRI  Last done during ER evaluation Jan 27 after falling at home.  No fractures were seen   Outpatient Medications Prior to Visit  Medication Sig Dispense Refill   ACCU-CHEK SMARTVIEW test strip USE 2 TIMES DAILY FOR DIABETIC TESTING 200 each 0   acetaminophen (TYLENOL) 500 MG tablet Take 500 mg by mouth as  needed.       atenolol (TENORMIN) 50 MG tablet TAKE 1 TABLET BY MOUTH TWICE A DAY 90 tablet 1   atorvastatin (LIPITOR) 80 MG tablet TAKE ONE TABLET BY MOUTH EVERY DAY 90 tablet 2   B-D INS SYR ULTRAFINE .3CC/30G 30G X 1/2" 0.3 ML MISC USE AS DIRECTED 100 each 2   B-D INS SYR ULTRAFINE .3CC/30G 30G X 1/2" 0.3 ML MISC USE AS DIRECTED 100 each 2   BD INSULIN SYRINGE U/F 30G X 1/2" 0.5 ML MISC USE AS DIRECTED 100 each 1   blood glucose meter kit and supplies Dispense based on patient and insurance preference. Use up to four times daily as directed. (FOR ICD-10 E10.9, E11.9). 1 each 0   cyanocobalamin (,VITAMIN B-12,) 1000 MCG/ML injection INJECT 1ML INTRAMUSCULAR ONCE WEEKLY FOR 3 WEEKS THEN ONCE A MONTH 7 mL 2   diclofenac sodium (VOLTAREN) 1 % GEL Apply 4 g topically 4 (four) times daily. 1 Tube 1   glipiZIDE (GLUCOTROL) 5 MG tablet TAKE ONE TABLET BY MOUTH TWICE DAILY BEFORE MEALS 180 tablet 1   Incontinence Supply Disposable (DISPOSABLE BRIEF LARGE) MISC 1 application by Does not apply route 2 (two) times daily. 36 each 12   insulin NPH Human (HUMULIN N) 100 UNIT/ML injection INJECT 20 UNITS AT BEDTIME AND 5 UNITS THE THE MORNING UNLESS BG IS LESS THAN 150 10 mL 0   lamoTRIgine (LAMICTAL) 25 MG tablet Take 2 tablets (50 mg total) by mouth at bedtime. [place]  tablet 0   lisinopril (PRINIVIL,ZESTRIL) 40 MG tablet TAKE 1 TABLET BY MOUTH DAILY 90 tablet 1   metFORMIN (GLUCOPHAGE) 850 MG tablet TAKE ONE TABLET BY MOUTH TWICE DAILY WITH FOOD AS DIRECTED. 180 tablet 0   PHARMACIST CHOICE LANCETS MISC USE 2 TIMES DAILY FOR DIABETIC TESTING 200 each 0   Syringe/Needle, Disp, (SYRINGE 3CC/25GX1") 25G X 1" 3 ML MISC Use for b12 injections 50 each 0   Syringe/Needle, Disp, (SYRINGE LUER LOCK) 25G X 1" 3 ML MISC 1 applicator by Does not apply route every 30 (thirty) days. 16 each 1   traMADol (ULTRAM) 50 MG tablet Take 1 tablet (50 mg total) by mouth every 12 (twelve) hours as needed for severe  pain. 30 tablet 0   sertraline (ZOLOFT) 100 MG tablet Take 1 tablet (100 mg total) by mouth daily. 90 tablet 1   Facility-Administered Medications Prior to Visit  Medication Dose Route Frequency Provider Last Rate Last Dose   cyanocobalamin ((VITAMIN B-12)) injection 1,000 mcg  1,000 mcg Intramuscular Once [name], MD        Review of Systems;  Patient denies headache, fevers, malaise, unintentional weight loss, skin rash, eye pain, sinus congestion and sinus pain, sore throat, dysphagia,  hemoptysis , cough, dyspnea, wheezing, chest pain, palpitations, orthopnea, edema, abdominal pain, nausea, melena, diarrhea, constipation, flank pain, dysuria, hematuria, , numbness, tingling, seizures,  Focal weakness, Loss of consciousness,  Tremor,  and suicidal ideation.      Objective:  BP 100/66 (BP Location: Left Arm, Patient Position: Sitting, Cuff Size: Normal)    Pulse 71    Resp 13    SpO2 97%   BP Readings from Last 3 Encounters:  09/25/18 100/66  07/20/18 (!) 141/61  11/07/17 (!) 144/86    Wt Readings from Last 3 Encounters:  07/20/18 175 lb (79.4 kg)  11/07/17 174 lb 1.9 oz (79 kg)  06/10/17 180 lb 3.2 oz (81.7 kg)    General appearance: alert, cooperative and appears stated age. Unable to provide history  Ears: normal TM's and external ear canals both ears Throat: lips, mucosa, and tongue normal; poor dentition  Neck: : no adenopathy, no carotid bruit, supple, symmetrical, trachea midline and thyroid not enlarged, symmetric, no tenderness/mass/nodules Back: symmetric, no curvature. ROM normal. No CVA tenderness. Lungs: clear to auscultation bilaterally Heart: regular rate and rhythm, S1, S2 normal, no murmur, click, rub or gallop Abdomen: soft, non-tender; bowel sounds normal; no masses,  no organomegaly Pulses: 2+ and symmetric MSK:  Knees are not warm or red,  OA changes evident  Skin: Skin color, texture, turgor normal. No rashes or lesions Lymph nodes: Cervical,  supraclavicular, and axillary nodes normal. Neuro:  awake and interactive with anxious  mood and affect. Higher cortical functions are normal. Speech is at baseline but vocabulary and answers are restricted . Extraocular movements are intact.  Sensation to light touch is grossly intact bilaterally of upper and lower extremities. Unable to cooperate with Motor examination .  She is wheelchair dependent and unable to stand without maximal assistance from 3 people.    Lab Results  Component Value Date   HGBA1C 6.5 (H) 09/25/2018   HGBA1C 7.0 (H) 11/07/2017   HGBA1C 7.9 06/10/2017    Lab Results  Component Value Date   CREATININE 1.78 (H) 09/25/2018   CREATININE 1.32 (H) 11/07/2017   CREATININE 1.39 (H) 02/13/2017    Lab Results  Component Value Date   WBC 16.4 (H) 09/25/2018   HGB 13.6  09/25/2018   HCT 40.7 09/25/2018   PLT 558 (H) 09/25/2018   GLUCOSE 81 09/25/2018   CHOL 235 (H) 02/13/2017   TRIG 228.0 (H) 02/13/2017   HDL 36.70 (L) 02/13/2017   LDLDIRECT 150.0 02/13/2017   LDLCALC 107 (H) 09/27/2015   ALT 16 09/25/2018   AST 17 09/25/2018   NA 139 09/25/2018   K 4.8 09/25/2018   CL 102 09/25/2018   CREATININE 1.78 (H) 09/25/2018   BUN 51 (H) 09/25/2018   CO2 20 09/25/2018   TSH 4.13 09/25/2018   HGBA1C 6.5 (H) 09/25/2018   MICROALBUR 5.2 (H) 11/07/2017    Mr Lumbar Spine Wo Contrast  Result Date: 07/20/2018 CLINICAL DATA:  85 y/o F; lower back pain radiating to the left hip and lower extremity. Fall 4 days ago. EXAM: MRI LUMBAR SPINE WITHOUT CONTRAST TECHNIQUE: Multiplanar, multisequence MR imaging of the lumbar spine was performed. No intravenous contrast was administered. COMPARISON:  None. FINDINGS: Segmentation:  Standard. Alignment: Mild lumbar dextrocurvature with apex at L2-3. Normal lumbar lordosis without listhesis. Vertebrae:  No fracture, evidence of discitis, or bone lesion. Conus medullaris and cauda equina: Conus extends to the L1-2 level. Conus and cauda  equina appear normal. Paraspinal and other soft tissues: Multiple T2 hyperintense structures within the left kidney measuring up to 22 mm arising from the lower pole, likely cysts. Disc levels: T12-L1: Bilateral facet hypertrophy. No significant disc displacement, foraminal stenosis, or canal stenosis. L1-2: Disc bulge, endplate marginal osteophytes, facet hypertrophy, and ligamentum flavum hypertrophy. Mild bilateral foraminal stenosis. Moderate to severe spinal canal stenosis. L2-3: Disc bulge, endplate marginal osteophytes eccentric to the left, facet hypertrophy, and ligamentum flavum hypertrophy. Moderate left foraminal stenosis. Moderate spinal canal stenosis. L3-4: Disc bulge, small left foraminal disc protrusion, as well as advanced facet and ligamentum flavum hypertrophy. Mild right and moderate left foraminal stenosis. Severe spinal canal stenosis. L4-5: Disc bulge with advanced bilateral facet hypertrophy and extraforaminal endplate marginal osteophytes. Moderate bilateral foraminal stenosis. Mild spinal canal stenosis. L5-S1: Disc bulge with endplate marginal osteophytes eccentric to the foraminal zones and bilateral facet hypertrophy. Mild bilateral foraminal stenosis. No significant spinal canal stenosis. IMPRESSION: 1. No acute osseous abnormality. 2. Mild lumbar spine dextrocurvature with apex at L2-3. 3. Spondylosis of the lumbar spine greatest at the L1-2, L2-3, and L3-4 levels. 4. Moderate to severe L1-2, moderate L2-3, and severe L3-4 spinal canal stenosis. 5. Multilevel mild and moderate neural foraminal stenosis. Electronically Signed   By: Kristine Garbe M.D.   On: 07/20/2018 22:04   Mr Hip Left Wo Contrast  Result Date: 07/20/2018 CLINICAL DATA:  Left hip pain since fall 4 days ago. Inability to bear weight. EXAM: MR OF THE LEFT HIP WITHOUT CONTRAST TECHNIQUE: Multiplanar, multisequence MR imaging was performed. No intravenous contrast was administered. COMPARISON:  Left hip  x-rays from same day. FINDINGS: Bones: There is no evidence of acute fracture, dislocation or avascular necrosis. No focal bone lesion. Mild degenerative changes of the pubic symphysis. The bilateral sacroiliac joints are unremarkable. Articular cartilage and labrum Articular cartilage: Mild partial-thickness cartilage loss of both hip joints. Small subchondral cyst in the right acetabulum. Labrum: Grossly intact, although evaluation is limited due to lack of intra-articular fluid. No paralabral abnormality. Joint or bursal effusion Joint effusion: No significant hip joint effusion. Bursae: No focal periarticular fluid collection. Muscles and tendons Muscles and tendons: The visualized gluteus, hamstring and iliopsoas tendons appear normal. No muscle edema or atrophy. Other findings Miscellaneous: Prior hysterectomy.  Colonic diverticulosis. IMPRESSION:  1.  No acute abnormality.  No fracture. 2. Mild bilateral hip osteoarthritis. Electronically Signed   By: Titus Dubin M.D.   On: 07/20/2018 22:03   Dg Hip Unilat W Or Wo Pelvis 2-3 Views Left  Result Date: 07/20/2018 CLINICAL DATA:  85 year old female with history of left hip pain EXAM: DG HIP (WITH OR WITHOUT PELVIS) 2-3V LEFT COMPARISON:  None. FINDINGS: Osteopenia. Bony pelvic ring appears intact with no acute displaced fracture. Bilateral hips projects normally over the acetabula. Unremarkable appearance the proximal left femur. Degenerative changes of the lumbar spine and at the pubic symphysis. Joint space narrowing bilateral hips, compatible with osteoarthritis. IMPRESSION: Negative for acute bony abnormality. Electronically Signed   By: Corrie Mckusick D.O.   On: 07/20/2018 18:25    Assessment & Plan:   Problem List Items Addressed This Visit    B12 deficiency - Primary   Major depressive disorder, recurrent episode with anxious distress ([place])    Symptoms of agitation and hallucinations coincided with increased sertraline dose which was done  during prior phone visit when patient was exhibiting increased depressive symptoms. .  Not sure if causative ,  But will lower dose back to 50 mg for now until delirium has resolved.       Relevant Medications   sertraline (ZOLOFT) 50 MG tablet   Type 2 diabetes mellitus with diabetic nephropathy, with long-term current use of insulin ([place])    Patient is overdue for diabetes follow up but control has improved, likely due to unintentional weight loss.  No changes to regimen today   Lab Results  Component Value Date   HGBA1C 6.5 (H) 09/25/2018   Lab Results  Component Value Date   MICROALBUR 5.2 (H) 11/07/2017   Lab Results  Component Value Date   CREATININE 1.78 (H) 09/25/2018         CKD stage 3 due to type 2 diabetes mellitus ([place])    gfr has dropped since last May.  Will recheck once acute illness has resolved.   Lab Results  Component Value Date   CREATININE 1.78 (H) 09/25/2018   Lab Results  Component Value Date   NA 139 09/25/2018   K 4.8 09/25/2018   CL 102 09/25/2018   CO2 20 09/25/2018         Delirium due to another medical condition, acute, hyperactive    With leukocytosis and history of fecal incontinence, urinary frequency suggesting UTI .  She is unable to provide urine for sample today .  Empiric Ciprofloxacin x 5 days .  Seroquel 25 mg bid prn agitation added today       Impaired ambulation    Presumed secondary to lumbar spinal stenosis and OA knees.  Home health PT  Ordered.  Her family is having difficulty caring for her at home due to her current status, Social work and skilled nursing added to assist with needs and possible placement.        Other Visit Diagnoses    Delirium due to another medical condition, acute, mixed level of activity       Relevant Orders   CBC with Differential/Platelet (Completed)   Ambulatory referral to [facility] [place]       Relevant Orders   TSH (Completed)   Ambulatory referral to Home Health   Type 2 DM  with CKD stage 3 and hypertension ([place])       Relevant Orders   Hemoglobin A1c (Completed)   Comprehensive metabolic panel (Completed)  Impaired mobility and personal care          I have changed Jehan E. Wallick's sertraline. I am also having her start on ciprofloxacin and QUEtiapine. Additionally, I am having her maintain her acetaminophen, Disposable Brief Large, Accu-Chek SmartView, Pharmacist Choice Lancets, B-D INS SYR ULTRAFINE .3CC/30G, Syringe Luer Lock, atorvastatin, B-D INS SYR ULTRAFINE .3CC/30G, SYRINGE 3CC/25GX1", blood glucose meter kit and supplies, lisinopril, BD Insulin Syringe U/F, glipiZIDE, atenolol, cyanocobalamin, lamoTRIgine, diclofenac sodium, metFORMIN, insulin NPH Human, and traMADol. We will continue to administer cyanocobalamin.  Meds ordered this encounter  Medications   sertraline (ZOLOFT) 50 MG tablet    Sig: Take 1 tablet (50 mg total) by mouth daily.    Dispense:  90 tablet    Refill:  1   ciprofloxacin (CIPRO) 250 MG tablet    Sig: Take 1 tablet (250 mg total) by mouth 2 (two) times daily.    Dispense:  10 tablet    Refill:  0   QUEtiapine (SEROQUEL) 25 MG tablet    Sig: Take 1 tablet (25 mg total) by mouth at bedtime.    Dispense:  90 tablet    Refill:  0    Medications Discontinued During This Encounter  Medication Reason   sertraline (ZOLOFT) 100 MG tablet    A total of 40 minutes was spent with patient more than half of which was spent in counseling patient on the above mentioned issues , reviewing and explaining recent labs and imaging studies done, and coordination of care.   Follow-up: No follow-ups on file.   [name], MD

## 2018-09-25 NOTE — Telephone Encounter (Signed)
Called and left vm for [facility].  Gave verbal orders for PT start date to be moved to 09/26/18 or 09/28/18 as requested.

## 2018-09-25 NOTE — Telephone Encounter (Signed)
Copied from [place] [id number]. Topic: Quick Communication - Home Health Verbal Orders >> Sep 25, 2018  8:40 AM Rayann Heman wrote: Caller/Agency: Lemuel/ Santina Evans Number: [phone] Requesting PT  Frequency: move the start care to 09/26/18 or 09/28/18. Depending on patient

## 2018-09-26 ENCOUNTER — Other Ambulatory Visit: Payer: Self-pay | Admitting: Internal Medicine

## 2018-09-26 LAB — HEMOGLOBIN A1C
Hgb A1c MFr Bld: 6.5 % of total Hgb — ABNORMAL HIGH (ref <5.7)
Mean Plasma Glucose: 140 (calc)
eAG (mmol/L): 7.7 (calc)

## 2018-09-26 LAB — CBC WITH DIFFERENTIAL/PLATELET
Absolute Monocytes: 1017 cells/uL — ABNORMAL HIGH (ref 200–950)
Basophils Absolute: 33 cells/uL (ref 0–200)
Basophils Relative: 0.2 %
Eosinophils Absolute: 33 cells/uL (ref 15–500)
Eosinophils Relative: 0.2 %
HCT: 40.7 % (ref 35.0–45.0)
Hemoglobin: 13.6 g/dL (ref 11.7–15.5)
Lymphs Abs: 1837 cells/uL (ref 850–3900)
MCH: 28.2 pg (ref 27.0–33.0)
MCHC: 33.4 g/dL (ref 32.0–36.0)
MCV: 84.4 fL (ref 80.0–100.0)
MPV: 10 fL (ref 7.5–12.5)
Monocytes Relative: 6.2 %
Neutro Abs: 13481 cells/uL — ABNORMAL HIGH (ref 1500–7800)
Neutrophils Relative %: 82.2 %
Platelets: 558 10*3/uL — ABNORMAL HIGH (ref 140–400)
RBC: 4.82 10*6/uL (ref 3.80–5.10)
RDW: 14.7 % (ref 11.0–15.0)
Total Lymphocyte: 11.2 %
WBC: 16.4 10*3/uL — ABNORMAL HIGH (ref 3.8–10.8)

## 2018-09-26 LAB — COMPREHENSIVE METABOLIC PANEL
AG Ratio: 0.9 (calc) — ABNORMAL LOW (ref 1.0–2.5)
ALT: 16 U/L (ref 6–29)
AST: 17 U/L (ref 10–35)
Albumin: 3.1 g/dL — ABNORMAL LOW (ref 3.6–5.1)
Alkaline phosphatase (APISO): 107 U/L (ref 37–153)
BUN/Creatinine Ratio: 29 (calc) — ABNORMAL HIGH (ref 6–22)
BUN: 51 mg/dL — ABNORMAL HIGH (ref 7–25)
CO2: 20 mmol/L (ref 20–32)
Calcium: 10.2 mg/dL (ref 8.6–10.4)
Chloride: 102 mmol/L (ref 98–110)
Creat: 1.78 mg/dL — ABNORMAL HIGH (ref 0.60–0.88)
Globulin: 3.5 g/dL (calc) (ref 1.9–3.7)
Glucose, Bld: 81 mg/dL (ref 65–99)
Potassium: 4.8 mmol/L (ref 3.5–5.3)
Sodium: 139 mmol/L (ref 135–146)
Total Bilirubin: 0.6 mg/dL (ref 0.2–1.2)
Total Protein: 6.6 g/dL (ref 6.1–8.1)

## 2018-09-26 LAB — TSH: TSH: 4.13 mIU/L (ref 0.40–4.50)

## 2018-09-27 DIAGNOSIS — E1122 Type 2 diabetes mellitus with diabetic chronic kidney disease: Secondary | ICD-10-CM | POA: Insufficient documentation

## 2018-09-27 DIAGNOSIS — N183 Chronic kidney disease, stage 3 unspecified: Secondary | ICD-10-CM | POA: Insufficient documentation

## 2018-09-27 DIAGNOSIS — Z794 Long term (current) use of insulin: Secondary | ICD-10-CM | POA: Insufficient documentation

## 2018-09-27 DIAGNOSIS — E1121 Type 2 diabetes mellitus with diabetic nephropathy: Secondary | ICD-10-CM | POA: Insufficient documentation

## 2018-09-27 DIAGNOSIS — R262 Difficulty in walking, not elsewhere classified: Secondary | ICD-10-CM | POA: Insufficient documentation

## 2018-09-27 DIAGNOSIS — F05 Delirium due to known physiological condition: Secondary | ICD-10-CM | POA: Insufficient documentation

## 2018-09-27 NOTE — Assessment & Plan Note (Signed)
gfr has dropped since last May.  Will recheck once acute illness has resolved.   Lab Results  Component Value Date   CREATININE 1.78 (H) 09/25/2018   Lab Results  Component Value Date   NA 139 09/25/2018   K 4.8 09/25/2018   CL 102 09/25/2018   CO2 20 09/25/2018

## 2018-09-27 NOTE — Assessment & Plan Note (Signed)
With leukocytosis and history of fecal incontinence, urinary frequency suggesting UTI .  She is unable to provide urine for sample today .  Empiric Ciprofloxacin x 5 days .  Seroquel 25 mg bid prn agitation added today

## 2018-09-27 NOTE — Assessment & Plan Note (Signed)
Symptoms of agitation and hallucinations coincided with increased sertraline dose which was done during prior phone visit when patient was exhibiting increased depressive symptoms. .  Not sure if causative ,  But will lower dose back to 50 mg for now until delirium has resolved.

## 2018-09-27 NOTE — Assessment & Plan Note (Addendum)
Patient is overdue for diabetes follow up but control has improved, likely due to unintentional weight loss.  No changes to regimen today   Lab Results  Component Value Date   HGBA1C 6.5 (H) 09/25/2018   Lab Results  Component Value Date   MICROALBUR 5.2 (H) 11/07/2017   Lab Results  Component Value Date   CREATININE 1.78 (H) 09/25/2018

## 2018-09-27 NOTE — Assessment & Plan Note (Signed)
Presumed secondary to lumbar spinal stenosis and OA knees.  Home health PT  Ordered.  Her family is having difficulty caring for her at home due to her current status, Social work and skilled nursing added to assist with needs and possible placement.

## 2018-09-29 ENCOUNTER — Telehealth: Payer: Self-pay | Admitting: Internal Medicine

## 2018-09-29 MED ORDER — BLOOD GLUCOSE METER KIT: PACK | 0 refills | Status: DC

## 2018-09-29 NOTE — Telephone Encounter (Signed)
Rx for glucometer has been sent to pharmacy.

## 2018-09-29 NOTE — Telephone Encounter (Signed)
Tell her family to stop the glipzide and reduce her NPH insulin to 10 units

## 2018-09-29 NOTE — Telephone Encounter (Signed)
Copied from [place] [id number]. Topic: Quick Communication - See Telephone Encounter >> Sep 29, 2018 10:07 AM Valla Leaver wrote: CRM for notification. See Telephone encounter for: 09/29/18. Grandaughter Reagan, calling because patient needs a new sugar meter because it broke and her sugar dropped to 30 last night and they thought she was dying. Called EMS and they gave her "sugar water" through IV and she went back to her normal self. Please call back to discuss.

## 2018-09-29 NOTE — Telephone Encounter (Signed)
Spoke with pt's granddaughter and informed her of the medication changes. Granddaughter gave a verbal understanding.

## 2018-10-05 ENCOUNTER — Ambulatory Visit (INDEPENDENT_AMBULATORY_CARE_PROVIDER_SITE_OTHER): Payer: Medicare Other | Admitting: Internal Medicine

## 2018-10-05 DIAGNOSIS — G8929 Other chronic pain: Secondary | ICD-10-CM

## 2018-10-05 DIAGNOSIS — M25561 Pain in right knee: Secondary | ICD-10-CM

## 2018-10-05 DIAGNOSIS — R262 Difficulty in walking, not elsewhere classified: Secondary | ICD-10-CM

## 2018-10-05 DIAGNOSIS — F339 Major depressive disorder, recurrent, unspecified: Secondary | ICD-10-CM

## 2018-10-05 DIAGNOSIS — M48061 Spinal stenosis, lumbar region without neurogenic claudication: Secondary | ICD-10-CM | POA: Diagnosis not present

## 2018-10-05 DIAGNOSIS — R159 Full incontinence of feces: Secondary | ICD-10-CM

## 2018-10-05 DIAGNOSIS — M25562 Pain in left knee: Secondary | ICD-10-CM

## 2018-10-05 NOTE — Progress Notes (Signed)
Virtual Visit via [email or web address] app  This visit type was conducted due to national recommendations for restrictions regarding the COVID-19 pandemic (e.g. social distancing).  This format is felt to be most appropriate for this patient at this time.  All issues noted in this document were discussed and addressed.  No physical exam was performed (except for noted visual exam findings with Video Visits).   I connected with@ on 10/06/18 at  3:30 PM EDT by a video enabled telemedicine application or telephone and verified that I am speaking with the correct person using two identifiers. Location patient: home Location provider: work or home office Persons participating in the virtual visit: patient, provider and patient's granddaughter   I discussed the limitations, risks, security and privacy concerns of performing an evaluation and management service by telephone and the availability of in person appointments. I also discussed with the patient that there may be a patient responsible charge related to this service. The patient expressed understanding and agreed to proceed.  Reason for visit: follow up on multiple acute and subacute issues raised last week  HPI:  85 yr old white female with history of bilateral DJD knees,  And lumbar spinal stenosis presented last week for steroid knee injections and medication management of depression,  but treated instead for  Presumed UTI causing delirium when patient's  granddaughter reported that patient was having visual hallucinations for several days.  She reports today that the hallucinations: improved but have not resolved with 1) empiric treatment of UTI 2) lowering of sertraline dose and 3) addition of seroquel at night. Patient continues to report seeing her infant great grandson in bed with her and 2)  Believes she is standing/sitting up when in fact she has remained in bed for the past week. Family has been propping her up to eat but have been unable to persuade or  assist her in transferring to a wheel chair.   Knee pain:she continues to report that she cannot walk due to bilateral knee pain  She  Has avoided  sitting up in bed or transferring to a wheelchair "because my back feels weak"  Her bladder and bowel incontinence have not improved   Type 2 dm with recurrent hypoglycemia reported since last visit,  Morning insulin and glipizide have been stopped and evening dose of NPH reduced to 10 units.  No subsequent  episodes of hypoglycemia . BS now ranging from 125 to 144   Acute on chronic CKD:  Noted on labs done last week. Family has been pushing fluids,  And increasing her water intake but she continues to have a poor appetite   ROS: See pertinent positives and negatives per HPI.  Past Medical History:  Diagnosis Date  . B12 deficiency   . COPD (chronic obstructive pulmonary disease) ([place])   . Diabetes mellitus   . H/O: Bell's palsy   . Hyperlipidemia   . Hypertension   . Lung mass April 2011   s/p biopsy, no cancer cells found, Repeat CT Dec 2012 unchanged    Past Surgical History:  Procedure Laterality Date  . ABDOMINAL HYSTERECTOMY    . CHOLECYSTECTOMY    . LUNG BIOPSY  2012   for positive PET, negative biopsy for malignancy  . SPINE SURGERY  2001   Lumbar    Family History  Problem Relation Age of Onset  . Cancer Mother 86       Breast  . Diabetes Mother   . Heart disease Father 50  Acute MI  . Heart disease Sister   . Arthritis Sister   . Heart disease Brother     SOCIAL HX: married, lives at home with husband  Granddaughter and great grandson   Current Outpatient Medications:  .  ACCU-CHEK SMARTVIEW test strip, USE 2 TIMES DAILY FOR DIABETIC TESTING, Disp: 200 each, Rfl: 0 .  acetaminophen (TYLENOL) 500 MG tablet, Take 500 mg by mouth as needed.  , Disp: , Rfl:  .  atenolol (TENORMIN) 50 MG tablet, TAKE 1 TABLET BY MOUTH TWICE A DAY, Disp: 90 tablet, Rfl: 1 .  atorvastatin (LIPITOR) 80 MG tablet, TAKE ONE TABLET  BY MOUTH EVERY DAY, Disp: 90 tablet, Rfl: 2 .  B-D INS SYR ULTRAFINE .3CC/30G 30G X 1/2" 0.3 ML MISC, USE AS DIRECTED, Disp: 100 each, Rfl: 2 .  B-D INS SYR ULTRAFINE .3CC/30G 30G X 1/2" 0.3 ML MISC, USE AS DIRECTED, Disp: 100 each, Rfl: 2 .  BD INSULIN SYRINGE U/F 30G X 1/2" 0.5 ML MISC, USE AS DIRECTED, Disp: 100 each, Rfl: 1 .  blood glucose meter kit and supplies, Use to check blood sugars up to two times daily. Dispense based on patient and insurance preference. (FOR ICD-10 E10.9, E11.9)., Disp: 1 each, Rfl: 0 .  cyanocobalamin (,VITAMIN B-12,) 1000 MCG/ML injection, INJECT 1ML INTRAMUSCULAR ONCE WEEKLY FOR 3 WEEKS THEN ONCE A MONTH, Disp: 7 mL, Rfl: 2 .  diclofenac sodium (VOLTAREN) 1 % GEL, Apply 4 g topically 4 (four) times daily., Disp: 1 Tube, Rfl: 1 .  Incontinence Supply Disposable (DISPOSABLE BRIEF LARGE) MISC, 1 application by Does not apply route 2 (two) times daily., Disp: 36 each, Rfl: 12 .  insulin NPH Human (HUMULIN N) 100 UNIT/ML injection, INJECT 20 UNITS AT BEDTIME AND 5 UNITS THE THE MORNING UNLESS BG IS LESS THAN 150 (Patient taking differently: INJECT 10 UNITS AT BEDTIME AND 5 UNITS THE THE MORNING UNLESS BG IS LESS THAN 150), Disp: 10 mL, Rfl: 0 .  lamoTRIgine (LAMICTAL) 25 MG tablet, TAKE 2 TABLETS BY MOUTH AT BEDTIME (NEEDS  OFFICE  VISIT  PRIOR  TO  ANYMORE  REFILLS), Disp: 60 tablet, Rfl: 0 .  lisinopril (PRINIVIL,ZESTRIL) 40 MG tablet, TAKE 1 TABLET BY MOUTH DAILY, Disp: 90 tablet, Rfl: 1 .  metFORMIN (GLUCOPHAGE) 850 MG tablet, TAKE ONE TABLET BY MOUTH TWICE DAILY WITH FOOD AS DIRECTED., Disp: 180 tablet, Rfl: 0 .  PHARMACIST CHOICE LANCETS MISC, USE 2 TIMES DAILY FOR DIABETIC TESTING, Disp: 200 each, Rfl: 0 .  QUEtiapine (SEROQUEL) 25 MG tablet, Take 1 tablet (25 mg total) by mouth at bedtime., Disp: 90 tablet, Rfl: 0 .  sertraline (ZOLOFT) 50 MG tablet, Take 1 tablet (50 mg total) by mouth daily., Disp: 90 tablet, Rfl: 1 .  Syringe/Needle, Disp, (SYRINGE 3CC/25GX1")  25G X 1" 3 ML MISC, Use for b12 injections, Disp: 50 each, Rfl: 0 .  Syringe/Needle, Disp, (SYRINGE LUER LOCK) 25G X 1" 3 ML MISC, 1 applicator by Does not apply route every 30 (thirty) days., Disp: 16 each, Rfl: 1 .  traMADol (ULTRAM) 50 MG tablet, Take 1 tablet (50 mg total) by mouth every 12 (twelve) hours as needed for severe pain., Disp: 30 tablet, Rfl: 0  Current Facility-Administered Medications:  .  cyanocobalamin ((VITAMIN B-12)) injection 1,000 mcg, 1,000 mcg, Intramuscular, Once, Derrel Nip, Aris Everts, MD  EXAMTonette Bihari per patient if applicable:  GENERAL: alert, oriented to day year and person but not month .appears calm, and  in no acute  distress. Patient lying supine in bed  HEENT: atraumatic, conjunttiva clear, no obvious abnormalities on inspection of external nose and ears  NECK: normal movements of the head and neck  LUNGS: on inspection no signs of respiratory distress, breathing rate appears normal, no obvious gross SOB, gasping or wheezing  CV: no obvious cyanosis  MS: moves both arms s without noticeable abnormality  PSYCH/NEURO: pleasant and cooperative, no obvious depression or anxiety, speech and thought processing grossly intact  ASSESSMENT AND PLAN:  Discussed the following assessment and plan:  Major depressive disorder, recurrent episode with anxious distress She is not anxious today and is not tearful.  Hallucinations have improved but not resolved and are fixed.  adding morning dose of seroquel   Bilateral chronic knee pain Secondary to severe DJD.  Scheduling  tramadol and tylenol  For twice daily use ,  Home PT ordered but delayed due to [place] 19 epidemic  Spinal stenosis of lumbar region Noted on lumbar MRI done in January by ER physician during post fall evaluation. Her back pain may be contributing significantly to her immobility.  Will recommend a neurosurgical referral for consideration of available treatment modalities   Fecal  incontinence Unclear how much of her fecal incontinence is due to impaired sensation vs impaired mobility because she states that "sometimes" she is not aware of the urge to defecate.   Impaired ambulation Given her current deterioration, she may require placement if family cannot provide adequate care.  Home health referral for  PT, RN and social work  Made last week; they have not received any contact yet.    Updated Medication List Outpatient Encounter Medications as of 10/05/2018  Medication Sig  . ACCU-CHEK SMARTVIEW test strip USE 2 TIMES DAILY FOR DIABETIC TESTING  . acetaminophen (TYLENOL) 500 MG tablet Take 500 mg by mouth as needed.    Marland Kitchen atenolol (TENORMIN) 50 MG tablet TAKE 1 TABLET BY MOUTH TWICE A DAY  . atorvastatin (LIPITOR) 80 MG tablet TAKE ONE TABLET BY MOUTH EVERY DAY  . B-D INS SYR ULTRAFINE .3CC/30G 30G X 1/2" 0.3 ML MISC USE AS DIRECTED  . B-D INS SYR ULTRAFINE .3CC/30G 30G X 1/2" 0.3 ML MISC USE AS DIRECTED  . BD INSULIN SYRINGE U/F 30G X 1/2" 0.5 ML MISC USE AS DIRECTED  . blood glucose meter kit and supplies Use to check blood sugars up to two times daily. Dispense based on patient and insurance preference. (FOR ICD-10 E10.9, E11.9).  . cyanocobalamin (,VITAMIN B-12,) 1000 MCG/ML injection INJECT 1ML INTRAMUSCULAR ONCE WEEKLY FOR 3 WEEKS THEN ONCE A MONTH  . diclofenac sodium (VOLTAREN) 1 % GEL Apply 4 g topically 4 (four) times daily.  . Incontinence Supply Disposable (DISPOSABLE BRIEF LARGE) MISC 1 application by Does not apply route 2 (two) times daily.  . insulin NPH Human (HUMULIN N) 100 UNIT/ML injection INJECT 20 UNITS AT BEDTIME AND 5 UNITS THE THE MORNING UNLESS BG IS LESS THAN 150 (Patient taking differently: INJECT 10 UNITS AT BEDTIME AND 5 UNITS THE THE MORNING UNLESS BG IS LESS THAN 150)  . lamoTRIgine (LAMICTAL) 25 MG tablet TAKE 2 TABLETS BY MOUTH AT BEDTIME (NEEDS  OFFICE  VISIT  PRIOR  TO  ANYMORE  REFILLS)  . lisinopril (PRINIVIL,ZESTRIL) 40 MG tablet  TAKE 1 TABLET BY MOUTH DAILY  . metFORMIN (GLUCOPHAGE) 850 MG tablet TAKE ONE TABLET BY MOUTH TWICE DAILY WITH FOOD AS DIRECTED.  Marland Kitchen PHARMACIST CHOICE LANCETS MISC USE 2 TIMES DAILY FOR DIABETIC TESTING  .  QUEtiapine (SEROQUEL) 25 MG tablet Take 1 tablet (25 mg total) by mouth at bedtime.  . sertraline (ZOLOFT) 50 MG tablet Take 1 tablet (50 mg total) by mouth daily.  . Syringe/Needle, Disp, (SYRINGE 3CC/25GX1") 25G X 1" 3 ML MISC Use for b12 injections  . Syringe/Needle, Disp, (SYRINGE LUER LOCK) 25G X 1" 3 ML MISC 1 applicator by Does not apply route every 30 (thirty) days.  . traMADol (ULTRAM) 50 MG tablet Take 1 tablet (50 mg total) by mouth every 12 (twelve) hours as needed for severe pain.  . [DISCONTINUED] ciprofloxacin (CIPRO) 250 MG tablet Take 1 tablet (250 mg total) by mouth 2 (two) times daily. (Patient not taking: Reported on 10/05/2018)  . [DISCONTINUED] glipiZIDE (GLUCOTROL) 5 MG tablet TAKE ONE TABLET BY MOUTH TWICE DAILY BEFORE MEALS (Patient not taking: Reported on 10/05/2018)   Facility-Administered Encounter Medications as of 10/05/2018  Medication  . cyanocobalamin ((VITAMIN B-12)) injection 1,000 mcg     I discussed the assessment and treatment plan with the patient. The patient was provided an opportunity to ask questions and all were answered. The patient agreed with the plan and demonstrated an understanding of the instructions.   The patient was advised to call back or seek an in-person evaluation if the symptoms worsen or if the condition fails to improve as anticipated.  I provided 40 minutes of non-face-to-face time during this encounter.   [name], MD

## 2018-10-05 NOTE — Patient Instructions (Signed)
Add a mornng dose of Seroquel for the hallucinations    schedule tramadol every 12 hours to manage back pain and knee pain    You can add up to 2000 mg of acetominophen (tylenol) every day safely  In divided doses (500 mg every 6 hours  Or 1000 mg every 12 hours.)  Continue 10 units on insulin at night only   Try adding ensure to ice cream to increase her caloric intake

## 2018-10-06 DIAGNOSIS — M48061 Spinal stenosis, lumbar region without neurogenic claudication: Secondary | ICD-10-CM | POA: Insufficient documentation

## 2018-10-06 DIAGNOSIS — R159 Full incontinence of feces: Secondary | ICD-10-CM | POA: Insufficient documentation

## 2018-10-06 MED ORDER — QUETIAPINE FUMARATE 25 MG PO TABS: 25.0000 mg | ORAL_TABLET | Freq: Two times a day (BID) | ORAL | 0 refills | Status: DC

## 2018-10-06 NOTE — Assessment & Plan Note (Addendum)
Unclear how much of her fecal incontinence is due to impaired sensation vs impaired mobility because she states that "sometimes" she is not aware of the urge to defecate.

## 2018-10-06 NOTE — Telephone Encounter (Signed)
Please let patient/granddaughter now that after considering her  Back pain and inability to work I am recommending a referral to a neurosurgeon  For evaluation,  To make sure that the spinal stenosis is not causing the inability to walk.

## 2018-10-06 NOTE — Assessment & Plan Note (Signed)
Secondary to severe DJD.  Scheduling  tramadol and tylenol  For twice daily use ,  Home PT ordered but delayed due to COVID 19 epidemic

## 2018-10-06 NOTE — Assessment & Plan Note (Addendum)
Noted on lumbar MRI done in January by ER physician during post fall evaluation. Her back pain may be contributing significantly to her immobility.  Will recommend a neurosurgical referral for consideration of available treatment modalities

## 2018-10-06 NOTE — Assessment & Plan Note (Signed)
She is not anxious today and is not tearful.  Hallucinations have improved but not resolved and are fixed.  adding morning dose of seroquel

## 2018-10-06 NOTE — Telephone Encounter (Signed)
Spoke with pt's granddaughter and she stated that the referral to a neurosurgeon is fine.

## 2018-10-06 NOTE — Assessment & Plan Note (Signed)
Given her current deterioration, she may require placement if family cannot provide adequate care.  Home health referral for  PT, RN and social work  Made last week; they have not received any contact yet.

## 2018-10-12 ENCOUNTER — Emergency Department: Payer: Medicare Other

## 2018-10-12 ENCOUNTER — Telehealth: Payer: Self-pay | Admitting: Internal Medicine

## 2018-10-12 ENCOUNTER — Inpatient Hospital Stay
Admission: EM | Admit: 2018-10-12 | Discharge: 2018-10-20 | DRG: 981 | Disposition: A | Discharge status: Skilled Nursing Facility | Payer: Medicare Other | Attending: Internal Medicine | Admitting: Internal Medicine

## 2018-10-12 ENCOUNTER — Other Ambulatory Visit: Payer: Self-pay

## 2018-10-12 DIAGNOSIS — I4891 Unspecified atrial fibrillation: Secondary | ICD-10-CM | POA: Diagnosis not present

## 2018-10-12 DIAGNOSIS — E1151 Type 2 diabetes mellitus with diabetic peripheral angiopathy without gangrene: Secondary | ICD-10-CM | POA: Diagnosis present

## 2018-10-12 DIAGNOSIS — F0390 Unspecified dementia without behavioral disturbance: Secondary | ICD-10-CM | POA: Diagnosis present

## 2018-10-12 DIAGNOSIS — R531 Weakness: Secondary | ICD-10-CM | POA: Diagnosis not present

## 2018-10-12 DIAGNOSIS — M4800 Spinal stenosis, site unspecified: Secondary | ICD-10-CM | POA: Diagnosis not present

## 2018-10-12 DIAGNOSIS — Z8249 Family history of ischemic heart disease and other diseases of the circulatory system: Secondary | ICD-10-CM | POA: Diagnosis not present

## 2018-10-12 DIAGNOSIS — I96 Gangrene, not elsewhere classified: Secondary | ICD-10-CM | POA: Diagnosis not present

## 2018-10-12 DIAGNOSIS — L8990 Pressure ulcer of unspecified site, unspecified stage: Secondary | ICD-10-CM

## 2018-10-12 DIAGNOSIS — M2550 Pain in unspecified joint: Secondary | ICD-10-CM | POA: Diagnosis not present

## 2018-10-12 DIAGNOSIS — Z794 Long term (current) use of insulin: Secondary | ICD-10-CM

## 2018-10-12 DIAGNOSIS — E860 Dehydration: Secondary | ICD-10-CM | POA: Diagnosis present

## 2018-10-12 DIAGNOSIS — Z833 Family history of diabetes mellitus: Secondary | ICD-10-CM

## 2018-10-12 DIAGNOSIS — N390 Urinary tract infection, site not specified: Secondary | ICD-10-CM | POA: Diagnosis not present

## 2018-10-12 DIAGNOSIS — F339 Major depressive disorder, recurrent, unspecified: Secondary | ICD-10-CM | POA: Diagnosis present

## 2018-10-12 DIAGNOSIS — L89154 Pressure ulcer of sacral region, stage 4: Secondary | ICD-10-CM

## 2018-10-12 DIAGNOSIS — M4628 Osteomyelitis of vertebra, sacral and sacrococcygeal region: Secondary | ICD-10-CM | POA: Diagnosis not present

## 2018-10-12 DIAGNOSIS — R262 Difficulty in walking, not elsewhere classified: Secondary | ICD-10-CM | POA: Diagnosis not present

## 2018-10-12 DIAGNOSIS — I129 Hypertensive chronic kidney disease with stage 1 through stage 4 chronic kidney disease, or unspecified chronic kidney disease: Secondary | ICD-10-CM | POA: Diagnosis not present

## 2018-10-12 DIAGNOSIS — M6281 Muscle weakness (generalized): Secondary | ICD-10-CM | POA: Diagnosis not present

## 2018-10-12 DIAGNOSIS — Z7401 Bed confinement status: Secondary | ICD-10-CM

## 2018-10-12 DIAGNOSIS — R627 Adult failure to thrive: Secondary | ICD-10-CM | POA: Diagnosis present

## 2018-10-12 DIAGNOSIS — E1140 Type 2 diabetes mellitus with diabetic neuropathy, unspecified: Secondary | ICD-10-CM | POA: Diagnosis not present

## 2018-10-12 DIAGNOSIS — F419 Anxiety disorder, unspecified: Secondary | ICD-10-CM | POA: Diagnosis present

## 2018-10-12 DIAGNOSIS — E1122 Type 2 diabetes mellitus with diabetic chronic kidney disease: Secondary | ICD-10-CM | POA: Diagnosis present

## 2018-10-12 DIAGNOSIS — L89512 Pressure ulcer of right ankle, stage 2: Secondary | ICD-10-CM | POA: Diagnosis present

## 2018-10-12 DIAGNOSIS — L89159 Pressure ulcer of sacral region, unspecified stage: Secondary | ICD-10-CM | POA: Diagnosis not present

## 2018-10-12 DIAGNOSIS — E1169 Type 2 diabetes mellitus with other specified complication: Secondary | ICD-10-CM | POA: Diagnosis present

## 2018-10-12 DIAGNOSIS — L89619 Pressure ulcer of right heel, unspecified stage: Secondary | ICD-10-CM | POA: Diagnosis not present

## 2018-10-12 DIAGNOSIS — I6782 Cerebral ischemia: Secondary | ICD-10-CM | POA: Diagnosis not present

## 2018-10-12 DIAGNOSIS — W19XXXA Unspecified fall, initial encounter: Secondary | ICD-10-CM | POA: Diagnosis not present

## 2018-10-12 DIAGNOSIS — N183 Chronic kidney disease, stage 3 (moderate): Secondary | ICD-10-CM | POA: Diagnosis not present

## 2018-10-12 DIAGNOSIS — Z79899 Other long term (current) drug therapy: Secondary | ICD-10-CM

## 2018-10-12 DIAGNOSIS — E118 Type 2 diabetes mellitus with unspecified complications: Secondary | ICD-10-CM | POA: Diagnosis not present

## 2018-10-12 DIAGNOSIS — L89153 Pressure ulcer of sacral region, stage 3: Secondary | ICD-10-CM | POA: Diagnosis not present

## 2018-10-12 DIAGNOSIS — N184 Chronic kidney disease, stage 4 (severe): Secondary | ICD-10-CM | POA: Diagnosis not present

## 2018-10-12 DIAGNOSIS — E7849 Other hyperlipidemia: Secondary | ICD-10-CM | POA: Diagnosis not present

## 2018-10-12 DIAGNOSIS — E785 Hyperlipidemia, unspecified: Secondary | ICD-10-CM | POA: Diagnosis not present

## 2018-10-12 DIAGNOSIS — J449 Chronic obstructive pulmonary disease, unspecified: Secondary | ICD-10-CM | POA: Diagnosis not present

## 2018-10-12 DIAGNOSIS — L98423 Non-pressure chronic ulcer of back with necrosis of muscle: Secondary | ICD-10-CM | POA: Diagnosis not present

## 2018-10-12 DIAGNOSIS — Z803 Family history of malignant neoplasm of breast: Secondary | ICD-10-CM | POA: Diagnosis not present

## 2018-10-12 DIAGNOSIS — M25661 Stiffness of right knee, not elsewhere classified: Secondary | ICD-10-CM | POA: Diagnosis not present

## 2018-10-12 DIAGNOSIS — Z1159 Encounter for screening for other viral diseases: Secondary | ICD-10-CM | POA: Diagnosis not present

## 2018-10-12 DIAGNOSIS — L89521 Pressure ulcer of left ankle, stage 1: Secondary | ICD-10-CM | POA: Diagnosis present

## 2018-10-12 DIAGNOSIS — Z515 Encounter for palliative care: Secondary | ICD-10-CM | POA: Diagnosis not present

## 2018-10-12 DIAGNOSIS — R404 Transient alteration of awareness: Secondary | ICD-10-CM | POA: Diagnosis not present

## 2018-10-12 DIAGNOSIS — Z6826 Body mass index (BMI) 26.0-26.9, adult: Secondary | ICD-10-CM | POA: Diagnosis not present

## 2018-10-12 DIAGNOSIS — E119 Type 2 diabetes mellitus without complications: Secondary | ICD-10-CM | POA: Diagnosis not present

## 2018-10-12 DIAGNOSIS — Z87891 Personal history of nicotine dependence: Secondary | ICD-10-CM | POA: Diagnosis not present

## 2018-10-12 DIAGNOSIS — E876 Hypokalemia: Secondary | ICD-10-CM | POA: Diagnosis not present

## 2018-10-12 DIAGNOSIS — M170 Bilateral primary osteoarthritis of knee: Secondary | ICD-10-CM | POA: Diagnosis present

## 2018-10-12 DIAGNOSIS — Z8261 Family history of arthritis: Secondary | ICD-10-CM

## 2018-10-12 DIAGNOSIS — M48061 Spinal stenosis, lumbar region without neurogenic claudication: Secondary | ICD-10-CM | POA: Diagnosis not present

## 2018-10-12 DIAGNOSIS — L98424 Non-pressure chronic ulcer of back with necrosis of bone: Secondary | ICD-10-CM | POA: Diagnosis not present

## 2018-10-12 DIAGNOSIS — I959 Hypotension, unspecified: Secondary | ICD-10-CM | POA: Diagnosis present

## 2018-10-12 DIAGNOSIS — R1110 Vomiting, unspecified: Secondary | ICD-10-CM | POA: Diagnosis not present

## 2018-10-12 DIAGNOSIS — R5381 Other malaise: Secondary | ICD-10-CM | POA: Diagnosis not present

## 2018-10-12 DIAGNOSIS — I10 Essential (primary) hypertension: Secondary | ICD-10-CM | POA: Diagnosis not present

## 2018-10-12 DIAGNOSIS — Z7189 Other specified counseling: Secondary | ICD-10-CM | POA: Diagnosis not present

## 2018-10-12 LAB — BASIC METABOLIC PANEL
Anion gap: 12 (ref 5–15)
BUN: 94 mg/dL — ABNORMAL HIGH (ref 8–23)
CO2: 15 mmol/L — ABNORMAL LOW (ref 22–32)
Calcium: 9.5 mg/dL (ref 8.9–10.3)
Chloride: 111 mmol/L (ref 98–111)
Creatinine, Ser: 1.77 mg/dL — ABNORMAL HIGH (ref 0.44–1.00)
GFR calc Af Amer: 30 mL/min — ABNORMAL LOW (ref >60)
GFR calc non Af Amer: 26 mL/min — ABNORMAL LOW (ref >60)
Glucose, Bld: 159 mg/dL — ABNORMAL HIGH (ref 70–99)
Potassium: 5.1 mmol/L (ref 3.5–5.1)
Sodium: 138 mmol/L (ref 135–145)

## 2018-10-12 LAB — CBC WITH DIFFERENTIAL/PLATELET
Abs Immature Granulocytes: 0.21 10*3/uL — ABNORMAL HIGH (ref 0.00–0.07)
Basophils Absolute: 0.1 10*3/uL (ref 0.0–0.1)
Basophils Relative: 1 %
Eosinophils Absolute: 0.2 10*3/uL (ref 0.0–0.5)
Eosinophils Relative: 1 %
HCT: 45.4 % (ref 36.0–46.0)
Hemoglobin: 14.1 g/dL (ref 12.0–15.0)
Immature Granulocytes: 2 %
Lymphocytes Relative: 14 %
Lymphs Abs: 2 10*3/uL (ref 0.7–4.0)
MCH: 27.2 pg (ref 26.0–34.0)
MCHC: 31.1 g/dL (ref 30.0–36.0)
MCV: 87.6 fL (ref 80.0–100.0)
Monocytes Absolute: 0.8 10*3/uL (ref 0.1–1.0)
Monocytes Relative: 6 %
Neutro Abs: 10.6 10*3/uL — ABNORMAL HIGH (ref 1.7–7.7)
Neutrophils Relative %: 76 %
Platelets: 483 10*3/uL — ABNORMAL HIGH (ref 150–400)
RBC: 5.18 MIL/uL — ABNORMAL HIGH (ref 3.87–5.11)
RDW: 16 % — ABNORMAL HIGH (ref 11.5–15.5)
WBC: 13.8 10*3/uL — ABNORMAL HIGH (ref 4.0–10.5)
nRBC: 0 % (ref 0.0–0.2)

## 2018-10-12 LAB — URINALYSIS, COMPLETE (UACMP) WITH MICROSCOPIC
Bilirubin Urine: NEGATIVE
Glucose, UA: NEGATIVE mg/dL
Ketones, ur: NEGATIVE mg/dL
Nitrite: NEGATIVE
Protein, ur: 30 mg/dL — AB
Specific Gravity, Urine: 1.016 (ref 1.005–1.030)
pH: 7 (ref 5.0–8.0)

## 2018-10-12 LAB — COMPREHENSIVE METABOLIC PANEL
ALT: 25 U/L (ref 0–44)
AST: 27 U/L (ref 15–41)
Albumin: 2.5 g/dL — ABNORMAL LOW (ref 3.5–5.0)
Alkaline Phosphatase: 114 U/L (ref 38–126)
Anion gap: 11 (ref 5–15)
BUN: 94 mg/dL — ABNORMAL HIGH (ref 8–23)
CO2: 18 mmol/L — ABNORMAL LOW (ref 22–32)
Calcium: 10.2 mg/dL (ref 8.9–10.3)
Chloride: 108 mmol/L (ref 98–111)
Creatinine, Ser: 1.78 mg/dL — ABNORMAL HIGH (ref 0.44–1.00)
GFR calc Af Amer: 29 mL/min — ABNORMAL LOW (ref >60)
GFR calc non Af Amer: 25 mL/min — ABNORMAL LOW (ref >60)
Glucose, Bld: 224 mg/dL — ABNORMAL HIGH (ref 70–99)
Potassium: 5.7 mmol/L — ABNORMAL HIGH (ref 3.5–5.1)
Sodium: 137 mmol/L (ref 135–145)
Total Bilirubin: 0.5 mg/dL (ref 0.3–1.2)
Total Protein: 7.7 g/dL (ref 6.5–8.1)

## 2018-10-12 LAB — TROPONIN I: Troponin I: <0.03 ng/mL (ref <0.03)

## 2018-10-12 MED ORDER — FOSFOMYCIN TROMETHAMINE 3 G PO PACK
3.0000 g | PACK | Freq: Once | ORAL | Status: AC
  Administered 2018-10-12: 19:00:00 3 g via ORAL
  Filled 2018-10-12: qty 3

## 2018-10-12 MED ORDER — SODIUM CHLORIDE 0.9 % IV BOLUS
1000.0000 mL | Freq: Once | INTRAVENOUS | Status: AC
  Administered 2018-10-12: 1000 mL via INTRAVENOUS

## 2018-10-12 NOTE — Telephone Encounter (Signed)
Pt's daughter state that she was not at home right now but as soon as she got home they would call 911 to have them transport her to the hospital.

## 2018-10-12 NOTE — ED Notes (Signed)
This EDT, Paige RN and Amy RN transferred pt to a hospital better for better comfort throughout pt stay

## 2018-10-12 NOTE — ED Notes (Signed)
Hospital bed requested.

## 2018-10-12 NOTE — ED Provider Notes (Signed)
[facility] Emergency Department Provider Note  Time seen: 5:14 PM  I have reviewed the triage vital signs and the nursing notes.   HISTORY  Chief Complaint Failure To Thrive   HPI Monica [name] is a 85 y.o. female with a past medical history of COPD, diabetes, hypertension, hyperlipidemia, CKD, presents to the emergency department for generalized weakness.  According to the patient for the past 2 weeks she has not been able to ambulate has not been able to get to the restroom, etc. due to generalized fatigue and weakness.  Patient denies any focal deficits or weakness.  Denies any fever cough or congestion.  Denies any sick contacts.  Denies any abdominal pain or chest pain.  Denies any nausea vomiting or diarrhea.  Patient did recently complete a course of antibiotics for urinary tract infection but denies any dysuria at this time.  Overall the patient appears well but does appear somewhat deconditioned.    Past Medical History:  Diagnosis Date  . B12 deficiency   . COPD (chronic obstructive pulmonary disease) ([place])   . Diabetes mellitus   . H/O: Bell's palsy   . Hyperlipidemia   . Hypertension   . Lung mass April 2011   s/p biopsy, no cancer cells found, Repeat CT Dec 2012 unchanged    Patient Active Problem List   Diagnosis Date Noted  . Spinal stenosis of lumbar region 10/06/2018  . Fecal incontinence 10/06/2018  . Type 2 diabetes mellitus with diabetic nephropathy, with long-term current use of insulin ([place]) 09/27/2018  . CKD stage 3 due to type 2 diabetes mellitus ([place]) 09/27/2018  . Delirium due to another medical condition, acute, hyperactive 09/27/2018  . Impaired ambulation 09/27/2018  . Recurrent falls 06/11/2017  . Dystrophic nail 11/13/2016  . Allergic rhinitis 09/30/2015  . Major depressive disorder, recurrent episode with anxious distress ([place]) 10/10/2013  . Vitamin D deficiency 10/08/2013  . Bilateral chronic knee pain 09/21/2011  .  Tobacco abuse counseling 07/08/2011  . Incontinence of urine 06/13/2011  . History of tobacco abuse 06/11/2011  . Hyperlipidemia   . Hypertension   . COPD (chronic obstructive pulmonary disease) ([place])   . B12 deficiency   . Lung mass     Past Surgical History:  Procedure Laterality Date  . ABDOMINAL HYSTERECTOMY    . CHOLECYSTECTOMY    . LUNG BIOPSY  2012   for positive PET, negative biopsy for malignancy  . SPINE SURGERY  2001   Lumbar    Prior to Admission medications   Medication Sig Start Date End Date Taking? Authorizing Provider  ACCU-CHEK SMARTVIEW test strip USE 2 TIMES DAILY FOR DIABETIC TESTING 12/08/14   [name], MD  acetaminophen (TYLENOL) 500 MG tablet Take 500 mg by mouth as needed.      [provider]  atenolol (TENORMIN) 50 MG tablet TAKE 1 TABLET BY MOUTH TWICE A DAY 06/05/18   [name], MD  atorvastatin (LIPITOR) 80 MG tablet TAKE ONE TABLET BY MOUTH EVERY DAY 02/19/17   [name], MD  B-D INS SYR ULTRAFINE .3CC/30G 30G X 1/2" 0.3 ML MISC USE AS DIRECTED 04/12/16   [name], MD  B-D INS SYR ULTRAFINE .3CC/30G 30G X 1/2" 0.3 ML MISC USE AS DIRECTED 05/19/17   [name], MD  BD INSULIN SYRINGE U/F 30G X 1/2" 0.5 ML MISC USE AS DIRECTED 03/17/18   [name], MD  blood glucose meter kit and supplies Use to  check blood sugars up to two times daily. Dispense based on patient and insurance preference. (FOR ICD-10 E10.9, E11.9). 09/29/18   [name], MD  cyanocobalamin (,VITAMIN B-12,) 1000 MCG/ML injection INJECT 1ML INTRAMUSCULAR ONCE WEEKLY FOR 3 WEEKS THEN ONCE A MONTH 06/19/18   [name], MD  diclofenac sodium (VOLTAREN) 1 % GEL Apply 4 g topically 4 (four) times daily. 07/20/18   Triplett, Johnette Abraham B, FNP  Incontinence Supply Disposable (DISPOSABLE BRIEF LARGE) MISC 1 application by Does not apply route 2 (two) times daily. 06/11/11   [name], MD  insulin NPH Human (HUMULIN N) 100 UNIT/ML injection INJECT 20  UNITS AT BEDTIME AND 5 UNITS THE THE MORNING UNLESS BG IS LESS THAN 150 Patient taking differently: INJECT 10 UNITS AT BEDTIME AND 5 UNITS THE THE MORNING UNLESS BG IS LESS THAN 150 08/20/18   [name], MD  lamoTRIgine (LAMICTAL) 25 MG tablet TAKE 2 TABLETS BY MOUTH AT BEDTIME (NEEDS  OFFICE  VISIT  PRIOR  TO  ANYMORE  REFILLS) 09/28/18   [name], MD  lisinopril (PRINIVIL,ZESTRIL) 40 MG tablet TAKE 1 TABLET BY MOUTH DAILY 12/29/17   [name], MD  metFORMIN (GLUCOPHAGE) 850 MG tablet TAKE ONE TABLET BY MOUTH TWICE DAILY WITH FOOD AS DIRECTED. 07/27/18   [name], MD  PHARMACIST CHOICE LANCETS MISC USE 2 TIMES DAILY FOR DIABETIC TESTING 12/08/14   [name], MD  QUEtiapine (SEROQUEL) 25 MG tablet Take 1 tablet (25 mg total) by mouth 2 (two) times daily. 10/06/18   [name], MD  sertraline (ZOLOFT) 50 MG tablet Take 1 tablet (50 mg total) by mouth daily. 09/25/18   [name], MD  Syringe/Needle, Disp, (SYRINGE 3CC/25GX1") 25G X 1" 3 ML MISC Use for b12 injections 06/10/17   [name], MD  Syringe/Needle, Disp, (SYRINGE LUER LOCK) 25G X 1" 3 ML MISC 1 applicator by Does not apply route every 30 (thirty) days. 11/21/16   [name], MD  traMADol (ULTRAM) 50 MG tablet Take 1 tablet (50 mg total) by mouth every 12 (twelve) hours as needed for severe pain. 09/21/18   [name], MD    No Known Allergies  Family History  Problem Relation Age of Onset  . Cancer Mother 17       Breast  . Diabetes Mother   . Heart disease Father 64       Acute MI  . Heart disease Sister   . Arthritis Sister   . Heart disease Brother     Social History Social History   Tobacco Use  . Smoking status: Former Smoker    Packs/day: 0.50    Years: 5.00    Pack years: 2.50    Types: Cigarettes  . Smokeless tobacco: Never Used  . Tobacco comment: quit approx 2016  Substance Use Topics  . Alcohol use: No  . Drug use: No    Review of Systems Constitutional:  Negative for fever.  Positive for generalized weakness ENT: Negative for recent illness/congestion Cardiovascular: Negative for chest pain. Respiratory: Negative for shortness of breath. Gastrointestinal: Negative for abdominal pain, vomiting and diarrhea. Genitourinary: Negative for urinary compaints Musculoskeletal: Negative for musculoskeletal complaints Skin: Negative for skin complaints  Neurological: Negative for headache All other ROS negative  ____________________________________________   PHYSICAL EXAM:  VITAL SIGNS: ED Triage Vitals  Enc Vitals Group     BP --      Pulse --  Resp --      Temp --      Temp src --      SpO2 --      Weight 10/12/18 1710 174 lb (78.9 kg)     Height 10/12/18 1710 '5\' 7"'  (1.702 m)     Head Circumference --      Peak Flow --      Pain Score 10/12/18 1709 10     Pain Loc --      Pain Edu? --      Excl. in [place]? --    Constitutional: Alert and oriented. Well appearing and in no distress. Eyes: Normal exam ENT      Head: Normocephalic and atraumatic.      Mouth/Throat: Mucous membranes are moist. Cardiovascular: Normal rate, regular rhythm.  Respiratory: Normal respiratory effort without tachypnea nor retractions. Breath sounds are clear  Gastrointestinal: Soft and nontender. No distention.   Musculoskeletal: Nontender with normal range of motion in all extremities. Neurologic:  Normal speech and language.  No gross deficits.  Equal grip strengths.  No cranial nerve deficits. Skin:  Skin is warm, dry and intact.  Psychiatric: Mood and affect are normal. Speech and behavior are normal.   ____________________________________________    EKG  EKG viewed and interpreted by myself shows a normal sinus rhythm at 76 bpm with a narrow QRS, normal axis, normal intervals, no concerning ST changes.  ____________________________________________    RADIOLOGY  CT scan shows atrophy but no acute abnormality. Chest x-ray  negative  ____________________________________________   INITIAL IMPRESSION / ASSESSMENT AND PLAN / ED COURSE  Pertinent labs & imaging results that were available during my care of the patient were reviewed by me and considered in my medical decision making (see chart for details).   Patient presents to the emergency department with generalized fatigue and weakness ongoing x2 weeks now with difficulty ambulating or getting out of bed.  Patient lives at home with family.  Overall the patient appears well, no acute distress.  Patient does not appear to have any focal deficits on exam.  Differential at this time would include metabolic or electrolyte abnormality, dehydration, renal insufficiency, ACS, CVA or infectious etiology such as UTI or pneumonia.  We will check labs, CT scan of the head as well as a chest x-ray and continue to closely monitor.  EMS did bring rhythm strips and with them the patient appeared to go into rapid A. fib during transport but is currently in a normal sinus rhythm.  Patient's work-up does show a mild hyperkalemia with mild renal insufficiency.  We will dose IV fluids and recheck a chemistry.  Patient's urinalysis is equivocal we will dose of fosfomycin and send a urine culture.  I discussed the patient with her granddaughter who states over the past several months the patient has become progressively more weak especially over the past 2 weeks where she has been refusing to get up out of bed or ambulate at all.  Patient has now formed a sacral ulcer on her buttocks because she is not able to get out of bed and family is not able to get her out of bed.  Here patient has no acute complaints.  Does have a moderate sized sacral ulceration however does not appear to be actively infected.  We will have social work and physical therapy see tomorrow to see if the patient qualifies for short-term rehab placement at least would require home health and PT.  We will keep in the  emergency  department tonight and have social work and PT see in the morning.  Lab work improved with IV fluids.  Social work and PT pending.  CYLAH FANNIN was evaluated in Emergency Department on 10/12/2018 for the symptoms described in the history of present illness. She was evaluated in the context of the global COVID-19 pandemic, which necessitated consideration that the patient might be at risk for infection with the SARS-CoV-2 virus that causes COVID-19. Institutional protocols and algorithms that pertain to the evaluation of patients at risk for COVID-19 are in a state of rapid change based on information released by regulatory bodies including the CDC and federal and state organizations. These policies and algorithms were followed during the patient's care in the ED.  ____________________________________________   FINAL CLINICAL IMPRESSION(S) / ED DIAGNOSES  Weakness   Harvest Dark, MD 10/12/18 2048

## 2018-10-12 NOTE — ED Notes (Signed)
PT repositioned onto left side

## 2018-10-12 NOTE — ED Triage Notes (Signed)
PT to ED from home via EMS C/O decreased immobility for 2 weeks Just finished course of antibiotics for UTI. Family presents concern for sacral pressure wound. PT AO per ems   113/66 77 bpm 96% RA HX diabetes 248 cbg

## 2018-10-12 NOTE — ED Notes (Signed)
ABD pad placed on sacrum

## 2018-10-12 NOTE — Telephone Encounter (Signed)
Spoke with pt's granddaughter and she was advised that the pt needs to go to the ED for evaluation and that they need to let the hospital know that the pt needs complete care. Pt's granddaughter gave a verbal understanding. Per Dr. Lupita Dawn verbal.

## 2018-10-12 NOTE — Telephone Encounter (Signed)
Copied from [place] [id number]. Topic: Quick Communication - See Telephone Encounter >> Oct 12, 2018 11:17 AM Nils Flack wrote: CRM for notification. See Telephone encounter for: 10/12/18. ReGAN called - she says pt is still having hallucinations and that pt is developing bed sores because she is not getting out of bed. Please call back at [phone] ( home phone not working)

## 2018-10-12 NOTE — Telephone Encounter (Signed)
Pt granddaughter called back - please call at [phone]. Per Fransisco Beau ok to send another msg since issues were addressed at recent virtual visit.

## 2018-10-12 NOTE — ED Notes (Signed)
[place] daughter called to give extra info, per grand daughter:  Pt spinal stenosis Zoloft increased to 100mg /day 2 weeks ago and pt began to hallucinate, zoloft bumped back down-hallucinations intermittently

## 2018-10-12 NOTE — ED Notes (Addendum)
Pt's mouth swabbed with lemon swabs. Pt drank a few sips of water with encouragement after initially refusing.  Pt oriented to self at this time, disoriented to place and time. Pt concerned that spo2 censor on finger is chocolate. Respirations even and unlabored. Pt has no complaints at this time.  Dr. Kerman Passey made aware of pt's mentation.

## 2018-10-12 NOTE — ED Notes (Signed)
PT transferred to hospital bed. Bed alarm in place. Cleansed of incontinent stool.

## 2018-10-12 NOTE — Telephone Encounter (Signed)
Attempted to call all numbers in chart and the number listed below. Have not been able to get an answer and not able to leave a voicemail. If pt's granddaughter returns call please transfer to our office.

## 2018-10-13 ENCOUNTER — Other Ambulatory Visit: Payer: Self-pay

## 2018-10-13 DIAGNOSIS — N390 Urinary tract infection, site not specified: Secondary | ICD-10-CM | POA: Diagnosis not present

## 2018-10-13 DIAGNOSIS — N184 Chronic kidney disease, stage 4 (severe): Secondary | ICD-10-CM | POA: Diagnosis not present

## 2018-10-13 DIAGNOSIS — R531 Weakness: Secondary | ICD-10-CM

## 2018-10-13 DIAGNOSIS — I10 Essential (primary) hypertension: Secondary | ICD-10-CM | POA: Diagnosis not present

## 2018-10-13 DIAGNOSIS — L8990 Pressure ulcer of unspecified site, unspecified stage: Secondary | ICD-10-CM

## 2018-10-13 DIAGNOSIS — L98424 Non-pressure chronic ulcer of back with necrosis of bone: Secondary | ICD-10-CM

## 2018-10-13 LAB — GLUCOSE, CAPILLARY
Glucose-Capillary: 182 mg/dL — ABNORMAL HIGH (ref 70–99)
Glucose-Capillary: 150 mg/dL — ABNORMAL HIGH (ref 70–99)
Glucose-Capillary: 125 mg/dL — ABNORMAL HIGH (ref 70–99)
Glucose-Capillary: 131 mg/dL — ABNORMAL HIGH (ref 70–99)

## 2018-10-13 LAB — TSH: TSH: 4.615 u[IU]/mL — ABNORMAL HIGH (ref 0.350–4.500)

## 2018-10-13 LAB — URINE CULTURE: Culture: >=100000 — AB

## 2018-10-13 MED ORDER — ONDANSETRON HCL 4 MG/2ML IJ SOLN: 4.0000 mg | Freq: Four times a day (QID) | INTRAMUSCULAR | Status: DC | PRN

## 2018-10-13 MED ORDER — HEPARIN SODIUM (PORCINE) 5000 UNIT/ML IJ SOLN
5000.0000 [IU] | Freq: Three times a day (TID) | INTRAMUSCULAR | Status: DC
  Administered 2018-10-13 – 2018-10-15 (×7): 5000 [IU] via SUBCUTANEOUS
  Filled 2018-10-13 (×6): qty 1

## 2018-10-13 MED ORDER — DOCUSATE SODIUM 100 MG PO CAPS
100.0000 mg | ORAL_CAPSULE | Freq: Two times a day (BID) | ORAL | Status: DC
  Administered 2018-10-13 – 2018-10-17 (×9): 100 mg via ORAL
  Filled 2018-10-13 (×9): qty 1

## 2018-10-13 MED ORDER — INSULIN ASPART 100 UNIT/ML ~~LOC~~ SOLN: 0.0000 [IU] | Freq: Every day | SUBCUTANEOUS | Status: DC

## 2018-10-13 MED ORDER — ACETAMINOPHEN 650 MG RE SUPP: 650.0000 mg | Freq: Four times a day (QID) | RECTAL | Status: DC | PRN

## 2018-10-13 MED ORDER — SODIUM CHLORIDE 0.9 % IV BOLUS
500.0000 mL | Freq: Once | INTRAVENOUS | Status: AC
  Administered 2018-10-13: 12:00:00 500 mL via INTRAVENOUS

## 2018-10-13 MED ORDER — LAMOTRIGINE 25 MG PO TABS
50.0000 mg | ORAL_TABLET | Freq: Every day | ORAL | Status: DC
  Administered 2018-10-13 – 2018-10-19 (×7): 50 mg via ORAL
  Filled 2018-10-13 (×7): qty 2

## 2018-10-13 MED ORDER — ACETAMINOPHEN 325 MG PO TABS
650.0000 mg | ORAL_TABLET | Freq: Four times a day (QID) | ORAL | Status: DC | PRN
  Administered 2018-10-13 – 2018-10-17 (×2): 650 mg via ORAL
  Filled 2018-10-13 (×2): qty 2

## 2018-10-13 MED ORDER — SODIUM CHLORIDE 0.9 % IV BOLUS
1000.0000 mL | Freq: Once | INTRAVENOUS | Status: AC
  Administered 2018-10-13: 04:00:00 1000 mL via INTRAVENOUS

## 2018-10-13 MED ORDER — ONDANSETRON HCL 4 MG PO TABS: 4.0000 mg | ORAL_TABLET | Freq: Four times a day (QID) | ORAL | Status: DC | PRN

## 2018-10-13 MED ORDER — SERTRALINE HCL 50 MG PO TABS
50.0000 mg | ORAL_TABLET | Freq: Every day | ORAL | Status: DC
  Administered 2018-10-13 – 2018-10-17 (×5): 50 mg via ORAL
  Filled 2018-10-13 (×5): qty 1

## 2018-10-13 MED ORDER — SODIUM CHLORIDE 0.9 % IV SOLN
INTRAVENOUS | Status: DC
  Administered 2018-10-13 – 2018-10-17 (×8): via INTRAVENOUS

## 2018-10-13 MED ORDER — SODIUM CHLORIDE 0.9 % IV SOLN
1.0000 g | Freq: Every day | INTRAVENOUS | Status: AC
  Administered 2018-10-13 – 2018-10-15 (×3): 1 g via INTRAVENOUS
  Filled 2018-10-13 (×3): qty 1

## 2018-10-13 MED ORDER — SODIUM CHLORIDE 0.9 % IV BOLUS
500.0000 mL | Freq: Once | INTRAVENOUS | Status: AC
  Administered 2018-10-13: 500 mL via INTRAVENOUS

## 2018-10-13 MED ORDER — ENSURE MAX PROTEIN PO LIQD
11.0000 [oz_av] | Freq: Two times a day (BID) | ORAL | Status: DC
  Administered 2018-10-13 – 2018-10-20 (×10): 11 [oz_av] via ORAL
  Filled 2018-10-13: qty 330

## 2018-10-13 MED ORDER — COLLAGENASE 250 UNIT/GM EX OINT
TOPICAL_OINTMENT | Freq: Every day | CUTANEOUS | Status: DC
  Administered 2018-10-13 – 2018-10-17 (×4): via TOPICAL
  Filled 2018-10-13: qty 30

## 2018-10-13 MED ORDER — INSULIN ASPART 100 UNIT/ML ~~LOC~~ SOLN
0.0000 [IU] | Freq: Three times a day (TID) | SUBCUTANEOUS | Status: DC
  Administered 2018-10-13: 3 [IU] via SUBCUTANEOUS
  Administered 2018-10-13 – 2018-10-15 (×5): 2 [IU] via SUBCUTANEOUS
  Administered 2018-10-16: 3 [IU] via SUBCUTANEOUS
  Administered 2018-10-16 – 2018-10-17 (×2): 2 [IU] via SUBCUTANEOUS
  Administered 2018-10-17 – 2018-10-18 (×2): 3 [IU] via SUBCUTANEOUS
  Administered 2018-10-18: 2 [IU] via SUBCUTANEOUS
  Administered 2018-10-19: 3 [IU] via SUBCUTANEOUS
  Administered 2018-10-19 (×2): 2 [IU] via SUBCUTANEOUS
  Administered 2018-10-20: 5 [IU] via SUBCUTANEOUS
  Administered 2018-10-20: 2 [IU] via SUBCUTANEOUS
  Filled 2018-10-13 (×17): qty 1

## 2018-10-13 MED ORDER — QUETIAPINE FUMARATE 25 MG PO TABS
25.0000 mg | ORAL_TABLET | Freq: Two times a day (BID) | ORAL | Status: DC
  Administered 2018-10-13 – 2018-10-15 (×5): 25 mg via ORAL
  Filled 2018-10-13 (×5): qty 1

## 2018-10-13 MED ORDER — OCUVITE-LUTEIN PO CAPS
1.0000 | ORAL_CAPSULE | Freq: Every day | ORAL | Status: DC
  Administered 2018-10-14 – 2018-10-20 (×6): 1 via ORAL
  Filled 2018-10-13 (×8): qty 1

## 2018-10-13 NOTE — Progress Notes (Signed)
Discussed /W Granddaughter about surgical options. She d/w  the rest of the family and at this time they do not want  any surgical procedures at this time. We will continue to follow. May entertain palliative care at some point in time. I am not sure if the family is ready at this point in time for palliative measures.

## 2018-10-13 NOTE — Progress Notes (Signed)
[facility] [place] NAME: Monica [name]    MR#:  [id number]  DATE OF BIRTH:  11/01/1929  SUBJECTIVE:   She admitted to the hospital earlier today due to weakness, multiple pressure sores and suspected UTI.  Patient says that she is profoundly weak and unable to get out of bed at home.  She denies any dysuria or urinary frequency.  REVIEW OF SYSTEMS:    Review of Systems  Constitutional: Negative for chills and fever.  HENT: Negative for congestion and tinnitus.   Eyes: Negative for blurred vision and double vision.  Respiratory: Negative for cough, shortness of breath and wheezing.   Cardiovascular: Negative for chest pain, orthopnea and PND.  Gastrointestinal: Negative for abdominal pain, diarrhea, nausea and vomiting.  Genitourinary: Negative for dysuria and hematuria.  Neurological: Positive for weakness (generalized. ). Negative for dizziness, sensory change and focal weakness.  All other systems reviewed and are negative.   Nutrition: Heart Healthy/Carb modified Tolerating Diet: Yes Tolerating PT: Eval noted.   DRUG ALLERGIES:  No Known Allergies  VITALS:  Blood pressure (!) 89/56, pulse 85, temperature 97.8 F (36.6 C), temperature source Oral, resp. rate (!) 24, height 5\' 7"  (1.702 m), weight 77 kg, SpO2 97 %.  PHYSICAL EXAMINATION:   Physical Exam  GENERAL:  85 y.o.-year-old patient lying in bed in no acute distress.  EYES: Pupils equal, round, reactive to light and accommodation. No scleral icterus. Extraocular muscles intact.  HEENT: Head atraumatic, normocephalic. Oropharynx and nasopharynx clear.  NECK:  Supple, no jugular venous distention. No thyroid enlargement, no tenderness.  LUNGS: Normal breath sounds bilaterally, no wheezing, rales, rhonchi. No use of accessory muscles of respiration.  CARDIOVASCULAR: S1, S2 normal. No murmurs, rubs, or gallops.  ABDOMEN: Soft, nontender, nondistended. Bowel sounds present.  No organomegaly or mass.  EXTREMITIES: No cyanosis, clubbing or edema b/l.    NEUROLOGIC: Cranial nerves II through XII are intact. No focal Motor or sensory deficits b/l. Globally weak.   PSYCHIATRIC: The patient is alert and oriented x 3.  SKIN: No obvious rash, lesion, or ulcer.   Pressures sores as below             LABORATORY PANEL:   CBC Recent Labs  Lab 10/12/18 1715  WBC 13.8*  HGB 14.1  HCT 45.4  PLT 483*   ------------------------------------------------------------------------------------------------------------------  Chemistries  Recent Labs  Lab 10/12/18 1715 10/12/18 2019  NA 137 138  K 5.7* 5.1  CL 108 111  CO2 18* 15*  GLUCOSE 224* 159*  BUN 94* 94*  CREATININE 1.78* 1.77*  CALCIUM 10.2 9.5  AST 27  --   ALT 25  --   ALKPHOS 114  --   BILITOT 0.5  --    ------------------------------------------------------------------------------------------------------------------  Cardiac Enzymes Recent Labs  Lab 10/12/18 1715  TROPONINI <0.03   ------------------------------------------------------------------------------------------------------------------  RADIOLOGY:  Ct Head Wo Contrast  Result Date: 10/12/2018 CLINICAL DATA:  Muscle weakness. EXAM: CT HEAD WITHOUT CONTRAST TECHNIQUE: Contiguous axial images were obtained from the base of the skull through the vertex without intravenous contrast. COMPARISON:  CT scan of February 25, 2012. FINDINGS: Brain: Mild diffuse cortical atrophy is noted. Mild chronic ischemic white matter disease is noted. No mass effect or midline shift is noted. Ventricular size is within normal limits. There is no evidence of mass lesion, hemorrhage or acute infarction. Vascular: No hyperdense vessel or unexpected calcification. Skull: Normal. Negative for fracture or focal lesion. Sinuses/Orbits: No acute  finding. Other: None. IMPRESSION: Mild diffuse cortical atrophy. Mild chronic ischemic white matter disease. No  acute intracranial abnormality seen. Electronically Signed   By: Marijo Conception M.D.   On: 10/12/2018 18:29   Dg Chest Portable 1 View  Result Date: 10/12/2018 CLINICAL DATA:  Weakness. EXAM: PORTABLE CHEST 1 VIEW COMPARISON:  Radiographs of March 22, 2012. FINDINGS: The heart size and mediastinal contours are within normal limits. Both lungs are clear. No pneumothorax or pleural effusion is noted. The visualized skeletal structures are unremarkable. IMPRESSION: No active disease. Electronically Signed   By: Marijo Conception M.D.   On: 10/12/2018 17:48     ASSESSMENT AND PLAN:   85 year old female with past medical history of hypertension, hyperlipidemia, diabetes, COPD, history of B12 deficiency who presented to the hospital due to generalized weakness and difficulty ambulating and noted to have a UTI and multiple pressure sores.  1.  Generalized weakness/difficulty ambulating-secondary to deconditioning and also possible underlying UTI. - Patient was given oral fosfomycin for the UTI in the ER, I will initiate IV ceftriaxone.  Follow urine cultures. - Appreciate physical therapy evaluation and patient will likely need short-term rehab.  2.  UTI- we will treat the patient with IV ceftriaxone. -Follow urine cultures.  3.  Pressure ulcers- patient has multiple pressure ulcers with stage I and II on the left and right ankle respectively and stage III-IV on the sacrum. - Seen by wound remotely and the recommend continued local wound care and possible debridement of the sacral ulcer. - We will get surgical consult to evaluate the sacral ulcer.  4.  Diabetes type 2 without complication-continue sliding scale insulin.  Follow blood sugars.  5.  Depression-continue Seroquel, Lamictal, Zoloft.     All the records are reviewed and case discussed with Care Management/Social Worker. Management plans discussed with the patient, family and they are in agreement.  CODE STATUS: Full code  DVT  Prophylaxis: Hep. SQ  TOTAL TIME TAKING CARE OF THIS PATIENT: 30 minutes.   POSSIBLE D/C IN 1-2 DAYS, DEPENDING ON CLINICAL CONDITION.   Henreitta Leber M.D on 10/13/2018 at 1:35 PM  Between 7am to 6pm - Pager - [phone]  After 6pm go to [email or web address] - Proofreader  [employer] [facility]  Office  [phone]  CC: Primary care physician; [name], MD

## 2018-10-13 NOTE — ED Notes (Signed)
ED TO INPATIENT HANDOFF REPORT  ED Nurse Name and Phone #: Marin Olp Name/Age/Gender Ruby Cola 85 y.o. female Room/Bed: ED25A/ED25A  Code Status   Code Status: Not on file  Home/SNF/Other Home Patient oriented to: self Is this baseline? No   Triage Complete: Triage complete  Chief Complaint weakness  Triage Note PT to ED from home via EMS C/O decreased immobility for 2 weeks Just finished course of antibiotics for UTI. Family presents concern for sacral pressure wound. PT AO per ems   113/66 77 bpm 96% RA HX diabetes 248 cbg   Allergies No Known Allergies  Level of Care/Admitting Diagnosis ED Disposition    ED Disposition Condition [facility] Area: [place] [[id number]]  Level of Care: Med-Surg [16]  Covid Evaluation: N/A  Diagnosis: Weakness [062376]  Admitting Physician: Harrie Foreman [[id number]]  Attending Physician: Harrie Foreman [phone]  PT Class (Do Not Modify): Observation [104]  PT Acc Code (Do Not Modify): Observation [10022]       B Medical/Surgery History Past Medical History:  Diagnosis Date  . B12 deficiency   . COPD (chronic obstructive pulmonary disease) ([place])   . Diabetes mellitus   . H/O: Bell's palsy   . Hyperlipidemia   . Hypertension   . Lung mass April 2011   s/p biopsy, no cancer cells found, Repeat CT Dec 2012 unchanged   Past Surgical History:  Procedure Laterality Date  . ABDOMINAL HYSTERECTOMY    . CHOLECYSTECTOMY    . LUNG BIOPSY  2012   for positive PET, negative biopsy for malignancy  . SPINE SURGERY  2001   Lumbar     A IV Location/Drains/Wounds Patient Lines/Drains/Airways Status   Active Line/Drains/Airways    Name:   Placement date:   Placement time:   Site:   Days:   Peripheral IV 10/12/18 Right Hand   10/12/18    1653    Hand   1   External Urinary Catheter   10/13/18    0115    -   less than 1          Intake/Output Last 24  hours  Intake/Output Summary (Last 24 hours) at 10/13/2018 0401 Last data filed at 10/13/2018 0256 Gross per 24 hour  Intake 1000 ml  Output 200 ml  Net 800 ml    Labs/Imaging Results for orders placed or performed during the hospital encounter of 10/12/18 (from the past 48 hour(s))  CBC with Differential     Status: Abnormal   Collection Time: 10/12/18  5:15 PM  Result Value Ref Range   WBC 13.8 (H) 4.0 - 10.5 K/uL   RBC 5.18 (H) 3.87 - 5.11 MIL/uL   Hemoglobin 14.1 12.0 - 15.0 g/dL   HCT 45.4 36.0 - 46.0 %   MCV 87.6 80.0 - 100.0 fL   MCH 27.2 26.0 - 34.0 pg   MCHC 31.1 30.0 - 36.0 g/dL   RDW 16.0 (H) 11.5 - 15.5 %   Platelets 483 (H) 150 - 400 K/uL   nRBC 0.0 0.0 - 0.2 %   Neutrophils Relative % 76 %   Neutro Abs 10.6 (H) 1.7 - 7.7 K/uL   Lymphocytes Relative 14 %   Lymphs Abs 2.0 0.7 - 4.0 K/uL   Monocytes Relative 6 %   Monocytes Absolute 0.8 0.1 - 1.0 K/uL   Eosinophils Relative 1 %   Eosinophils Absolute 0.2 0.0 - 0.5 K/uL   Basophils  Relative 1 %   Basophils Absolute 0.1 0.0 - 0.1 K/uL   Immature Granulocytes 2 %   Abs Immature Granulocytes 0.21 (H) 0.00 - 0.07 K/uL    Comment: Performed at [facility], [address] [place], [place] [address]  Comprehensive metabolic panel     Status: Abnormal   Collection Time: 10/12/18  5:15 PM  Result Value Ref Range   Sodium 137 135 - 145 mmol/L   Potassium 5.7 (H) 3.5 - 5.1 mmol/L   Chloride 108 98 - 111 mmol/L   CO2 18 (L) 22 - 32 mmol/L   Glucose, Bld 224 (H) 70 - 99 mg/dL   BUN 94 (H) 8 - 23 mg/dL   Creatinine, Ser 1.78 (H) 0.44 - 1.00 mg/dL   Calcium 10.2 8.9 - 10.3 mg/dL   Total Protein 7.7 6.5 - 8.1 g/dL   Albumin 2.5 (L) 3.5 - 5.0 g/dL   AST 27 15 - 41 U/L   ALT 25 0 - 44 U/L   Alkaline Phosphatase 114 38 - 126 U/L   Total Bilirubin 0.5 0.3 - 1.2 mg/dL   GFR calc non Af Amer 25 (L) >60 mL/min   GFR calc Af Amer 29 (L) >60 mL/min   Anion gap 11 5 - 15    Comment: Performed at [facility], [address] [place], [place] [address]  Troponin I - ONCE - STAT     Status: None   Collection Time: 10/12/18  5:15 PM  Result Value Ref Range   Troponin I <0.03 <0.03 ng/mL    Comment: Performed at [facility], [address] [place], [place] [address]  Urinalysis, Complete w Microscopic     Status: Abnormal   Collection Time: 10/12/18  6:10 PM  Result Value Ref Range   Color, Urine AMBER (A) YELLOW    Comment: BIOCHEMICALS MAY BE AFFECTED BY COLOR   APPearance CLOUDY (A) CLEAR   Specific Gravity, Urine 1.016 1.005 - 1.030   pH 7.0 5.0 - 8.0   Glucose, UA NEGATIVE NEGATIVE mg/dL   Hgb urine dipstick LARGE (A) NEGATIVE   Bilirubin Urine NEGATIVE NEGATIVE   Ketones, ur NEGATIVE NEGATIVE mg/dL   Protein, ur 30 (A) NEGATIVE mg/dL   Nitrite NEGATIVE NEGATIVE   Leukocytes,Ua SMALL (A) NEGATIVE   RBC / HPF 21-50 0 - 5 RBC/hpf   WBC, UA 21-50 0 - 5 WBC/hpf   Bacteria, UA RARE (A) NONE SEEN   Squamous Epithelial / LPF 0-5 0 - 5   Mucus PRESENT    Budding Yeast PRESENT    Hyaline Casts, UA PRESENT    Non Squamous Epithelial PRESENT (A) NONE SEEN    Comment: Performed at [facility], [address] [place], [place] [address]  Basic metabolic panel     Status: Abnormal   Collection Time: 10/12/18  8:19 PM  Result Value Ref Range   Sodium 138 135 - 145 mmol/L   Potassium 5.1 3.5 - 5.1 mmol/L   Chloride 111 98 - 111 mmol/L   CO2 15 (L) 22 - 32 mmol/L   Glucose, Bld 159 (H) 70 - 99 mg/dL   BUN 94 (H) 8 - 23 mg/dL   Creatinine, Ser 1.77 (H) 0.44 - 1.00 mg/dL   Calcium 9.5 8.9 - 10.3 mg/dL   GFR calc non Af Amer 26 (L) >60 mL/min   GFR calc Af Amer 30 (L) >60 mL/min   Anion gap 12 5 - 15  Comment: Performed at [facility], [address] [place], [place] [address]   Ct Head Wo Contrast  Result Date: 10/12/2018 CLINICAL DATA:  Muscle weakness. EXAM: CT HEAD WITHOUT CONTRAST TECHNIQUE: Contiguous axial images were obtained from the base of  the skull through the vertex without intravenous contrast. COMPARISON:  CT scan of February 25, 2012. FINDINGS: Brain: Mild diffuse cortical atrophy is noted. Mild chronic ischemic white matter disease is noted. No mass effect or midline shift is noted. Ventricular size is within normal limits. There is no evidence of mass lesion, hemorrhage or acute infarction. Vascular: No hyperdense vessel or unexpected calcification. Skull: Normal. Negative for fracture or focal lesion. Sinuses/Orbits: No acute finding. Other: None. IMPRESSION: Mild diffuse cortical atrophy. Mild chronic ischemic white matter disease. No acute intracranial abnormality seen. Electronically Signed   By: Marijo Conception M.D.   On: 10/12/2018 18:29   Dg Chest Portable 1 View  Result Date: 10/12/2018 CLINICAL DATA:  Weakness. EXAM: PORTABLE CHEST 1 VIEW COMPARISON:  Radiographs of March 22, 2012. FINDINGS: The heart size and mediastinal contours are within normal limits. Both lungs are clear. No pneumothorax or pleural effusion is noted. The visualized skeletal structures are unremarkable. IMPRESSION: No active disease. Electronically Signed   By: Marijo Conception M.D.   On: 10/12/2018 17:48    Pending Labs Unresulted Labs (From admission, onward)    Start     Ordered   10/12/18 1858  Urine Culture  Add-on,   AD     10/12/18 1857   Signed and Held  TSH  Add-on,   R     Signed and Held          Vitals/Pain Today's Vitals   10/13/18 0230 10/13/18 0330 10/13/18 0346 10/13/18 0350  BP: (!) 91/52 (!) 86/50 (!) 92/52 (!) 92/52  Pulse: 61 61 61 60  Resp:    18  Temp:      TempSrc:      SpO2: 96% 99% 99% 98%  Weight:      Height:      PainSc:    0-No pain    Isolation Precautions No active isolations  Medications Medications  fosfomycin (MONUROL) packet 3 g (3 g Oral Given 10/12/18 1902)  sodium chloride 0.9 % bolus 1,000 mL (0 mLs Intravenous Stopped 10/12/18 2018)  sodium chloride 0.9 % bolus 500 mL (500 mLs  Intravenous New Bag/Given 10/13/18 0141)    Mobility non-ambulatory Moderate fall risk   Focused Assessments Neuro Assessment Handoff:  Swallow screen pass? No  Cardiac Rhythm: Normal sinus rhythm       Neuro Assessment: Exceptions to WDL Neuro Checks:      Last Documented NIHSS Modified Score:   Has TPA been given? No If patient is a Neuro Trauma and patient is going to OR before floor call report to [place] nurse: [phone] or [phone]     R Recommendations: See Admitting Provider Note  Report given to:   Additional Notes: Pt has purewick in place and is incontinent and nonambulatory. This is not her baseline. Pt has been boarding in ED and her condition has decompensated. Pt has been hypotensive and has received a 500 ml bolus of NS x 2. Pt is A&Ox1 and this is not baseline.

## 2018-10-13 NOTE — H&P (Signed)
Monica [name] is an 85 y.o. female.   Chief Complaint: Weakness HPI: The patient with past medical history of diabetes, hypertension, spinal stenosis and COPD presents to the emergency department due to decreased mobility.  The patient's family is also concerned about a pressure ulcer on her back.  The patient recently had a urinary tract infection for which she completed antibiotic therapy.  Laboratory evaluation today shows recurrent UTI.  Furthermore, the patient is clinically dehydrated and generally weak.  Her blood pressure has been intermittently low but has been responsive to fluid boluses.  Given her generalized debility and intermittent hypotension the emergency department staff called the hospitalist service for admission.  Past Medical History:  Diagnosis Date  . B12 deficiency   . COPD (chronic obstructive pulmonary disease) ([place])   . Diabetes mellitus   . H/O: Bell's palsy   . Hyperlipidemia   . Hypertension   . Lung mass April 2011   s/p biopsy, no cancer cells found, Repeat CT Dec 2012 unchanged    Past Surgical History:  Procedure Laterality Date  . ABDOMINAL HYSTERECTOMY    . CHOLECYSTECTOMY    . LUNG BIOPSY  2012   for positive PET, negative biopsy for malignancy  . SPINE SURGERY  2001   Lumbar    Family History  Problem Relation Age of Onset  . Cancer Mother 4       Breast  . Diabetes Mother   . Heart disease Father 66       Acute MI  . Heart disease Sister   . Arthritis Sister   . Heart disease Brother    Social History:  reports that she has quit smoking. Her smoking use included cigarettes. She has a 2.50 pack-year smoking history. She has never used smokeless tobacco. She reports that she does not drink alcohol or use drugs.  Allergies: No Known Allergies  Facility-Administered Medications Prior to Admission  Medication Dose Route Frequency Provider Last Rate Last Dose  . cyanocobalamin ((VITAMIN B-12)) injection 1,000 mcg  1,000 mcg Intramuscular  Once [name], MD       Medications Prior to Admission  Medication Sig Dispense Refill  . ACCU-CHEK SMARTVIEW test strip USE 2 TIMES DAILY FOR DIABETIC TESTING 200 each 0  . acetaminophen (TYLENOL) 500 MG tablet Take 500 mg by mouth as needed.      Marland Kitchen atenolol (TENORMIN) 50 MG tablet TAKE 1 TABLET BY MOUTH TWICE A DAY 90 tablet 1  . atorvastatin (LIPITOR) 80 MG tablet TAKE ONE TABLET BY MOUTH EVERY DAY 90 tablet 2  . B-D INS SYR ULTRAFINE .3CC/30G 30G X 1/2" 0.3 ML MISC USE AS DIRECTED 100 each 2  . B-D INS SYR ULTRAFINE .3CC/30G 30G X 1/2" 0.3 ML MISC USE AS DIRECTED 100 each 2  . BD INSULIN SYRINGE U/F 30G X 1/2" 0.5 ML MISC USE AS DIRECTED 100 each 1  . blood glucose meter kit and supplies Use to check blood sugars up to two times daily. Dispense based on patient and insurance preference. (FOR ICD-10 E10.9, E11.9). 1 each 0  . cyanocobalamin (,VITAMIN B-12,) 1000 MCG/ML injection INJECT 1ML INTRAMUSCULAR ONCE WEEKLY FOR 3 WEEKS THEN ONCE A MONTH 7 mL 2  . diclofenac sodium (VOLTAREN) 1 % GEL Apply 4 g topically 4 (four) times daily. 1 Tube 1  . Incontinence Supply Disposable (DISPOSABLE BRIEF LARGE) MISC 1 application by Does not apply route 2 (two) times daily. 36 each 12  . insulin NPH Human (HUMULIN  N) 100 UNIT/ML injection INJECT 20 UNITS AT BEDTIME AND 5 UNITS THE THE MORNING UNLESS BG IS LESS THAN 150 (Patient taking differently: INJECT 10 UNITS AT BEDTIME AND 5 UNITS THE THE MORNING UNLESS BG IS LESS THAN 150) 10 mL 0  . lamoTRIgine (LAMICTAL) 25 MG tablet TAKE 2 TABLETS BY MOUTH AT BEDTIME (NEEDS  OFFICE  VISIT  PRIOR  TO  ANYMORE  REFILLS) 60 tablet 0  . lisinopril (PRINIVIL,ZESTRIL) 40 MG tablet TAKE 1 TABLET BY MOUTH DAILY 90 tablet 1  . metFORMIN (GLUCOPHAGE) 850 MG tablet TAKE ONE TABLET BY MOUTH TWICE DAILY WITH FOOD AS DIRECTED. 180 tablet 0  . PHARMACIST CHOICE LANCETS MISC USE 2 TIMES DAILY FOR DIABETIC TESTING 200 each 0  . QUEtiapine (SEROQUEL) 25 MG tablet Take 1  tablet (25 mg total) by mouth 2 (two) times daily. 90 tablet 0  . sertraline (ZOLOFT) 50 MG tablet Take 1 tablet (50 mg total) by mouth daily. 90 tablet 1  . Syringe/Needle, Disp, (SYRINGE 3CC/25GX1") 25G X 1" 3 ML MISC Use for b12 injections 50 each 0  . Syringe/Needle, Disp, (SYRINGE LUER LOCK) 25G X 1" 3 ML MISC 1 applicator by Does not apply route every 30 (thirty) days. 16 each 1  . traMADol (ULTRAM) 50 MG tablet Take 1 tablet (50 mg total) by mouth every 12 (twelve) hours as needed for severe pain. 30 tablet 0    Results for orders placed or performed during the hospital encounter of 10/12/18 (from the past 48 hour(s))  CBC with Differential     Status: Abnormal   Collection Time: 10/12/18  5:15 PM  Result Value Ref Range   WBC 13.8 (H) 4.0 - 10.5 K/uL   RBC 5.18 (H) 3.87 - 5.11 MIL/uL   Hemoglobin 14.1 12.0 - 15.0 g/dL   HCT 45.4 36.0 - 46.0 %   MCV 87.6 80.0 - 100.0 fL   MCH 27.2 26.0 - 34.0 pg   MCHC 31.1 30.0 - 36.0 g/dL   RDW 16.0 (H) 11.5 - 15.5 %   Platelets 483 (H) 150 - 400 K/uL   nRBC 0.0 0.0 - 0.2 %   Neutrophils Relative % 76 %   Neutro Abs 10.6 (H) 1.7 - 7.7 K/uL   Lymphocytes Relative 14 %   Lymphs Abs 2.0 0.7 - 4.0 K/uL   Monocytes Relative 6 %   Monocytes Absolute 0.8 0.1 - 1.0 K/uL   Eosinophils Relative 1 %   Eosinophils Absolute 0.2 0.0 - 0.5 K/uL   Basophils Relative 1 %   Basophils Absolute 0.1 0.0 - 0.1 K/uL   Immature Granulocytes 2 %   Abs Immature Granulocytes 0.21 (H) 0.00 - 0.07 K/uL    Comment: Performed at [facility], [address] [place], [place] [address]  Comprehensive metabolic panel     Status: Abnormal   Collection Time: 10/12/18  5:15 PM  Result Value Ref Range   Sodium 137 135 - 145 mmol/L   Potassium 5.7 (H) 3.5 - 5.1 mmol/L   Chloride 108 98 - 111 mmol/L   CO2 18 (L) 22 - 32 mmol/L   Glucose, Bld 224 (H) 70 - 99 mg/dL   BUN 94 (H) 8 - 23 mg/dL   Creatinine, Ser 1.78 (H) 0.44 - 1.00 mg/dL   Calcium 10.2 8.9 - 10.3  mg/dL   Total Protein 7.7 6.5 - 8.1 g/dL   Albumin 2.5 (L) 3.5 - 5.0 g/dL   AST 27 15 - 41 U/L  ALT 25 0 - 44 U/L   Alkaline Phosphatase 114 38 - 126 U/L   Total Bilirubin 0.5 0.3 - 1.2 mg/dL   GFR calc non Af Amer 25 (L) >60 mL/min   GFR calc Af Amer 29 (L) >60 mL/min   Anion gap 11 5 - 15    Comment: Performed at [facility], [address] [place], [place] [address]  Troponin I - ONCE - STAT     Status: None   Collection Time: 10/12/18  5:15 PM  Result Value Ref Range   Troponin I <0.03 <0.03 ng/mL    Comment: Performed at [facility], [address] [place], [place] [address]  Urinalysis, Complete w Microscopic     Status: Abnormal   Collection Time: 10/12/18  6:10 PM  Result Value Ref Range   Color, Urine AMBER (A) YELLOW    Comment: BIOCHEMICALS MAY BE AFFECTED BY COLOR   APPearance CLOUDY (A) CLEAR   Specific Gravity, Urine 1.016 1.005 - 1.030   pH 7.0 5.0 - 8.0   Glucose, UA NEGATIVE NEGATIVE mg/dL   Hgb urine dipstick LARGE (A) NEGATIVE   Bilirubin Urine NEGATIVE NEGATIVE   Ketones, ur NEGATIVE NEGATIVE mg/dL   Protein, ur 30 (A) NEGATIVE mg/dL   Nitrite NEGATIVE NEGATIVE   Leukocytes,Ua SMALL (A) NEGATIVE   RBC / HPF 21-50 0 - 5 RBC/hpf   WBC, UA 21-50 0 - 5 WBC/hpf   Bacteria, UA RARE (A) NONE SEEN   Squamous Epithelial / LPF 0-5 0 - 5   Mucus PRESENT    Budding Yeast PRESENT    Hyaline Casts, UA PRESENT    Non Squamous Epithelial PRESENT (A) NONE SEEN    Comment: Performed at [facility], [address] [place], [place] [address]  Basic metabolic panel     Status: Abnormal   Collection Time: 10/12/18  8:19 PM  Result Value Ref Range   Sodium 138 135 - 145 mmol/L   Potassium 5.1 3.5 - 5.1 mmol/L   Chloride 111 98 - 111 mmol/L   CO2 15 (L) 22 - 32 mmol/L   Glucose, Bld 159 (H) 70 - 99 mg/dL   BUN 94 (H) 8 - 23 mg/dL   Creatinine, Ser 1.77 (H) 0.44 - 1.00 mg/dL   Calcium 9.5 8.9 - 10.3 mg/dL   GFR calc non Af Amer 26 (L)  >60 mL/min   GFR calc Af Amer 30 (L) >60 mL/min   Anion gap 12 5 - 15    Comment: Performed at [facility], [address] [place], [place] [address]   Ct Head Wo Contrast  Result Date: 10/12/2018 CLINICAL DATA:  Muscle weakness. EXAM: CT HEAD WITHOUT CONTRAST TECHNIQUE: Contiguous axial images were obtained from the base of the skull through the vertex without intravenous contrast. COMPARISON:  CT scan of February 25, 2012. FINDINGS: Brain: Mild diffuse cortical atrophy is noted. Mild chronic ischemic white matter disease is noted. No mass effect or midline shift is noted. Ventricular size is within normal limits. There is no evidence of mass lesion, hemorrhage or acute infarction. Vascular: No hyperdense vessel or unexpected calcification. Skull: Normal. Negative for fracture or focal lesion. Sinuses/Orbits: No acute finding. Other: None. IMPRESSION: Mild diffuse cortical atrophy. Mild chronic ischemic white matter disease. No acute intracranial abnormality seen. Electronically Signed   By: Marijo Conception M.D.   On: 10/12/2018 18:29   Dg Chest Portable 1 View  Result Date: 10/12/2018 CLINICAL DATA:  Weakness. EXAM: PORTABLE CHEST 1 VIEW COMPARISON:  Radiographs of March 22, 2012. FINDINGS: The heart size and mediastinal contours are within normal limits. Both lungs are clear. No pneumothorax or pleural effusion is noted. The visualized skeletal structures are unremarkable. IMPRESSION: No active disease. Electronically Signed   By: Marijo Conception M.D.   On: 10/12/2018 17:48    Review of Systems  Constitutional: Negative for chills and fever.  HENT: Negative for sore throat and tinnitus.   Eyes: Negative for blurred vision and redness.  Respiratory: Negative for cough and shortness of breath.   Cardiovascular: Negative for chest pain, palpitations, orthopnea and PND.  Gastrointestinal: Negative for abdominal pain, diarrhea, nausea and vomiting.  Genitourinary: Negative for  dysuria, frequency and urgency.  Musculoskeletal: Negative for joint pain and myalgias.  Skin: Negative for rash.       No lesions  Neurological: Positive for weakness. Negative for speech change and focal weakness.  Endo/Heme/Allergies: Does not bruise/bleed easily.       No temperature intolerance  Psychiatric/Behavioral: Negative for depression and suicidal ideas.    Blood pressure (!) 91/52, pulse 61, temperature (!) 97.5 F (36.4 C), temperature source Oral, resp. rate 15, height '5\' 7"'  (1.702 m), weight 78.9 kg, SpO2 96 %. Physical Exam  Vitals reviewed. Constitutional: She is oriented to person, place, and time. She appears well-developed and well-nourished. No distress.  HENT:  Head: Normocephalic and atraumatic.  Mouth/Throat: Oropharynx is clear and moist.  Eyes: Pupils are equal, round, and reactive to light. Conjunctivae and EOM are normal. No scleral icterus.  Neck: Normal range of motion. Neck supple. No JVD present. No tracheal deviation present. No thyromegaly present.  Cardiovascular: Normal rate, regular rhythm and normal heart sounds. Exam reveals no gallop and no friction rub.  No murmur heard. Respiratory: Effort normal and breath sounds normal. No respiratory distress.  GI: Soft. Bowel sounds are normal. She exhibits no distension. There is no abdominal tenderness.  Genitourinary:    Genitourinary Comments: Deferred   Musculoskeletal: Normal range of motion.        General: No edema.  Lymphadenopathy:    She has no cervical adenopathy.  Neurological: She is alert and oriented to person, place, and time. No cranial nerve deficit. She exhibits normal muscle tone.  Skin: Skin is warm and dry. No rash noted. No erythema.  Psychiatric: She has a normal mood and affect. Her behavior is normal. Judgment and thought content normal.     Assessment/Plan This is an 85 year old female admitted for generalized weakness. 1.  Weakness: Generalized; the patient has become  progressively weaker lately.  This is multifactorial including pain in her knees due to osteoarthritis.  She has not been able to walk unassisted for quite some time.  She also reports decreased appetite.  No doubt dehydration and chronic kidney disease contributes to her weakness.  Hydrate with intravenous fluid.  Encourage p.o. intake. 2.  CKD: Stage IV; avoid nephrotoxic agents.  Hydrate with intravenous fluid. 3.  Hypertension: Controlled.  The patient's blood pressure is on the soft side.  She is received 2 L of fluid.  Continue normal saline as needed for low blood pressures.  Hold atenolol 4.  UTI: Present on admission.  Uncomplicated; the patient has received fosfomycin in the ED.  Monitor for signs or symptoms of sepsis 5.  Diabetes mellitus type 2: Previous A1c 6.5; hold oral hypoglycemic agents.  Sliding-scale insulin while hospitalized 6.  DVT prophylaxis: Heparin 7.  GI prophylaxis: None  The patient is a full code.  Time spent on admission orders and patient care approximately 45 minutes  Harrie Foreman, MD 10/13/2018, 3:28 AM

## 2018-10-13 NOTE — NC FL2 (Signed)
[place] LEVEL OF CARE SCREENING TOOL     IDENTIFICATION  Patient Name: Monica [name] Birthdate: Aug 09, 1929 Sex: female Admission Date (Current Location): 10/12/2018  [place] and [place] Number:  Engineering geologist and Address:  [facility], [address] [place], [place] [address]      Provider Number: [id number]  Attending Physician Name and Address:  Henreitta Leber, MD  Relative Name and Phone Number:  Silvio Pate, grand daughter [phone]    Current Level of Care: Hospital Recommended Level of Care: [place] Prior Approval Number:    Date Approved/Denied: 10/10/10 PASRR Number: [id number]  Discharge Plan: SNF    Current Diagnoses: Patient Active Problem List   Diagnosis Date Noted  . Weakness 10/13/2018  . Pressure injury of skin 10/13/2018  . Spinal stenosis of lumbar region 10/06/2018  . Fecal incontinence 10/06/2018  . Type 2 diabetes mellitus with diabetic nephropathy, with long-term current use of insulin ([place]) 09/27/2018  . CKD stage 3 due to type 2 diabetes mellitus ([place]) 09/27/2018  . Delirium due to another medical condition, acute, hyperactive 09/27/2018  . Impaired ambulation 09/27/2018  . Recurrent falls 06/11/2017  . Dystrophic nail 11/13/2016  . Allergic rhinitis 09/30/2015  . Major depressive disorder, recurrent episode with anxious distress ([place]) 10/10/2013  . Vitamin D deficiency 10/08/2013  . Bilateral chronic knee pain 09/21/2011  . Tobacco abuse counseling 07/08/2011  . Incontinence of urine 06/13/2011  . History of tobacco abuse 06/11/2011  . Hyperlipidemia   . Hypertension   . COPD (chronic obstructive pulmonary disease) ([place])   . B12 deficiency   . Lung mass     Orientation RESPIRATION BLADDER Height & Weight     Self  Normal Incontinent Weight: 77 kg Height:  5\' 7"  (170.2 cm)  BEHAVIORAL SYMPTOMS/MOOD NEUROLOGICAL BOWEL NUTRITION STATUS      Incontinent Diet  AMBULATORY  STATUS COMMUNICATION OF NEEDS Skin   Extensive Assist Verbally Skin abrasions, Other (Comment)(Wound type:)                       Personal Care Assistance Level of Assistance  Total care       Total Care Assistance: Limited assistance   Functional Limitations Info  Sight, Hearing, Speech Sight Info: Adequate Hearing Info: Adequate Speech Info: Adequate    SPECIAL CARE FACTORS FREQUENCY  PT (By licensed PT), OT (By licensed OT)     PT Frequency: 5 times per week OT Frequency: 5 times per week            Contractures Contractures Info: Not present    Additional Factors Info  Code Status Code Status Info: full             Current Medications (10/13/2018):  This is the current hospital active medication list Current Facility-Administered Medications  Medication Dose Route Frequency Provider Last Rate Last Dose  . 0.9 %  sodium chloride infusion   Intravenous Continuous Harrie Foreman, MD 125 mL/hr at 10/13/18 1047    . acetaminophen (TYLENOL) tablet 650 mg  650 mg Oral Q6H PRN Harrie Foreman, MD   650 mg at 10/13/18 0459   Or  . acetaminophen (TYLENOL) suppository 650 mg  650 mg Rectal Q6H PRN Harrie Foreman, MD      . cefTRIAXone (ROCEPHIN) 1 g in sodium chloride 0.9 % 100 mL IVPB  1 g Intravenous Daily Henreitta Leber, MD 200 mL/hr at 10/13/18 1609 1  g at 10/13/18 1609  . collagenase (SANTYL) ointment   Topical Daily Sainani, Belia Heman, MD      . docusate sodium (COLACE) capsule 100 mg  100 mg Oral BID Harrie Foreman, MD   100 mg at 10/13/18 0840  . heparin injection 5,000 Units  5,000 Units Subcutaneous Q8H Harrie Foreman, MD   5,000 Units at 10/13/18 1405  . insulin aspart (novoLOG) injection 0-15 Units  0-15 Units Subcutaneous TID WC Harrie Foreman, MD   2 Units at 10/13/18 1217  . insulin aspart (novoLOG) injection 0-5 Units  0-5 Units Subcutaneous QHS Harrie Foreman, MD      . lamoTRIgine (LAMICTAL) tablet 50 mg  50 mg Oral QHS  Sainani, Belia Heman, MD      . multivitamin-lutein (OCUVITE-LUTEIN) capsule 1 capsule  1 capsule Oral Daily Henreitta Leber, MD      . ondansetron [facility]) tablet 4 mg  4 mg Oral Q6H PRN Harrie Foreman, MD       Or  . ondansetron [facility]) injection 4 mg  4 mg Intravenous Q6H PRN Harrie Foreman, MD      . protein supplement (ENSURE MAX) liquid  11 oz Oral BID BM Henreitta Leber, MD   11 oz at 10/13/18 1410  . QUEtiapine (SEROQUEL) tablet 25 mg  25 mg Oral BID Henreitta Leber, MD   25 mg at 10/13/18 1405  . sertraline (ZOLOFT) tablet 50 mg  50 mg Oral Daily Henreitta Leber, MD   50 mg at 10/13/18 1405     Discharge Medications: Please see discharge summary for a list of discharge medications.  Relevant Imaging Results:  Relevant Lab Results:   Additional Information 1. Unstageable pressure injury; sacrum Stage 1 pressure injury; left lateral ankle Deep tissue pressure injury: right medial heel Pressure Injury POA: Yes Sacrum: 11cm x 9cm x 0cm Right heel: 1cm x 1cm x 0cm  Left ankle: 2.5cm x 2cm x 0cm  Wound bed: [id number]     1. Unstageable pressure injury; sacrum)  Su Hilt, RN

## 2018-10-13 NOTE — Progress Notes (Signed)
Initial Nutrition Assessment  RD working remotely.  DOCUMENTATION CODES:   Not applicable  INTERVENTION:  Recommend liberalizing diet to carbohydrate modified.  Provide Ensure Max Protein po BID, each supplement provides 150 kcal and 30 grams of protein.  Provide Ocuvite daily for wound healing (provides zinc, vitamin A, vitamin C, Vitamin E, copper, and selenium).  Consider appetite stimulant.  NUTRITION DIAGNOSIS:   Increased nutrient needs related to wound healing as evidenced by estimated needs.  GOAL:   Patient will meet greater than or equal to 90% of their needs  MONITOR:   PO intake, Supplement acceptance, Labs, Weight trends, Skin, I & O's  REASON FOR ASSESSMENT:   Malnutrition Screening Tool, Consult Wound healing  ASSESSMENT:   85 year old female with PMHx of DM, HLD, HTN, COPD, CKD, vitamin B12 deficiency, Bell's palsy admitted with generalized weakness, CKD stage IV, HTN, UTI.   Patient too confused to provide history at this time. Per chart she had poor PO intake PTA. She ate 0% of her breakfast this morning. Per chart she has been less mobile the past 2 weeks. Patient is at risk for malnutrition. She has increased nutrient needs to support wound healing.  Medications reviewed and include: Colace 100 mg BID, Novolog 0-15 units TID, Novolog 0-5 units QHS, Seroquel, sertraline, NS @ 125 mL/hr.  Labs reviewed: CBG 150-182, CO2 15, BUN 94, Creatinine 1.77.  NUTRITION - FOCUSED PHYSICAL EXAM:  Unable to complete at this time.  Diet Order:   Diet Order            Diet heart healthy/carb modified Room service appropriate? Yes; Fluid consistency: Thin  Diet effective now             EDUCATION NEEDS:   Not appropriate for education at this time  Skin:  Skin Assessment: Skin Integrity Issues:(unstageable sacrum (11cm x 9cm x 0cm); stg I left lateral ankle (2.5 cm x 2cm x 0cm); DTI right medial heel (1cm x 1cm x 0cm))  Last BM:  10/13/2018 - large type  5  Height:   Ht Readings from Last 1 Encounters:  10/12/18 5\' 7"  (1.702 m)   Weight:   Wt Readings from Last 1 Encounters:  10/13/18 77 kg   Ideal Body Weight:  61.4 kg  BMI:  Body mass index is 26.59 kg/m.  Estimated Nutritional Needs:   Kcal:  9166-0600   Protein:  85-95 grams  Fluid:  1.6-1.8 L/day  Willey Blade, MS, RD, LDN Office: [phone] Pager: [phone] After Hours/Weekend Pager: [phone]

## 2018-10-13 NOTE — Evaluation (Signed)
Physical Therapy Evaluation Patient Details Name: Monica [name] MRN: [id number] DOB: May 26, 1930 Today's Date: 10/13/2018   History of Present Illness  85 y.o. female with a past medical history of COPD, diabetes, hypertension, hyperlipidemia, CKD, presents to the emergency department for generalized weakness.  Apparently she has been unable to stand/walk for ~2 weeks.  Clinical Impression  Attempted to see multiple times this AM, first 2 times she had soiled herself and needed cleaning.  On actual eval she was very functionally limited and was unable to even maintain sitting at EOB well enough to make a trial at standing safe (pt falling/leaning to the R t/o effort). She showed some effort with supine exercises but was confused and pain limited with most tasks.  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  Unsure of baseline status, but at this point pt is not at all safe to return home and will need STR once medically ready for d/c.    Follow Up Recommendations SNF    Equipment Recommendations  None recommended by PT(pt has walker at home)    Recommendations for Other Services       Precautions / Restrictions Precautions Precautions: Fall Restrictions Weight Bearing Restrictions: No      Mobility  Bed Mobility Overal bed mobility: Needs Assistance Bed Mobility: Supine to Sit;Sit to Supine     Supine to sit: Max assist Sit to supine: Max assist   General bed mobility comments: Pt leaning heavily to the R t/o the effort, could not maintain sitting w/o direct assist despite plenty of assisto square to EOB and cuing to use UEs  Transfers                 General transfer comment: unable to maintain sitting balance w/o assist, unsafe to try standing this date  Ambulation/Gait                Stairs            Wheelchair Mobility    Modified Rankin (Stroke Patients Only)        Balance Overall balance assessment: Needs assistance Sitting-balance support: Bilateral upper extremity supported;Feet supported Sitting balance-Leahy Scale: Poor Sitting balance - Comments: Pt falling to the R t/o the effort of sitting st EOB, needed direct hands on assist the entire time                                     Pertinent Vitals/Pain Pain Assessment: Faces Faces Pain Scale: Hurts even more Pain Location: showed discomfort with most mobility, did not rate pain despite questioning    Home Living Family/patient expects to be discharged to:: Skilled nursing facility Living Arrangements: Other relatives                    Prior Function Level of Independence: Needs assistance         Comments: Pt struggled to answer a lot of background questions, but indicated that until recently she could get around in the home with limited assist     Hand Dominance        Extremity/Trunk Assessment   Upper Extremity Assessment Upper Extremity Assessment: Generalized weakness    Lower Extremity Assessment Lower Extremity Assessment: Generalized weakness(b/l knees lack TKE, OA t/o joints)  Communication   Communication: No difficulties  Cognition Arousal/Alertness: Awake/alert Behavior During Therapy: Anxious Overall Cognitive Status: Difficult to assess                                        General Comments      Exercises General Exercises - Lower Extremity Ankle Circles/Pumps: PROM;5 reps Heel Slides: AAROM;5 reps Hip ABduction/ADduction: AROM;AAROM;5 reps Straight Leg Raises: AAROM;5 reps   Assessment/Plan    PT Assessment Patient needs continued PT services  PT Problem List Decreased strength;Decreased range of motion;Decreased activity tolerance;Decreased balance;Decreased mobility;Decreased coordination;Decreased cognition;Decreased knowledge of use of DME;Decreased safety awareness       PT Treatment  Interventions DME instruction;Gait training;Stair training;Functional mobility training;Therapeutic activities;Therapeutic exercise;Balance training;Neuromuscular re-education;Patient/family education;Cognitive remediation    PT Goals (Current goals can be found in the Care Plan section)  Acute Rehab PT Goals Patient Stated Goal: unable to state goals PT Goal Formulation: With patient Time For Goal Achievement: 10/27/18 Potential to Achieve Goals: Good    Frequency Min 2X/week   Barriers to discharge        Co-evaluation               AM-PAC PT "6 Clicks" Mobility  Outcome Measure Help needed turning from your back to your side while in a flat bed without using bedrails?: A Lot Help needed moving from lying on your back to sitting on the side of a flat bed without using bedrails?: A Lot Help needed moving to and from a bed to a chair (including a wheelchair)?: Total Help needed standing up from a chair using your arms (e.g., wheelchair or bedside chair)?: Total Help needed to walk in hospital room?: Total Help needed climbing 3-5 steps with a railing? : Total 6 Click Score: 8    End of Session Equipment Utilized During Treatment: Gait belt Activity Tolerance: Patient limited by fatigue Patient left: with bed alarm set;with call bell/phone within reach Nurse Communication: Mobility status PT Visit Diagnosis: Muscle weakness (generalized) (M62.81);Difficulty in walking, not elsewhere classified (R26.2)    Time: 0109-3235 PT Time Calculation (min) (ACUTE ONLY): 24 min   Charges:   PT Evaluation $PT Eval Low Complexity: 1 Low PT Treatments $Therapeutic Exercise: 8-22 mins        Kreg Shropshire, DPT 10/13/2018, 1:10 PM

## 2018-10-13 NOTE — TOC Initial Note (Signed)
Transition of Care [facility]) - Initial/Assessment Note    Patient Details  Name: Monica [name] MRN: [id number] Date of Birth: 10/11/1929  Transition of Care [facility]) CM/SW Contact:    Su Hilt, RN Phone Number: 10/13/2018, 4:06 PM  Clinical Narrative:                  Damaris Schooner to Regan the patients [place] daughter, The patient lives with the spouse, the daughter and the grand daughter, the grand daughter takes care of all of the medical issues, she stated that the patient fell in Jan and since then has not been able to walk, she stated that the patient moved around in the bed and was able to get in the wheelchair at home, she was not confused Her provided increased her Zoloft to 100 mg daily and the patient started Hallucinating, she was seen by the provider again and they decreased the dose back to 50 mg and the patient was found to have a UTI The patient was treated with ABX at home but remained confused and for the last 2 weeks has been able to sit up at all, The provider wanted the patient seen by Neurology to see if the new symptoms of not being able to sit up at all could be related to her back injury from Jan. and made appointment but earliest appointment was for May, the patient developed sacral ulcers and the provided encouraged the family to bring her to the hospital, Wound care evaluated the wound Wound type: 1. Unstageable pressure injury; sacrum 2. Stage 1 pressure injury; left lateral ankle 3. Deep tissue pressure injury: right medial heel Pressure Injury POA: Yes Measurement: 1. Sacrum: 11cm x 9cm x 0cm 2. Right heel: 1cm x 1cm x 0cm  3. Left ankle: 2.5cm x 2cm x 0cm  Wound bed: 1. Sacrum; 75% black eschar/25% dark purple/non blanchable 2. Right heel: 100% dark purple non blanchable tissue; intact skin 3. Left ankle: reddened; non blanchable intact skin   Drainage (amount, consistency, odor) none per nursing flowsheet  Periwound: intact, some mild MARSI (medical adhesive  related skin injury) right lateral area of the sacral wound I explained that PT saw the patient and recommends rehab, she agrees to start a bedsearch for rehab     Expected Discharge Plan: Skilled Nursing Facility Barriers to Discharge: Continued Medical Work up   Patient Goals and CMS Choice Patient states their goals for this hospitalization and ongoing recovery are:: [place] Daughter says goal to get up and sit up again, get less confused      Expected Discharge Plan and Services Expected Discharge Plan: [place]   Discharge Planning Services: CM Consult   Living arrangements for the past 2 months: Single Family Home Expected Discharge Date: 10/15/18                        Prior Living Arrangements/Services Living arrangements for the past 2 months: Single Family Home Lives with:: Adult Children, Spouse, Relatives Patient language and need for interpreter reviewed:: No Do you feel safe going back to the place where you live?: Yes      Need for Family Participation in Patient Care: Yes (Comment) Care giver support system in place?: Yes (comment) Current home services: DME(wheelchair at home) Criminal Activity/Legal Involvement Pertinent to Current Situation/Hospitalization: No - Comment as needed  Activities of Daily Living Home Assistive Devices/Equipment: Shower chair with back ADL Screening (condition at time of admission) Patient's  cognitive ability adequate to safely complete daily activities?: No Is the patient deaf or have difficulty hearing?: No Does the patient have difficulty seeing, even when wearing glasses/contacts?: No Does the patient have difficulty concentrating, remembering, or making decisions?: Yes Patient able to express need for assistance with ADLs?: Yes Does the patient have difficulty dressing or bathing?: Yes(pt states she's been doing this herself) Independently performs ADLs?: No Communication: Independent Dressing (OT):  Independent Grooming: Independent Feeding: Independent Bathing: Independent Toileting: Independent In/Out Bed: Independent Walks in Home: Independent Does the patient have difficulty walking or climbing stairs?: Yes Weakness of Legs: Both Weakness of Arms/Hands: None  Permission Sought/Granted Permission sought to share information with : Case Manager, Chartered certified accountant granted to share information with : Yes, Verbal Permission Granted     Permission granted to share info w AGENCY: Any SNF        Emotional Assessment Appearance:: Appears stated age Attitude/Demeanor/Rapport: Engaged Affect (typically observed): Accepting Orientation: : Oriented to Self Alcohol / Substance Use: Never Used Psych Involvement: No (comment)  Admission diagnosis:  Weakness [R53.1] Patient Active Problem List   Diagnosis Date Noted  . Weakness 10/13/2018  . Pressure injury of skin 10/13/2018  . Spinal stenosis of lumbar region 10/06/2018  . Fecal incontinence 10/06/2018  . Type 2 diabetes mellitus with diabetic nephropathy, with long-term current use of insulin ([place]) 09/27/2018  . CKD stage 3 due to type 2 diabetes mellitus ([place]) 09/27/2018  . Delirium due to another medical condition, acute, hyperactive 09/27/2018  . Impaired ambulation 09/27/2018  . Recurrent falls 06/11/2017  . Dystrophic nail 11/13/2016  . Allergic rhinitis 09/30/2015  . Major depressive disorder, recurrent episode with anxious distress ([place]) 10/10/2013  . Vitamin D deficiency 10/08/2013  . Bilateral chronic knee pain 09/21/2011  . Tobacco abuse counseling 07/08/2011  . Incontinence of urine 06/13/2011  . History of tobacco abuse 06/11/2011  . Hyperlipidemia   . Hypertension   . COPD (chronic obstructive pulmonary disease) ([place])   . B12 deficiency   . Lung mass    PCP:  [name], MD Pharmacy:   [id number] [place], [address] [place] [address] Phone:  [phone] Fax: [phone]  [place] [address] [place] [address] Phone: [phone] Fax: [phone]     Social Determinants of Health (SDOH) Interventions    Readmission Risk Interventions No flowsheet data found.

## 2018-10-13 NOTE — Consult Note (Signed)
[facility] Nurse wound consult note Consultation was completed by review of records, images and assistance from the bedside nurse/clinical staff.   Reason for Consult: pressure injuries sacrum, ankle and heel. Patient admitted with decreased mobility and weakness Wound type: 1. Unstageable pressure injury; sacrum 2. Stage 1 pressure injury; left lateral ankle 3. Deep tissue pressure injury: right medial heel Pressure Injury POA: Yes Measurement: 1. Sacrum: 11cm x 9cm x 0cm 2. Right heel: 1cm x 1cm x 0cm  3. Left ankle: 2.5cm x 2cm x 0cm  Wound bed: 1. Sacrum; 75% black eschar/25% dark purple/non blanchable 2. Right heel: 100% dark purple non blanchable tissue; intact skin 3. Left ankle: reddened; non blanchable intact skin   Drainage (amount, consistency, odor) none per nursing flowsheet  Periwound: intact, some mild MARSI (medical adhesive related skin injury) right lateral area of the sacral wound Dressing procedure/placement/frequency: 1. Add enzymatic debridement ointment to the sacral wound. Ideally would have general surgery look at this wound for surgical debridement due to current levels of necrotic tissue  2. Offload heels at all times when in the bed; Prevalon boots ordered.  3. Pressure redistribution and moisture management with low air loss mattress; requested bed to be ordered  4. Maximize nutrition for wound healing; consult to RD  5. Mobilize as possible; PT order in place  6. Soft silicone foam to protect ankle and heel wound, change every 3 days.   Discussed POC with bedside nurse.  Requested hospitalist to consult surgery if surgical debridement is feasible with patient's current comorbid conditions.  Re consult if needed, will not follow at this time. Thanks  Lennyn Bellanca [employer], RN,CWOCN, CNS, [place] [phone])

## 2018-10-13 NOTE — Progress Notes (Signed)
Pt has poor PO intake. Ate 4 bites of food total for RN. Then said she was finished. 3 min later pt spit the food out. RN asked why. Pt stated the took the bites just to please RN. RN educated pt on nutrition needs. Will continue to monitor.

## 2018-10-13 NOTE — Consult Note (Signed)
Patient ID: KIMMBERLY [name], female   DOB: July 02, 1929, 85 y.o.   MRN: [id number]  HPI Monica [name] is a 85 y.o. female with multiple issues including failure to thrive, delirium, spinal stenosis. Around January she started having issues with her back and was diagnosed w spinla stenosis. THis progressively limited her mobility and for the last 2 weeks she has been unable to get ub and is mainly in bed. Hx take from granddaughter over the phone. She tells me that Mrs Monica [name] has been hallucinating forr the last 2 weeks. She was placed on higher dose SSRI and the dose was adjusted. She came in for generalized weakness and failure to thrive and was found to have a sacral decubitus and UTI. NO fevers or chills. Pt in confused and hx take from granddaughter. CXR and CT head pers. Reviewed no acute abnormalities  She did have some lunch today   HPI  Past Medical History:  Diagnosis Date  . B12 deficiency   . COPD (chronic obstructive pulmonary disease) ([place])   . Diabetes mellitus   . H/O: Bell's palsy   . Hyperlipidemia   . Hypertension   . Lung mass April 2011   s/p biopsy, no cancer cells found, Repeat CT Dec 2012 unchanged    Past Surgical History:  Procedure Laterality Date  . ABDOMINAL HYSTERECTOMY    . CHOLECYSTECTOMY    . LUNG BIOPSY  2012   for positive PET, negative biopsy for malignancy  . SPINE SURGERY  2001   Lumbar    Family History  Problem Relation Age of Onset  . Cancer Mother 81       Breast  . Diabetes Mother   . Heart disease Father 31       Acute MI  . Heart disease Sister   . Arthritis Sister   . Heart disease Brother     Social History Social History   Tobacco Use  . Smoking status: Former Smoker    Packs/day: 0.50    Years: 5.00    Pack years: 2.50    Types: Cigarettes  . Smokeless tobacco: Never Used  . Tobacco comment: quit approx 2016  Substance Use Topics  . Alcohol use: No  . Drug use: No    No Known Allergies  Current Facility-Administered  Medications  Medication Dose Route Frequency Provider Last Rate Last Dose  . 0.9 %  sodium chloride infusion   Intravenous Continuous Harrie Foreman, MD 125 mL/hr at 10/13/18 1047    . acetaminophen (TYLENOL) tablet 650 mg  650 mg Oral Q6H PRN Harrie Foreman, MD   650 mg at 10/13/18 0459   Or  . acetaminophen (TYLENOL) suppository 650 mg  650 mg Rectal Q6H PRN Harrie Foreman, MD      . cefTRIAXone (ROCEPHIN) 1 g in sodium chloride 0.9 % 100 mL IVPB  1 g Intravenous Daily Henreitta Leber, MD 200 mL/hr at 10/13/18 1639    . collagenase (SANTYL) ointment   Topical Daily Sainani, Belia Heman, MD      . docusate sodium (COLACE) capsule 100 mg  100 mg Oral BID Harrie Foreman, MD   100 mg at 10/13/18 0840  . heparin injection 5,000 Units  5,000 Units Subcutaneous Q8H Harrie Foreman, MD   5,000 Units at 10/13/18 1405  . insulin aspart (novoLOG) injection 0-15 Units  0-15 Units Subcutaneous TID WC Harrie Foreman, MD   2 Units at 10/13/18 1649  . insulin aspart (  novoLOG) injection 0-5 Units  0-5 Units Subcutaneous QHS Harrie Foreman, MD      . lamoTRIgine (LAMICTAL) tablet 50 mg  50 mg Oral QHS Sainani, Belia Heman, MD      . multivitamin-lutein (OCUVITE-LUTEIN) capsule 1 capsule  1 capsule Oral Daily Henreitta Leber, MD      . ondansetron [facility]) tablet 4 mg  4 mg Oral Q6H PRN Harrie Foreman, MD       Or  . ondansetron [facility]) injection 4 mg  4 mg Intravenous Q6H PRN Harrie Foreman, MD      . protein supplement (ENSURE MAX) liquid  11 oz Oral BID BM Henreitta Leber, MD   11 oz at 10/13/18 1410  . QUEtiapine (SEROQUEL) tablet 25 mg  25 mg Oral BID Henreitta Leber, MD   25 mg at 10/13/18 1405  . sertraline (ZOLOFT) tablet 50 mg  50 mg Oral Daily Henreitta Leber, MD   50 mg at 10/13/18 1405     Review of Systems Full ROS  was asked and was negative except for the information on the HPI  Physical Exam Blood pressure (!) 106/58, pulse 68, temperature 98 F (36.7 C),  temperature source Oral, resp. rate 16, height 5\' 7"  (1.702 m), weight 77 kg, SpO2 97 %. CONSTITUTIONAL: NAd , non toxic EYES: Pupils are equal, round, and reactive to light, Sclera are non-icteric. EARS, NOSE, MOUTH AND THROAT: The oropharynx is clear. The oral mucosa is pink and moist. Hearing is intact to voice. LYMPH NODES:  Lymph nodes in the neck are normal. RESPIRATORY:  Lungs are clear. There is normal respiratory effort, with equal breath sounds bilaterally, and without pathologic use of accessory muscles. CARDIOVASCULAR: Heart is regular without murmurs, gallops, or rubs. GI: The abdomen is  soft, nontender, and nondistended. There are no palpable masses. There is no hepatosplenomegaly. There are normal bowel sounds in all quadrants. GU: Rectal deferred.   MUSCULOSKELETAL: Normal muscle strength and tone. No cyanosis or edema.   SKIN: Sacral decubitus ulcer measures 6 cms with necrotic skin and erythema around edges.  PSYCH:  Disoriented to place, oriented to person, no focal deficits but generalized weakness.   Data Reviewed  I have personally reviewed the patient's imaging, laboratory findings and medical records.    Assessment/Plan 85 yo debilitated female w sacral decubitus. From a surgical stand point it will need to be debrided,.. I discussed with granddaughter about the situation in detail . They are not sure if they want to pursue surgical debridement. I did explain to them about the proposed procedure, risks , benefits and possible complications. She understands and wishes to talk with family members. There is no evidence of necrotizing infection that would mandate emergent surgical intervention at this time.    Caroleen Hamman, MD FACS General Surgeon 10/13/2018, 4:55 PM

## 2018-10-13 NOTE — Progress Notes (Signed)
PT Cancellation Note  Patient Details Name: Monica [name] MRN: [id number] DOB: 09-07-1929   Cancelled Treatment:    Reason Eval/Treat Not Completed: Patient not medically ready Attempted to see pt X 2 this AM, each time she was soiled with BM and needing cleaned up.  Nursing notified, will try back later as time and situation allow.  Kreg Shropshire, DPT 10/13/2018, 9:48 AM

## 2018-10-13 NOTE — Care Management Obs Status (Signed)
[facility] NOTIFICATION   Patient Details  Name: Monica [name] MRN: [id number] Date of Birth: 27-Jun-1929   Medicare Observation Status Notification Given:  Yes    Elza Rafter, RN 10/13/2018, 3:29 PM

## 2018-10-14 ENCOUNTER — Inpatient Hospital Stay: Payer: Medicare Other

## 2018-10-14 DIAGNOSIS — J449 Chronic obstructive pulmonary disease, unspecified: Secondary | ICD-10-CM | POA: Diagnosis present

## 2018-10-14 DIAGNOSIS — E860 Dehydration: Secondary | ICD-10-CM | POA: Diagnosis present

## 2018-10-14 DIAGNOSIS — I96 Gangrene, not elsewhere classified: Secondary | ICD-10-CM | POA: Diagnosis present

## 2018-10-14 DIAGNOSIS — E1169 Type 2 diabetes mellitus with other specified complication: Secondary | ICD-10-CM | POA: Diagnosis present

## 2018-10-14 DIAGNOSIS — Z87891 Personal history of nicotine dependence: Secondary | ICD-10-CM | POA: Diagnosis not present

## 2018-10-14 DIAGNOSIS — E1122 Type 2 diabetes mellitus with diabetic chronic kidney disease: Secondary | ICD-10-CM | POA: Diagnosis present

## 2018-10-14 DIAGNOSIS — R531 Weakness: Secondary | ICD-10-CM

## 2018-10-14 DIAGNOSIS — L98423 Non-pressure chronic ulcer of back with necrosis of muscle: Secondary | ICD-10-CM | POA: Diagnosis not present

## 2018-10-14 DIAGNOSIS — E785 Hyperlipidemia, unspecified: Secondary | ICD-10-CM | POA: Diagnosis present

## 2018-10-14 DIAGNOSIS — Z8249 Family history of ischemic heart disease and other diseases of the circulatory system: Secondary | ICD-10-CM | POA: Diagnosis not present

## 2018-10-14 DIAGNOSIS — L89154 Pressure ulcer of sacral region, stage 4: Secondary | ICD-10-CM | POA: Diagnosis present

## 2018-10-14 DIAGNOSIS — I129 Hypertensive chronic kidney disease with stage 1 through stage 4 chronic kidney disease, or unspecified chronic kidney disease: Secondary | ICD-10-CM | POA: Diagnosis present

## 2018-10-14 DIAGNOSIS — N390 Urinary tract infection, site not specified: Secondary | ICD-10-CM | POA: Diagnosis present

## 2018-10-14 DIAGNOSIS — Z1159 Encounter for screening for other viral diseases: Secondary | ICD-10-CM | POA: Diagnosis not present

## 2018-10-14 DIAGNOSIS — Z833 Family history of diabetes mellitus: Secondary | ICD-10-CM | POA: Diagnosis not present

## 2018-10-14 DIAGNOSIS — Z79899 Other long term (current) drug therapy: Secondary | ICD-10-CM | POA: Diagnosis not present

## 2018-10-14 DIAGNOSIS — Z7189 Other specified counseling: Secondary | ICD-10-CM | POA: Diagnosis not present

## 2018-10-14 DIAGNOSIS — F339 Major depressive disorder, recurrent, unspecified: Secondary | ICD-10-CM | POA: Diagnosis present

## 2018-10-14 DIAGNOSIS — N184 Chronic kidney disease, stage 4 (severe): Secondary | ICD-10-CM | POA: Diagnosis present

## 2018-10-14 DIAGNOSIS — E1151 Type 2 diabetes mellitus with diabetic peripheral angiopathy without gangrene: Secondary | ICD-10-CM | POA: Diagnosis present

## 2018-10-14 DIAGNOSIS — M4800 Spinal stenosis, site unspecified: Secondary | ICD-10-CM | POA: Diagnosis present

## 2018-10-14 DIAGNOSIS — L89153 Pressure ulcer of sacral region, stage 3: Secondary | ICD-10-CM | POA: Diagnosis not present

## 2018-10-14 DIAGNOSIS — Z8261 Family history of arthritis: Secondary | ICD-10-CM | POA: Diagnosis not present

## 2018-10-14 DIAGNOSIS — Z515 Encounter for palliative care: Secondary | ICD-10-CM

## 2018-10-14 DIAGNOSIS — R627 Adult failure to thrive: Secondary | ICD-10-CM | POA: Diagnosis present

## 2018-10-14 DIAGNOSIS — Z794 Long term (current) use of insulin: Secondary | ICD-10-CM | POA: Diagnosis not present

## 2018-10-14 DIAGNOSIS — M4628 Osteomyelitis of vertebra, sacral and sacrococcygeal region: Secondary | ICD-10-CM | POA: Diagnosis present

## 2018-10-14 DIAGNOSIS — Z6826 Body mass index (BMI) 26.0-26.9, adult: Secondary | ICD-10-CM | POA: Diagnosis not present

## 2018-10-14 DIAGNOSIS — Z803 Family history of malignant neoplasm of breast: Secondary | ICD-10-CM | POA: Diagnosis not present

## 2018-10-14 LAB — COMPREHENSIVE METABOLIC PANEL
ALT: 22 U/L (ref 0–44)
AST: 17 U/L (ref 15–41)
Albumin: 2.3 g/dL — ABNORMAL LOW (ref 3.5–5.0)
Alkaline Phosphatase: 93 U/L (ref 38–126)
Anion gap: 8 (ref 5–15)
BUN: 59 mg/dL — ABNORMAL HIGH (ref 8–23)
CO2: 17 mmol/L — ABNORMAL LOW (ref 22–32)
Calcium: 9.1 mg/dL (ref 8.9–10.3)
Chloride: 118 mmol/L — ABNORMAL HIGH (ref 98–111)
Creatinine, Ser: 1.27 mg/dL — ABNORMAL HIGH (ref 0.44–1.00)
GFR calc Af Amer: 44 mL/min — ABNORMAL LOW (ref >60)
GFR calc non Af Amer: 38 mL/min — ABNORMAL LOW (ref >60)
Glucose, Bld: 150 mg/dL — ABNORMAL HIGH (ref 70–99)
Potassium: 4.3 mmol/L (ref 3.5–5.1)
Sodium: 143 mmol/L (ref 135–145)
Total Bilirubin: 0.7 mg/dL (ref 0.3–1.2)
Total Protein: 6.5 g/dL (ref 6.5–8.1)

## 2018-10-14 LAB — HEMOGLOBIN: Hemoglobin: 12 g/dL (ref 12.0–15.0)

## 2018-10-14 LAB — CBC
HCT: 39 % (ref 36.0–46.0)
Hemoglobin: 11.8 g/dL — ABNORMAL LOW (ref 12.0–15.0)
MCH: 27.4 pg (ref 26.0–34.0)
MCHC: 30.3 g/dL (ref 30.0–36.0)
MCV: 90.5 fL (ref 80.0–100.0)
Platelets: 351 10*3/uL (ref 150–400)
RBC: 4.31 MIL/uL (ref 3.87–5.11)
RDW: 15.9 % — ABNORMAL HIGH (ref 11.5–15.5)
WBC: 11.2 10*3/uL — ABNORMAL HIGH (ref 4.0–10.5)
nRBC: 0 % (ref 0.0–0.2)

## 2018-10-14 LAB — GLUCOSE, CAPILLARY
Glucose-Capillary: 137 mg/dL — ABNORMAL HIGH (ref 70–99)
Glucose-Capillary: 140 mg/dL — ABNORMAL HIGH (ref 70–99)
Glucose-Capillary: 105 mg/dL — ABNORMAL HIGH (ref 70–99)
Glucose-Capillary: 104 mg/dL — ABNORMAL HIGH (ref 70–99)

## 2018-10-14 LAB — PROTIME-INR
INR: 1.2 (ref 0.8–1.2)
Prothrombin Time: 15.3 seconds — ABNORMAL HIGH (ref 11.4–15.2)

## 2018-10-14 LAB — APTT: aPTT: 35 seconds (ref 24–36)

## 2018-10-14 NOTE — Progress Notes (Signed)
[facility] [place] NAME: Monica [name]    MR#:  [id number]  DATE OF BIRTH:  01/03/1930  SUBJECTIVE:   No acute events overnight.  Patient denies any significant pain presently.  This afternoon patient had an episode of coffee-ground emesis and was nauseated.  REVIEW OF SYSTEMS:    Review of Systems  Constitutional: Negative for chills and fever.  HENT: Negative for congestion and tinnitus.   Eyes: Negative for blurred vision and double vision.  Respiratory: Negative for cough, shortness of breath and wheezing.   Cardiovascular: Negative for chest pain, orthopnea and PND.  Gastrointestinal: Positive for nausea and vomiting. Negative for abdominal pain and diarrhea.  Genitourinary: Negative for dysuria and hematuria.  Neurological: Positive for weakness (generalized. ). Negative for dizziness, sensory change and focal weakness.  All other systems reviewed and are negative.   Nutrition: Heart Healthy/Carb modified Tolerating Diet: Yes Tolerating PT: Eval noted.   DRUG ALLERGIES:  No Known Allergies  VITALS:  Blood pressure 125/81, pulse 60, temperature 98.2 F (36.8 C), resp. rate 16, height 5\' 7"  (1.702 m), weight 82.6 kg, SpO2 96 %.  PHYSICAL EXAMINATION:   Physical Exam  GENERAL:  85 y.o.-year-old patient lying in bed lethargic but follows commands.  EYES: Pupils equal, round, reactive to light and accommodation. No scleral icterus. Extraocular muscles intact.  HEENT: Head atraumatic, normocephalic. Oropharynx and nasopharynx clear.  NECK:  Supple, no jugular venous distention. No thyroid enlargement, no tenderness.  LUNGS: Normal breath sounds bilaterally, no wheezing, rales, rhonchi. No use of accessory muscles of respiration.  CARDIOVASCULAR: S1, S2 normal. No murmurs, rubs, or gallops.  ABDOMEN: Soft, nontender, nondistended. Bowel sounds present. No organomegaly or mass.  EXTREMITIES: No cyanosis, clubbing or edema b/l.     NEUROLOGIC: Cranial nerves II through XII are intact. No focal Motor or sensory deficits b/l. Globally weak.   PSYCHIATRIC: The patient is alert and oriented x 3.  SKIN: No obvious rash, lesion, or ulcer.   Pressures sores as below             LABORATORY PANEL:   CBC Recent Labs  Lab 10/14/18 0424 10/14/18 1312  WBC 11.2*  --   HGB 11.8* 12.0  HCT 39.0  --   PLT 351  --    ------------------------------------------------------------------------------------------------------------------  Chemistries  Recent Labs  Lab 10/14/18 0424  NA 143  K 4.3  CL 118*  CO2 17*  GLUCOSE 150*  BUN 59*  CREATININE 1.27*  CALCIUM 9.1  AST 17  ALT 22  ALKPHOS 93  BILITOT 0.7   ------------------------------------------------------------------------------------------------------------------  Cardiac Enzymes Recent Labs  Lab 10/12/18 1715  TROPONINI <0.03   ------------------------------------------------------------------------------------------------------------------  RADIOLOGY:  Ct Head Wo Contrast  Result Date: 10/12/2018 CLINICAL DATA:  Muscle weakness. EXAM: CT HEAD WITHOUT CONTRAST TECHNIQUE: Contiguous axial images were obtained from the base of the skull through the vertex without intravenous contrast. COMPARISON:  CT scan of February 25, 2012. FINDINGS: Brain: Mild diffuse cortical atrophy is noted. Mild chronic ischemic white matter disease is noted. No mass effect or midline shift is noted. Ventricular size is within normal limits. There is no evidence of mass lesion, hemorrhage or acute infarction. Vascular: No hyperdense vessel or unexpected calcification. Skull: Normal. Negative for fracture or focal lesion. Sinuses/Orbits: No acute finding. Other: None. IMPRESSION: Mild diffuse cortical atrophy. Mild chronic ischemic white matter disease. No acute intracranial abnormality seen. Electronically Signed   By: Bobbe Medico.D.  On: 10/12/2018 18:29   Dg  Chest Portable 1 View  Result Date: 10/12/2018 CLINICAL DATA:  Weakness. EXAM: PORTABLE CHEST 1 VIEW COMPARISON:  Radiographs of March 22, 2012. FINDINGS: The heart size and mediastinal contours are within normal limits. Both lungs are clear. No pneumothorax or pleural effusion is noted. The visualized skeletal structures are unremarkable. IMPRESSION: No active disease. Electronically Signed   By: Marijo Conception M.D.   On: 10/12/2018 17:48     ASSESSMENT AND PLAN:   85 year old female with past medical history of hypertension, hyperlipidemia, diabetes, COPD, history of B12 deficiency who presented to the hospital due to generalized weakness and difficulty ambulating and noted to have a UTI and multiple pressure sores.  1.  Generalized weakness/difficulty ambulating-secondary to deconditioning and also possible underlying UTI. -Urine cultures positive for strep viridans.  Continue IV ceftriaxone for now. - Appreciate physical therapy evaluation and patient will likely need short-term rehab.  2.  UTI- urine cultures are growing Strep. Viridans.  - cont. Ceftriaxone X 3 days then stop.   3.  Pressure ulcers- patient has multiple pressure ulcers with stage I and II on the left and right ankle respectively and stage III-IV on the sacrum. - Seen by wound remotely and the recommend continued local wound care and possible debridement of the sacral ulcer.  Seen by general surgery yesterday and they offered surgical treatment to the patient and patient's family but they are currently refusing. -Continue local wound care for now.  4. Nausea/Coffee ground emesis - pt. Had an episode this afternoon. - Continue antiemetics, will get a KUB, check stat hemoglobin.  If needed consider GI consult.  5.  Diabetes type 2 without complication-continue sliding scale insulin.  Follow blood sugars which are stable.   6.  Depression-continue Seroquel, Lamictal, Zoloft.     All the records are reviewed and  case discussed with Care Management/Social Worker. Management plans discussed with the patient, family and they are in agreement.  CODE STATUS: Full code  DVT Prophylaxis: Hep. SQ  TOTAL TIME TAKING CARE OF THIS PATIENT: 30 minutes.   POSSIBLE D/C IN 1-2 DAYS, DEPENDING ON CLINICAL CONDITION.   Henreitta Leber M.D on 10/14/2018 at 1:36 PM  Between 7am to 6pm - Pager - [phone]  After 6pm go to [email or web address] - Proofreader  [facility]  Office  [phone]  CC: Primary care physician; [name], MD

## 2018-10-14 NOTE — Progress Notes (Signed)
PT Cancellation Note  Patient Details Name: Monica [name] MRN: [id number] DOB: 1930-03-26   Cancelled Treatment:    Reason Eval/Treat Not Completed: Patient declined, no reason specified Patient is sleeping and unwilling to work with PT this am, will check back this afternoon  Shelton Silvas PT, DPT Shelton Silvas 10/14/2018, 9:36 AM

## 2018-10-14 NOTE — Consult Note (Signed)
Consultation Note Date: 10/14/2018   Patient Name: Monica [name]  DOB: Jan 15, 1930  MRN: [id number]  Age / Sex: 85 y.o., female  PCP: [name], MD Referring Physician: Henreitta Leber, MD  Reason for Consultation: Establishing goals of care  HPI/Patient Profile: The patient with past medical history of diabetes, hypertension, spinal stenosis and COPD presents to the emergency department due to decreased mobility. She has a sacral wound evaluated by surgery. She is currently being treated for a UTI.   Clinical Assessment and Goals of Care: Patient is resting in bed. She does not open her eyes to speak to me. Spoke with her daughter Joseph Art via phone, and her daughter (patient's granddaughter.) Ms. Halvorson has 3 children. She lives with Joseph Art and [place]. There is no HPOA.  They state she fell in January and came to live with her. She used a wheelchair for mobility. Over the past 2 weeks she has "just given up". She has not wanted to eat or drink and has been bedbound. They have tried to turn her and prop her with pillows, but she rolls back on her back.    We discussed her diagnosis, prognosis, GOC, EOL wishes disposition and options.   The difference between an aggressive medical intervention path and a comfort care path was discussed.  We discussed quality of life. Values and goals of care important to patient and family were attempted to be elicited.  Discussed limitations of medical interventions to prolong quality of life for this patient at this time in this situation and discussed the concept of human mortality.  Joseph Art states she will speak with her other 2 siblings, and we will have a family conversation tomorrow morning.        SUMMARY OF RECOMMENDATIONS    Family conversation tomorrow for [place].   Code Status/Advance Care Planning:  Full code  Prognosis:   Poor  Discharge Planning: To Be  Determined      Primary Diagnoses: Present on Admission: **None**   I have reviewed the medical record, interviewed the patient and family, and examined the patient. The following aspects are pertinent.  Past Medical History:  Diagnosis Date  . B12 deficiency   . COPD (chronic obstructive pulmonary disease) ([place])   . Diabetes mellitus   . H/O: Bell's palsy   . Hyperlipidemia   . Hypertension   . Lung mass April 2011   s/p biopsy, no cancer cells found, Repeat CT Dec 2012 unchanged   Social History   Socioeconomic History  . Marital status: Widowed    Spouse name: Not on file  . Number of children: Not on file  . Years of education: Not on file  . Highest education level: Not on file  Occupational History  . Not on file  Social Needs  . Financial resource strain: Not very hard  . Food insecurity:    Worry: Patient refused    Inability: Patient refused  . Transportation needs:    Medical: Patient refused    Non-medical: Patient  refused  Tobacco Use  . Smoking status: Former Smoker    Packs/day: 0.50    Years: 5.00    Pack years: 2.50    Types: Cigarettes  . Smokeless tobacco: Never Used  . Tobacco comment: quit approx 2016  Substance and Sexual Activity  . Alcohol use: No  . Drug use: No  . Sexual activity: Not Currently  Lifestyle  . Physical activity:    Days per week: Patient refused    Minutes per session: Patient refused  . Stress: Only a little  Relationships  . Social connections:    Talks on phone: Patient refused    Gets together: Patient refused    Attends religious service: Patient refused    Active member of club or organization: Patient refused    Attends meetings of clubs or organizations: Patient refused    Relationship status: Patient refused  Other Topics Concern  . Not on file  Social History Narrative  . Not on file   Family History  Problem Relation Age of Onset  . Cancer Mother 76       Breast  . Diabetes Mother   . Heart  disease Father 22       Acute MI  . Heart disease Sister   . Arthritis Sister   . Heart disease Brother    Scheduled Meds: . collagenase   Topical Daily  . docusate sodium  100 mg Oral BID  . heparin  5,000 Units Subcutaneous Q8H  . insulin aspart  0-15 Units Subcutaneous TID WC  . insulin aspart  0-5 Units Subcutaneous QHS  . lamoTRIgine  50 mg Oral QHS  . multivitamin-lutein  1 capsule Oral Daily  . Ensure Max Protein  11 oz Oral BID BM  . QUEtiapine  25 mg Oral BID  . sertraline  50 mg Oral Daily   Continuous Infusions: . sodium chloride 125 mL/hr at 10/13/18 1047  . cefTRIAXone (ROCEPHIN)  IV Stopped (10/14/18 0908)   PRN Meds:.acetaminophen **OR** acetaminophen, ondansetron **OR** ondansetron (ZOFRAN) IV Medications Prior to Admission:  Prior to Admission medications   Medication Sig Start Date End Date Taking? Authorizing Provider  ACCU-CHEK SMARTVIEW test strip USE 2 TIMES DAILY FOR DIABETIC TESTING Patient taking differently: 1 each by Other route 2 (two) times daily.  12/08/14  Yes [name], MD  acetaminophen (TYLENOL) 500 MG tablet Take 500 mg by mouth as needed for mild pain or fever.    Yes [provider]  atenolol (TENORMIN) 50 MG tablet TAKE 1 TABLET BY MOUTH TWICE A DAY Patient taking differently: Take 50 mg by mouth 2 (two) times daily.  06/05/18  Yes [name], MD  BD INSULIN SYRINGE U/F 30G X 1/2" 0.5 ML MISC USE AS DIRECTED 03/17/18  Yes [name], MD  blood glucose meter kit and supplies Use to check blood sugars up to two times daily. Dispense based on patient and insurance preference. (FOR ICD-10 E10.9, E11.9). 09/29/18  Yes [name], MD  insulin NPH Human (HUMULIN N) 100 UNIT/ML injection INJECT 20 UNITS AT BEDTIME AND 5 UNITS THE THE MORNING UNLESS BG IS LESS THAN 150 Patient taking differently: Inject 10 Units into the skin at bedtime.  08/20/18  Yes [name], MD  lamoTRIgine (LAMICTAL) 25 MG tablet TAKE 2 TABLETS BY  MOUTH AT BEDTIME (NEEDS  OFFICE  VISIT  PRIOR  TO  ANYMORE  REFILLS) Patient taking differently: Take 50 mg by mouth at bedtime.  09/28/18  Yes [name], MD  lisinopril (PRINIVIL,ZESTRIL) 40 MG tablet TAKE 1 TABLET BY MOUTH DAILY Patient taking differently: Take 40 mg by mouth daily. TAKE 1 TABLET BY MOUTH DAILY 12/29/17  Yes [name], MD  metFORMIN (GLUCOPHAGE) 850 MG tablet TAKE ONE TABLET BY MOUTH TWICE DAILY WITH FOOD AS DIRECTED. Patient taking differently: Take 850 mg by mouth 2 (two) times daily with a meal.  07/27/18  Yes [name], MD  PHARMACIST CHOICE LANCETS MISC USE 2 TIMES DAILY FOR DIABETIC TESTING Patient taking differently: Inject 10 each into the skin 2 (two) times daily.  12/08/14  Yes [name], MD  QUEtiapine (SEROQUEL) 25 MG tablet Take 1 tablet (25 mg total) by mouth 2 (two) times daily. Patient taking differently: Take 25 mg by mouth at bedtime.  10/06/18  Yes [name], MD  sertraline (ZOLOFT) 50 MG tablet Take 1 tablet (50 mg total) by mouth daily. 09/25/18  Yes [name], MD  traMADol (ULTRAM) 50 MG tablet Take 1 tablet (50 mg total) by mouth every 12 (twelve) hours as needed for severe pain. 09/21/18  Yes [name], MD  atorvastatin (LIPITOR) 80 MG tablet TAKE ONE TABLET BY MOUTH EVERY DAY Patient not taking: Reported on 10/13/2018 02/19/17   [name], MD  cyanocobalamin (,VITAMIN B-12,) 1000 MCG/ML injection INJECT 1ML INTRAMUSCULAR ONCE WEEKLY FOR 3 WEEKS THEN ONCE A MONTH Patient not taking: Reported on 10/13/2018 06/19/18   [name], MD  diclofenac sodium (VOLTAREN) 1 % GEL Apply 4 g topically 4 (four) times daily. Patient not taking: Reported on 10/13/2018 07/20/18   Sherrie George B, FNP   No Known Allergies Review of Systems  Unable to perform ROS   Physical Exam Constitutional:      Comments: Resting with eyes closed.   Pulmonary:     Effort: Pulmonary effort is normal.     Vital Signs: BP 128/70 (BP  Location: Left Arm)   Pulse 71   Temp 98.1 F (36.7 C) (Oral)   Resp 16   Ht _0  (1.702 m)   Wt 82.6 kg   SpO2 98%   BMI 28.51 kg/m  Pain Scale: PAINAD   Pain Score: 0-No pain   SpO2: SpO2: 98 % O2 Device:SpO2: 98 % O2 Flow Rate: .   IO: Intake/output summary:   Intake/Output Summary (Last 24 hours) at 10/14/2018 1109 Last data filed at 10/14/2018 1107 Gross per 24 hour  Intake 328.82 ml  Output 650 ml  Net -321.18 ml    LBM: Last BM Date: 10/13/18 Baseline Weight: Weight: 78.9 kg Most recent weight: Weight: 82.6 kg     Palliative Assessment/Data: 30% at best.      Time In: 10:30 Time Out: 11:20 Time Total: 50 min Greater than 50%  of this time was spent counseling and coordinating care related to the above assessment and plan.  Signed by: Asencion Gowda, NP   Please contact Palliative Medicine Team phone at [phone] for questions and concerns.  For individual provider: See Shea Evans

## 2018-10-15 DIAGNOSIS — L98423 Non-pressure chronic ulcer of back with necrosis of muscle: Secondary | ICD-10-CM

## 2018-10-15 DIAGNOSIS — L89153 Pressure ulcer of sacral region, stage 3: Secondary | ICD-10-CM

## 2018-10-15 LAB — BASIC METABOLIC PANEL
Anion gap: 8 (ref 5–15)
BUN: 40 mg/dL — ABNORMAL HIGH (ref 8–23)
CO2: 15 mmol/L — ABNORMAL LOW (ref 22–32)
Calcium: 8.1 mg/dL — ABNORMAL LOW (ref 8.9–10.3)
Chloride: 122 mmol/L — ABNORMAL HIGH (ref 98–111)
Creatinine, Ser: 1.06 mg/dL — ABNORMAL HIGH (ref 0.44–1.00)
GFR calc Af Amer: 55 mL/min — ABNORMAL LOW (ref >60)
GFR calc non Af Amer: 48 mL/min — ABNORMAL LOW (ref >60)
Glucose, Bld: 119 mg/dL — ABNORMAL HIGH (ref 70–99)
Potassium: 3.8 mmol/L (ref 3.5–5.1)
Sodium: 145 mmol/L (ref 135–145)

## 2018-10-15 LAB — CBC
HCT: 33.5 % — ABNORMAL LOW (ref 36.0–46.0)
Hemoglobin: 10.2 g/dL — ABNORMAL LOW (ref 12.0–15.0)
MCH: 27.1 pg (ref 26.0–34.0)
MCHC: 30.4 g/dL (ref 30.0–36.0)
MCV: 88.9 fL (ref 80.0–100.0)
Platelets: 289 10*3/uL (ref 150–400)
RBC: 3.77 MIL/uL — ABNORMAL LOW (ref 3.87–5.11)
RDW: 16 % — ABNORMAL HIGH (ref 11.5–15.5)
WBC: 9.3 10*3/uL (ref 4.0–10.5)
nRBC: 0 % (ref 0.0–0.2)

## 2018-10-15 LAB — GLUCOSE, CAPILLARY
Glucose-Capillary: 108 mg/dL — ABNORMAL HIGH (ref 70–99)
Glucose-Capillary: 114 mg/dL — ABNORMAL HIGH (ref 70–99)
Glucose-Capillary: 150 mg/dL — ABNORMAL HIGH (ref 70–99)
Glucose-Capillary: 141 mg/dL — ABNORMAL HIGH (ref 70–99)

## 2018-10-15 LAB — MAGNESIUM: Magnesium: 1.4 mg/dL — ABNORMAL LOW (ref 1.7–2.4)

## 2018-10-15 LAB — PHOSPHORUS: Phosphorus: 1.9 mg/dL — ABNORMAL LOW (ref 2.5–4.6)

## 2018-10-15 MED ORDER — CHLORHEXIDINE GLUCONATE CLOTH 2 % EX PADS
6.0000 | MEDICATED_PAD | Freq: Once | CUTANEOUS | Status: AC
  Administered 2018-10-16: 6 via TOPICAL

## 2018-10-15 MED ORDER — ENOXAPARIN SODIUM 40 MG/0.4ML ~~LOC~~ SOLN
40.0000 mg | SUBCUTANEOUS | Status: DC
  Administered 2018-10-15 – 2018-10-20 (×6): 40 mg via SUBCUTANEOUS
  Filled 2018-10-15 (×6): qty 0.4

## 2018-10-15 MED ORDER — CHLORHEXIDINE GLUCONATE CLOTH 2 % EX PADS
6.0000 | MEDICATED_PAD | Freq: Once | CUTANEOUS | Status: AC
  Administered 2018-10-15: 6 via TOPICAL

## 2018-10-15 MED ORDER — QUETIAPINE FUMARATE 25 MG PO TABS
25.0000 mg | ORAL_TABLET | Freq: Two times a day (BID) | ORAL | Status: DC | PRN
  Administered 2018-10-18 – 2018-10-19 (×2): 25 mg via ORAL
  Filled 2018-10-15 (×2): qty 1

## 2018-10-15 NOTE — Consult Note (Signed)
Monica [name] Psychiatry Consult   Reason for Consult:  Depression and confusion Referring Physician:  Dr. Tressia Miners Patient Identification: Monica [name] MRN:  [id number] Principal Diagnosis: Weakness Diagnosis:  Principal Problem:   Weakness Active Problems:   Major depressive disorder, recurrent episode with anxious distress ([place])   Pressure injury of skin  Patient seen, chart reviewed, collateral obtained from patient's daughter Monica [name] [phone]) Total Time spent with patient: 1 hour  Subjective:  "I have a lot of depression"  HPI:  Monica [name] is a 85 y.o. female patient with past medical history of diabetes, hypertension, spinal stenosis and COPD presents to the emergency department due to decreased mobility. The patient's family is also concerned about a pressure ulcer on her back.  The patient recently had a urinary tract infection for which she completed antibiotic therapy.  Laboratory evaluation today shows recurrent UTI.  Furthermore, the patient is clinically dehydrated and generally weak.  Her blood pressure has been intermittently low but has been responsive to fluid boluses.  Given her generalized debility and intermittent hypotension the emergency department staff called the hospitalist service for admission.  Psychiatry consult is requested for assistance with delirium.  On evaluation, patient is resting quietly in bed.  She arouses easily.  She appears to be awake alert and oriented to person, place, date and situation.  She is aware of current events.  However when asked her age she states she is 85 years old and when asked where she lives she states she lives with mommy and daddy.  Patient describes that she is very depressed.  She denies any history of self-harm or suicide attempts.  Patient denies any paranoia or hallucinations.  At this time she does not appear to be responding to internal stimuli.  Collateral was obtained from patient's daughter Monica [name]): Daughter describes that mother has had depression for significant period of time due to back pain, however has always managed it well.  She has never seen an outpatient psychiatrist yet has her depression managed by her primary care provider.  Daughter specifically denies that mother has a history of bipolar disorder, noting that her mother can historically and currently have a temper. Daughter states that since mother has been sick she has been sitting around and developed a pressure ulcer.  She notes that she has been having fluctuations of confusion and episodes of being clear and logical.  She states that her mother suddenly became very confused and had begun hallucinating after Zoloft dose increased and on she was started on a painkiller, tramadol.  Daughter describes that her mother has been seeing people not there and talking to her deceased son.  Family brought patient to the hospital for concern that she may have had a stroke.  Past Psychiatric History: Depression, anxiety, delirium  Risk to Self:  none Risk to Others:  none Prior Inpatient Therapy:  denies Prior Outpatient Therapy:  denies  Past Medical History:  Past Medical History:  Diagnosis Date  . B12 deficiency   . COPD (chronic obstructive pulmonary disease) ([place])   . Diabetes mellitus   . H/O: Bell's palsy   . Hyperlipidemia   . Hypertension   . Lung mass April 2011   s/p biopsy, no cancer cells found, Repeat CT Dec 2012 unchanged    Past Surgical History:  Procedure Laterality Date  . ABDOMINAL HYSTERECTOMY    . CHOLECYSTECTOMY    . LUNG BIOPSY  2012   for positive PET, negative biopsy for  malignancy  . SPINE SURGERY  2001   Lumbar   Family History:  Family History  Problem Relation Age of Onset  . Cancer Mother 6       Breast  . Diabetes Mother   . Heart disease Father 43       Acute MI  . Heart disease Sister   . Arthritis Sister   . Heart disease Brother    Family Psychiatric  History:  Denies  Social History:  Social History   Substance and Sexual Activity  Alcohol Use No     Social History   Substance and Sexual Activity  Drug Use No    Social History   Socioeconomic History  . Marital status: Widowed    Spouse name: Not on file  . Number of children: Not on file  . Years of education: Not on file  . Highest education level: Not on file  Occupational History  . Not on file  Social Needs  . Financial resource strain: Not very hard  . Food insecurity:    Worry: Patient refused    Inability: Patient refused  . Transportation needs:    Medical: Patient refused    Non-medical: Patient refused  Tobacco Use  . Smoking status: Former Smoker    Packs/day: 0.50    Years: 5.00    Pack years: 2.50    Types: Cigarettes  . Smokeless tobacco: Never Used  . Tobacco comment: quit approx 2016  Substance and Sexual Activity  . Alcohol use: No  . Drug use: No  . Sexual activity: Not Currently  Lifestyle  . Physical activity:    Days per week: Patient refused    Minutes per session: Patient refused  . Stress: Only a little  Relationships  . Social connections:    Talks on phone: Patient refused    Gets together: Patient refused    Attends religious service: Patient refused    Active member of club or organization: Patient refused    Attends meetings of clubs or organizations: Patient refused    Relationship status: Patient refused  Other Topics Concern  . Not on file  Social History Narrative  . Not on file   Additional Social History: Patient lives with her husband, daughter and granddaughter.   Patient states that she is 85 years old and living with her mommy and daddy  Denies alcohol or drug use.  Does not have access to weapons.     Allergies:  No Known Allergies  Labs:  Results for orders placed or performed during the hospital encounter of 10/12/18 (from the past 48 hour(s))  Glucose, capillary     Status: Abnormal   Collection Time:  10/13/18  4:35 PM  Result Value Ref Range   Glucose-Capillary 125 (H) 70 - 99 mg/dL  Glucose, capillary     Status: Abnormal   Collection Time: 10/13/18  9:07 PM  Result Value Ref Range   Glucose-Capillary 131 (H) 70 - 99 mg/dL  CBC     Status: Abnormal   Collection Time: 10/14/18  4:24 AM  Result Value Ref Range   WBC 11.2 (H) 4.0 - 10.5 K/uL   RBC 4.31 3.87 - 5.11 MIL/uL   Hemoglobin 11.8 (L) 12.0 - 15.0 g/dL   HCT 39.0 36.0 - 46.0 %   MCV 90.5 80.0 - 100.0 fL   MCH 27.4 26.0 - 34.0 pg   MCHC 30.3 30.0 - 36.0 g/dL   RDW 15.9 (H) 11.5 - 15.5 %  Platelets 351 150 - 400 K/uL   nRBC 0.0 0.0 - 0.2 %    Comment: Performed at [facility], [address] [place], [place] [address]  Comprehensive metabolic panel     Status: Abnormal   Collection Time: 10/14/18  4:24 AM  Result Value Ref Range   Sodium 143 135 - 145 mmol/L   Potassium 4.3 3.5 - 5.1 mmol/L   Chloride 118 (H) 98 - 111 mmol/L   CO2 17 (L) 22 - 32 mmol/L   Glucose, Bld 150 (H) 70 - 99 mg/dL   BUN 59 (H) 8 - 23 mg/dL   Creatinine, Ser 1.27 (H) 0.44 - 1.00 mg/dL   Calcium 9.1 8.9 - 10.3 mg/dL   Total Protein 6.5 6.5 - 8.1 g/dL   Albumin 2.3 (L) 3.5 - 5.0 g/dL   AST 17 15 - 41 U/L   ALT 22 0 - 44 U/L   Alkaline Phosphatase 93 38 - 126 U/L   Total Bilirubin 0.7 0.3 - 1.2 mg/dL   GFR calc non Af Amer 38 (L) >60 mL/min   GFR calc Af Amer 44 (L) >60 mL/min   Anion gap 8 5 - 15    Comment: Performed at [facility], [address] [place], [place] [address]  Protime-INR     Status: Abnormal   Collection Time: 10/14/18  4:24 AM  Result Value Ref Range   Prothrombin Time 15.3 (H) 11.4 - 15.2 seconds   INR 1.2 0.8 - 1.2    Comment: (NOTE) INR goal varies based on device and disease states. Performed at [facility], [address] [place], [place] [address]   APTT     Status: None   Collection Time: 10/14/18  4:24 AM  Result Value Ref Range   aPTT 35 24 - 36 seconds    Comment:  Performed at [facility], [address] [place], [place] [address]  Glucose, capillary     Status: Abnormal   Collection Time: 10/14/18  7:29 AM  Result Value Ref Range   Glucose-Capillary 137 (H) 70 - 99 mg/dL  Glucose, capillary     Status: Abnormal   Collection Time: 10/14/18 11:45 AM  Result Value Ref Range   Glucose-Capillary 140 (H) 70 - 99 mg/dL  Hemoglobin     Status: None   Collection Time: 10/14/18  1:12 PM  Result Value Ref Range   Hemoglobin 12.0 12.0 - 15.0 g/dL    Comment: Performed at [facility] [address] [place], [place] [address]  Glucose, capillary     Status: Abnormal   Collection Time: 10/14/18  4:37 PM  Result Value Ref Range   Glucose-Capillary 105 (H) 70 - 99 mg/dL   Comment 1 Notify RN   Glucose, capillary     Status: Abnormal   Collection Time: 10/14/18  9:14 PM  Result Value Ref Range   Glucose-Capillary 104 (H) 70 - 99 mg/dL  CBC     Status: Abnormal   Collection Time: 10/15/18  3:38 AM  Result Value Ref Range   WBC 9.3 4.0 - 10.5 K/uL   RBC 3.77 (L) 3.87 - 5.11 MIL/uL   Hemoglobin 10.2 (L) 12.0 - 15.0 g/dL   HCT 33.5 (L) 36.0 - 46.0 %   MCV 88.9 80.0 - 100.0 fL   MCH 27.1 26.0 - 34.0 pg   MCHC 30.4 30.0 - 36.0 g/dL   RDW 16.0 (H) 11.5 - 15.5 %   Platelets 289  150 - 400 K/uL   nRBC 0.0 0.0 - 0.2 %    Comment: Performed at [facility], [address] [place], [place] [address]  Basic metabolic panel     Status: Abnormal   Collection Time: 10/15/18  3:38 AM  Result Value Ref Range   Sodium 145 135 - 145 mmol/L   Potassium 3.8 3.5 - 5.1 mmol/L   Chloride 122 (H) 98 - 111 mmol/L   CO2 15 (L) 22 - 32 mmol/L   Glucose, Bld 119 (H) 70 - 99 mg/dL   BUN 40 (H) 8 - 23 mg/dL   Creatinine, Ser 1.06 (H) 0.44 - 1.00 mg/dL   Calcium 8.1 (L) 8.9 - 10.3 mg/dL   GFR calc non Af Amer 48 (L) >60 mL/min   GFR calc Af Amer 55 (L) >60 mL/min   Anion gap 8 5 - 15    Comment: Performed at [facility], [address] [place], [place] [address]  Glucose, capillary     Status: Abnormal   Collection Time: 10/15/18  7:48 AM  Result Value Ref Range   Glucose-Capillary 108 (H) 70 - 99 mg/dL    Current Facility-Administered Medications  Medication Dose Route Frequency Provider Last Rate Last Dose  . 0.9 %  sodium chloride infusion   Intravenous Continuous Harrie Foreman, MD 125 mL/hr at 10/15/18 [phone]    . acetaminophen (TYLENOL) tablet 650 mg  650 mg Oral Q6H PRN Harrie Foreman, MD   650 mg at 10/13/18 0459   Or  . acetaminophen (TYLENOL) suppository 650 mg  650 mg Rectal Q6H PRN Harrie Foreman, MD      . collagenase (SANTYL) ointment   Topical Daily Henreitta Leber, MD      . docusate sodium (COLACE) capsule 100 mg  100 mg Oral BID Harrie Foreman, MD   100 mg at 10/15/18 1004  . heparin injection 5,000 Units  5,000 Units Subcutaneous Q8H Harrie Foreman, MD   5,000 Units at 10/15/18 0505  . insulin aspart (novoLOG) injection 0-15 Units  0-15 Units Subcutaneous TID WC Harrie Foreman, MD   2 Units at 10/14/18 1149  . insulin aspart (novoLOG) injection 0-5 Units  0-5 Units Subcutaneous QHS Harrie Foreman, MD      . lamoTRIgine (LAMICTAL) tablet 50 mg  50 mg Oral QHS Henreitta Leber, MD   50 mg at 10/14/18 2148  . multivitamin-lutein (OCUVITE-LUTEIN) capsule 1 capsule  1 capsule Oral Daily Henreitta Leber, MD   1 capsule at 10/15/18 1005  . ondansetron (ZOFRAN) tablet 4 mg  4 mg Oral Q6H PRN Harrie Foreman, MD       Or  . ondansetron [facility]) injection 4 mg  4 mg Intravenous Q6H PRN Harrie Foreman, MD      . protein supplement (ENSURE MAX) liquid  11 oz Oral BID BM Henreitta Leber, MD   11 oz at 10/13/18 1410  . QUEtiapine (SEROQUEL) tablet 25 mg  25 mg Oral BID Henreitta Leber, MD   25 mg at 10/15/18 1004  . sertraline (ZOLOFT) tablet 50 mg  50 mg Oral Daily Henreitta Leber, MD   50 mg at 10/15/18 1004    Musculoskeletal: Strength & Muscle Tone:  decreased Gait & Station: Not assessed, patient in bed Patient leans: N/A  Psychiatric Specialty Exam: Physical Exam  Nursing note and vitals reviewed. Constitutional: She appears well-developed and well-nourished. No distress.  HENT:  Head: Normocephalic and atraumatic.  Eyes: EOM are normal.  Neck: Normal range of motion.  Cardiovascular: Normal rate and regular rhythm.  Respiratory: Effort normal. No respiratory distress.  Musculoskeletal: Normal range of motion.  Neurological: She is alert.    Review of Systems  Psychiatric/Behavioral: Positive for depression. Negative for hallucinations, memory loss, substance abuse and suicidal ideas. The patient is not nervous/anxious and does not have insomnia.   All other systems reviewed and are negative. Patient having fluctuating levels of consciousness  Blood pressure 140/73, pulse 76, temperature (!) 97.4 F (36.3 C), temperature source Oral, resp. rate 17, height 5\' 7"  (1.702 m), weight 101.2 kg, SpO2 99 %.Body mass index is 34.93 kg/m.  General Appearance: Fairly Groomed  Eye Contact:  Fair  Speech:  Clear and Coherent  Volume:  Decreased  Mood:  Dysphoric and Tired  Affect:  Congruent and Depressed  Thought Process:  Coherent  Orientation:  Full (Time, Place, and Person)  Thought Content:  Illogical  Suicidal Thoughts:  No  Homicidal Thoughts:  No  Memory:  Fair  Judgement:  Fair  Insight:  Present  Psychomotor Activity:  Decreased  Concentration:  Attention Span: Fair  Recall:  [employer] of Knowledge:  Fair  Language:  Good  Akathisia:  No  Handed:  Right  AIMS (if indicated):     Assets:  Desire for Improvement Financial Resources/Insurance Housing Social Support  ADL's:  Impaired  Cognition:  Impaired,  Mild  Sleep:   Adequate in hospital    Treatment Plan Summary: Medication management  Change Seroquel 25 mg to twice daily as needed only for agitated delirium, as this may be over sedating to  patient. Continue Zoloft at 50 mg daily and Lamictal at 50 mg at bedtime per home doses. It is unclear as to if patient would continue to need Lamictal as ongoing treatment versus maximizing single agent for mood disorder to avoid polypharmacy. Avoid benzodiazepines, opiates and anticholinergics.  The patient's exam is notable for altered sensorium, perceptual disturbances, disorientation and cognitive deficits that appear markedly different than their baseline, suggesting a diagnosis of delirium.  Virtually any medical condition or physiologic stress can precipitate delirium in a susceptible individual, with risk increasing in those with: advanced age, sensory impairments, organic brain disease (stroke, dementia, Parkinsons), psychiatric illness, major chronic medical issues, prolonged hospitalizations, postoperative status, anemia, insomnia/disturbed sleep, and severe pain. Addressing the underlying medical condition and institution of preventative measures are recommended.  - Continue to monitor and treat underlying medical causes of delirium, including infection, electrolyte disturbances, etc. - Delirium precautions - Minimize/avoid deliriogenic meds including: anticholinergic, opiates, benzodiazepines           - Maintain hydration, oxygenation, nutrition           - Limit use of restraints and catheters           - Normalize sleep patterns by minimizing nighttime noise, light and interruptions by                clustering care, opening blinds during the day           - Reorient the patient frequently, provide easily visible clock and calendar           - Provide sensory aids like glasses, hearing aids           - Encourage ambulation, regular activities and visitors to maintain cognitive stimulation   Disposition: No evidence of imminent risk to self or others at  present.   Patient does not meet criteria for psychiatric inpatient admission. Supportive therapy provided about ongoing  stressors.   Provided education to daughter regarding delirium.  Lavella Hammock, MD 10/15/2018 12:20 PM

## 2018-10-15 NOTE — H&P (View-Only) (Signed)
CC: Sacral decubitus ulcer Subjective:  Pt awake this am. No complaints. SHe is oriented to place and person. SHe know who the President is. D/W Grand daughter Lattie Haw) in detail and given that Mrs. Hanzlik condition has improved significantly over the last 48 hrs they wish to be aggressive and they are now ok w debridement.   Objective: Vital signs in last 24 hours: Temp:  [97.3 F (36.3 C)-97.5 F (36.4 C)] 97.4 F (36.3 C) (04/23 1000) Pulse Rate:  [67-76] 76 (04/23 1000) Resp:  [17-20] 17 (04/23 1000) BP: (103-140)/(53-73) 140/73 (04/23 1000) SpO2:  [98 %-100 %] 99 % (04/23 1000) Weight:  [101.2 kg] 101.2 kg (04/23 0500) Last BM Date: 10/15/18  Intake/Output from previous day: 04/22 0701 - 04/23 0700 In: 101.9 [IV Piggyback:101.9] Out: 550 [Urine:550] Intake/Output this shift: Total I/O In: -  Out: 250 [Urine:250]  Physical exam:  Debilitated female in NAD There is a sacral decubitus ulcer w surrounding erythema, skin is necrotic. No bone is exposed. No evidence of necrotizing infection  Lab Results: CBC  Recent Labs    10/14/18 0424 10/14/18 1312 10/15/18 0338  WBC 11.2*  --  9.3  HGB 11.8* 12.0 10.2*  HCT 39.0  --  33.5*  PLT 351  --  289   BMET Recent Labs    10/14/18 0424 10/15/18 0338  NA 143 145  K 4.3 3.8  CL 118* 122*  CO2 17* 15*  GLUCOSE 150* 119*  BUN 59* 40*  CREATININE 1.27* 1.06*  CALCIUM 9.1 8.1*   PT/INR Recent Labs    10/14/18 0424  LABPROT 15.3*  INR 1.2   ABG No results for input(s): PHART, HCO3 in the last 72 hours.  Invalid input(s): PCO2, PO2  Studies/Results: Dg Abd 1 View  Result Date: 10/14/2018 CLINICAL DATA:  Vomiting. EXAM: ABDOMEN - 1 VIEW COMPARISON:  None. FINDINGS: The bowel gas pattern is normal. No radio-opaque calculi or other significant radiographic abnormality are seen. Convex right lumbar scoliosis and multilevel degenerative change noted. IMPRESSION: No acute abnormality. Electronically Signed   By:  Inge Rise M.D.   On: 10/14/2018 14:12    Anti-infectives: Anti-infectives (From admission, onward)   Start     Dose/Rate Route Frequency Ordered Stop   10/13/18 1400  cefTRIAXone (ROCEPHIN) 1 g in sodium chloride 0.9 % 100 mL IVPB     1 g 200 mL/hr over 30 Minutes Intravenous Daily 10/13/18 1340 10/15/18 1028      Assessment/Plan:  Necrotic sacral decubitus For Debridement tomorrow in the OR possible vac  D/W the pt and the family in detail about the procedure. Risks, benefits and possible complications including but not limited to bleeding, infection, prolonged hospitalization and wound healing. They understand. Dr. Rosana Hoes is on call tomorrow and he has agree to perform the surgery. The OR today is pretty busy and the earliest they can do the debridement is at night, d/w the family and the pt and they agree with surgery tomorrow. We will keep NPO I have spent more than 40 min in this encounter w > 50% spent in coordination and counseling of her care.  Caroleen Hamman, MD, [facility]  10/15/2018

## 2018-10-15 NOTE — TOC Progression Note (Addendum)
Transition of Care [facility]) - Progression Note    Patient Details  Name: Monica [name] MRN: [id number] Date of Birth: 12/19/1929  Transition of Care [facility]) CM/SW [place], RN Phone Number: 10/15/2018, 3:09 PM  Clinical Narrative:    Spoke to the patient to offer bed offers, we reviewed them each one She decided that she wants to go to [facility] in [place], I asked her permission to speak to her family about it also, she requested that I call and speak to [place] called [facility] and spoke with her, Aneta Mins agreed that the patient should go to rehab and that [facility] would be good due to location She will call Lattie Haw and let her know and give Lattie Haw my number for any questions Called [facility] and accepted the bed By leaving Otila Kluver a Voice mail Requested a call back, sent Therapy notes and accepted in the Hub as well   Expected Discharge Plan: Skilled Nursing Facility Barriers to Discharge: Continued Medical Work up  Expected Discharge Plan and Services Expected Discharge Plan: [place]   Discharge Planning Services: CM Consult   Living arrangements for the past 2 months: Single Family Home Expected Discharge Date: 10/15/18                                     Social Determinants of Health (SDOH) Interventions    Readmission Risk Interventions No flowsheet data found.

## 2018-10-15 NOTE — Progress Notes (Signed)
[place] nurse follow up, seen by general surgery. Patient's family do not want to pursue aggressive surgical debridement as needed.  See surgeons note. I have instituted local wound care with an enzymatic debridement ointment, offloading for the heels and sacral pressure injuyr, nutritional consultation, low air loss mattress. Enzymatic debridement will be slow and in the presence of nutritional compromise the sacral ulcer will not progress and may evolve into a deeper ulceration.    Palliative care consulted, feel this is a good suggestion at this point.    Re consult if needed, will not follow at this time. Thanks  Shamarcus Hoheisel [employer], RN,CWOCN, CNS, [place] [phone])

## 2018-10-15 NOTE — Progress Notes (Signed)
CC: Sacral decubitus ulcer Subjective:  Pt awake this am. No complaints. SHe is oriented to place and person. SHe know who the President is. D/W Grand daughter Lattie Haw) in detail and given that Mrs. Woodin condition has improved significantly over the last 48 hrs they wish to be aggressive and they are now ok w debridement.   Objective: Vital signs in last 24 hours: Temp:  [97.3 F (36.3 C)-97.5 F (36.4 C)] 97.4 F (36.3 C) (04/23 1000) Pulse Rate:  [67-76] 76 (04/23 1000) Resp:  [17-20] 17 (04/23 1000) BP: (103-140)/(53-73) 140/73 (04/23 1000) SpO2:  [98 %-100 %] 99 % (04/23 1000) Weight:  [101.2 kg] 101.2 kg (04/23 0500) Last BM Date: 10/15/18  Intake/Output from previous day: 04/22 0701 - 04/23 0700 In: 101.9 [IV Piggyback:101.9] Out: 550 [Urine:550] Intake/Output this shift: Total I/O In: -  Out: 250 [Urine:250]  Physical exam:  Debilitated female in NAD There is a sacral decubitus ulcer w surrounding erythema, skin is necrotic. No bone is exposed. No evidence of necrotizing infection  Lab Results: CBC  Recent Labs    10/14/18 0424 10/14/18 1312 10/15/18 0338  WBC 11.2*  --  9.3  HGB 11.8* 12.0 10.2*  HCT 39.0  --  33.5*  PLT 351  --  289   BMET Recent Labs    10/14/18 0424 10/15/18 0338  NA 143 145  K 4.3 3.8  CL 118* 122*  CO2 17* 15*  GLUCOSE 150* 119*  BUN 59* 40*  CREATININE 1.27* 1.06*  CALCIUM 9.1 8.1*   PT/INR Recent Labs    10/14/18 0424  LABPROT 15.3*  INR 1.2   ABG No results for input(s): PHART, HCO3 in the last 72 hours.  Invalid input(s): PCO2, PO2  Studies/Results: Dg Abd 1 View  Result Date: 10/14/2018 CLINICAL DATA:  Vomiting. EXAM: ABDOMEN - 1 VIEW COMPARISON:  None. FINDINGS: The bowel gas pattern is normal. No radio-opaque calculi or other significant radiographic abnormality are seen. Convex right lumbar scoliosis and multilevel degenerative change noted. IMPRESSION: No acute abnormality. Electronically Signed   By:  Inge Rise M.D.   On: 10/14/2018 14:12    Anti-infectives: Anti-infectives (From admission, onward)   Start     Dose/Rate Route Frequency Ordered Stop   10/13/18 1400  cefTRIAXone (ROCEPHIN) 1 g in sodium chloride 0.9 % 100 mL IVPB     1 g 200 mL/hr over 30 Minutes Intravenous Daily 10/13/18 1340 10/15/18 1028      Assessment/Plan:  Necrotic sacral decubitus For Debridement tomorrow in the OR possible vac  D/W the pt and the family in detail about the procedure. Risks, benefits and possible complications including but not limited to bleeding, infection, prolonged hospitalization and wound healing. They understand. Dr. Rosana Hoes is on call tomorrow and he has agree to perform the surgery. The OR today is pretty busy and the earliest they can do the debridement is at night, d/w the family and the pt and they agree with surgery tomorrow. We will keep NPO I have spent more than 40 min in this encounter w > 50% spent in coordination and counseling of her care.  Caroleen Hamman, MD, [facility]  10/15/2018

## 2018-10-15 NOTE — Progress Notes (Signed)
[place] [facility] [place] NAME: Monica [name]    MR#:  [id number]  DATE OF BIRTH:  [birth year]  SUBJECTIVE:  CHIEF COMPLAINT:   Chief Complaint  Patient presents with  . Failure To Thrive   -   REVIEW OF SYSTEMS:  Review of Systems  Constitutional: Positive for malaise/fatigue. Negative for chills and fever.  HENT: Negative for congestion, ear discharge, hearing loss and nosebleeds.   Eyes: Negative for blurred vision and double vision.  Respiratory: Negative for cough, shortness of breath and wheezing.   Cardiovascular: Negative for chest pain, palpitations and leg swelling.  Gastrointestinal: Negative for abdominal pain, constipation, diarrhea, nausea and vomiting.  Genitourinary: Negative for dysuria.  Musculoskeletal: Negative for myalgias.  Neurological: Negative for dizziness, seizures and headaches.  Psychiatric/Behavioral: Positive for depression.       Some confusion    DRUG ALLERGIES:  No Known Allergies  VITALS:  Blood pressure 140/73, pulse 76, temperature (!) 97.4 F (36.3 C), temperature source Oral, resp. rate 17, height 5\' 7"  (1.702 m), weight 101.2 kg, SpO2 99 %.  PHYSICAL EXAMINATION:  Physical Exam   GENERAL:  85 y.o.-year-old patient lying in the bed with no acute distress.  EYES: Pupils equal, round, reactive to light and accommodation. No scleral icterus. Extraocular muscles intact.  HEENT: Head atraumatic, normocephalic. Oropharynx and nasopharynx clear.  NECK:  Supple, no jugular venous distention. No thyroid enlargement, no tenderness.  LUNGS: Normal breath sounds bilaterally, no wheezing, rales,rhonchi or crepitation. No use of accessory muscles of respiration. Decreased bibasilar breath sounds CARDIOVASCULAR: S1, S2 normal. No  rubs, or gallops. 2/6 systolic murmur present ABDOMEN: Soft, nontender, nondistended. Bowel sounds present. No organomegaly or mass.  EXTREMITIES: No pedal edema, cyanosis, or  clubbing.  NEUROLOGIC: Cranial nerves II through XII are intact. Muscle strength 5/5 in all extremities. Sensation intact. Gait not checked.  PSYCHIATRIC: The patient is alert and oriented x 2.  SKIN: No obvious rash, lesion, or ulcer.    LABORATORY PANEL:   CBC Recent Labs  Lab 10/15/18 0338  WBC 9.3  HGB 10.2*  HCT 33.5*  PLT 289   ------------------------------------------------------------------------------------------------------------------  Chemistries  Recent Labs  Lab 10/14/18 0424 10/15/18 0338  NA 143 145  K 4.3 3.8  CL 118* 122*  CO2 17* 15*  GLUCOSE 150* 119*  BUN 59* 40*  CREATININE 1.27* 1.06*  CALCIUM 9.1 8.1*  AST 17  --   ALT 22  --   ALKPHOS 93  --   BILITOT 0.7  --    ------------------------------------------------------------------------------------------------------------------  Cardiac Enzymes Recent Labs  Lab 10/12/18 1715  TROPONINI <0.03   ------------------------------------------------------------------------------------------------------------------  RADIOLOGY:  Dg Abd 1 View  Result Date: 10/14/2018 CLINICAL DATA:  Vomiting. EXAM: ABDOMEN - 1 VIEW COMPARISON:  None. FINDINGS: The bowel gas pattern is normal. No radio-opaque calculi or other significant radiographic abnormality are seen. Convex right lumbar scoliosis and multilevel degenerative change noted. IMPRESSION: No acute abnormality. Electronically Signed   By: Inge Rise M.D.   On: 10/14/2018 14:12    EKG:   Orders placed or performed during the hospital encounter of 10/12/18  . EKG 12-Lead  . EKG 12-Lead    ASSESSMENT AND PLAN:   85 year old female with past medical history of hypertension, hyperlipidemia, diabetes, COPD, history of B12 deficiency who presented to the hospital due to generalized weakness and difficulty ambulating and noted to have a UTI and multiple pressure sores.  1.  Generalized weakness/difficulty ambulating-secondary to  deconditioning  and also possible underlying UTI. - Patient was given oral fosfomycin for the UTI in the ER,on IV ceftriaxone now. -   urine cultures with strep viridans - Appreciate physical therapy evaluation and patient will likely need short-term rehab.  2.  Pressure ulcers- patient has multiple pressure ulcers with stage I and II on the left and right ankle respectively and stage III-IV on the sacrum. - Seen by wound remotely and the recommend continued local wound care and possible debridement of the sacral ulcer. - Patient and family agreeable for surgical debridement of sacral wound.-  - general surgery consulted  3.  Diabetes type 2 without complication-continue sliding scale insulin.  Follow blood sugars.  4.  Depression-continue Seroquel, Lamictal, Zoloft. Family requested psych consult as patient continues to be depressed and having side effects with some anti depressants. Consult requested.  5. DVT Prophylaxis- Heparin SQ   All the records are reviewed and case discussed with Care Management/Social Workerr. Management plans discussed with the patient, family and they are in agreement.  CODE STATUS: Full Code  TOTAL TIME TAKING CARE OF THIS PATIENT: 38 minutes.   POSSIBLE D/C IN 2-3 DAYS, DEPENDING ON CLINICAL CONDITION.   Gladstone Lighter M.D on 10/15/2018 at 11:56 AM  Between 7am to 6pm - Pager - [phone]  After 6pm go to [email or web address] - password [email or web address] [facility]  Office  [phone]  CC: Primary care physician; [name], MD

## 2018-10-15 NOTE — Progress Notes (Addendum)
Daily Progress Note   Patient Name: Monica [name]       Date: 10/15/2018 DOB: 07-24-1929  Age: 85 y.o. MRN#: [id number] Attending Physician: Gladstone Lighter, MD Primary Care Physician: [name], MD Admit Date: 10/12/2018  Reason for Consultation/Follow-up: Establishing goals of care  Subjective: Patient is resting in bed. She is alert and conversive at this time. She states she has been bedbound for the past few weeks because of knee pain from arthritis. She has pain from her pressure sore. She states this depresses her and she does not feel like doing anything. She states her husband lives at home with her, her daughter Lattie Haw, and granddaughter Aneta Mins. She states her husband is very forgetful, and could not make decisions for her if needed, stating her children would have to do that. Discussed considering HPOA papers if there is a certain person or two people she would like to be her decision maker if she becomes unable once she is discharged as they cannot be completed here due to visitor restrictions and lack of witness for notary.      We discussed her diagnosis, prognosis, GOC, EOL wishes disposition and options.  A detailed discussion was had today regarding advanced directives.  Concepts specific to code status, artifical feeding and hydration, IV antibiotics and rehospitalization were discussed.  The difference between an aggressive medical intervention path and a comfort care path was discussed.  Values and goals of care important to patient and family were attempted to be elicited.  Discussed limitations of medical interventions to prolong quality of life in some situations and discussed the concept of human mortality.  She states she does not like being in the hospital, and all  of the procedures and lab work. She states " I ain't had enough to just lay down and die". She states if her knee pain were controlled, and her ulcer pain, that would allow her to get out of bed, thus helping her depression. She states she was offered knee injections for pain relief. She also would like her depression better controlled.    Her QOL is independence and the ability to do things for herself, and hopefully work around the yard as well. She wants all care possible including a full code status except a feeding tube. She states she would never want  a feeding tube because her family would have to help her with the feeds.   Could not reach daughter Joseph Art as planned. Did speak with daughter Lattie Haw. Provided update as patient is now able to verbalize what she wants. She states she spoke with Renee last night, and they have agreed they will uphold whatever their mother wishes. Both of the daughters and granddaughter mention that they do not speak with their brother/uncle, that he has nothing to do with the family. Again, at this time, patient is verbalizing her wishes. Reentered patient's room to call Lattie Haw from the room so they can speak. Spoke with Lattie Haw again, she states her mother would like surgery to help her. She is comfortable with her mother's wishes for all care possible at this time.  The children are surrogate decision makers if patient is unable to make decisions as she and her children states her husband is unable.    Length of Stay: 1  Current Medications: Scheduled Meds:  . collagenase   Topical Daily  . docusate sodium  100 mg Oral BID  . heparin  5,000 Units Subcutaneous Q8H  . insulin aspart  0-15 Units Subcutaneous TID WC  . insulin aspart  0-5 Units Subcutaneous QHS  . lamoTRIgine  50 mg Oral QHS  . multivitamin-lutein  1 capsule Oral Daily  . Ensure Max Protein  11 oz Oral BID BM  . QUEtiapine  25 mg Oral BID  . sertraline  50 mg Oral Daily    Continuous Infusions: .  sodium chloride 125 mL/hr at 10/15/18 0649    PRN Meds: acetaminophen **OR** acetaminophen, ondansetron **OR** ondansetron (ZOFRAN) IV  Physical Exam Pulmonary:     Effort: Pulmonary effort is normal.  Musculoskeletal:     Comments: Lower extremity boots.   Neurological:     Mental Status: She is alert.     Comments: Conversive.              Vital Signs: BP 140/73 (BP Location: Left Arm)   Pulse 76   Temp (!) 97.4 F (36.3 C) (Oral)   Resp 17   Ht 5\' 7"  (1.702 m)   Wt 101.2 kg   SpO2 99%   BMI 34.93 kg/m  SpO2: SpO2: 99 % O2 Device: O2 Device: Room Air O2 Flow Rate:    Intake/output summary:   Intake/Output Summary (Last 24 hours) at 10/15/2018 1038 Last data filed at 10/15/2018 0209 Gross per 24 hour  Intake 100 ml  Output 550 ml  Net -450 ml   LBM: Last BM Date: 10/13/18 Baseline Weight: Weight: 78.9 kg Most recent weight: Weight: 101.2 kg       Palliative Assessment/Data:    Flowsheet Rows     Most Recent Value  Intake Tab  Referral Department  Hospitalist  Unit at Time of Referral  Med/Surg Unit  Date Notified  10/14/18  Palliative Care Type  New Palliative care  Reason for referral  Clarify Goals of Care  Date of Admission  10/12/18  Date first seen by Palliative Care  10/14/18  # of days Palliative referral response time  0 Day(s)  # of days IP prior to Palliative referral  2  Clinical Assessment  Psychosocial & Spiritual Assessment  Palliative Care Outcomes      Patient Active Problem List   Diagnosis Date Noted  . Weakness 10/13/2018  . Pressure injury of skin 10/13/2018  . Spinal stenosis of lumbar region 10/06/2018  . Fecal incontinence 10/06/2018  . Type  2 diabetes mellitus with diabetic nephropathy, with long-term current use of insulin ([place]) 09/27/2018  . CKD stage 3 due to type 2 diabetes mellitus ([place]) 09/27/2018  . Delirium due to another medical condition, acute, hyperactive 09/27/2018  . Impaired ambulation 09/27/2018  .  Recurrent falls 06/11/2017  . Dystrophic nail 11/13/2016  . Allergic rhinitis 09/30/2015  . Major depressive disorder, recurrent episode with anxious distress ([place]) 10/10/2013  . Vitamin D deficiency 10/08/2013  . Bilateral chronic knee pain 09/21/2011  . Tobacco abuse counseling 07/08/2011  . Incontinence of urine 06/13/2011  . History of tobacco abuse 06/11/2011  . Hyperlipidemia   . Hypertension   . COPD (chronic obstructive pulmonary disease) ([place])   . B12 deficiency   . Lung mass     Palliative Care Assessment & Plan    Recommendations/Plan:  Recommend working toward pain control of her knees and ulcer. Ultram causes confusion per her daughter.   Recommend psychiatry consult to work on depression as her medications are not controlling symptoms per patient and family.   Patient would like debridement as needed to help he quality of life.    Code Status:    Code Status Orders  (From admission, onward)         Start     Ordered   10/13/18 0458  Full code  Continuous     10/13/18 0457        Code Status History    This patient has a current code status but no historical code status.       Prognosis:   Unable to determine  Discharge Planning:  To Be Determined  Care plan was discussed with Primary MD.  Thank you for allowing the Palliative Medicine Team to assist in the care of this patient.   Time In: 9:15 Time Out: 10:45 Total Time 1.5 hrs Prolonged Time Billed  yes      Greater than 50%  of this time was spent counseling and coordinating care related to the above assessment and plan.  Asencion Gowda, NP  Please contact Palliative Medicine Team phone at [phone] for questions and concerns.

## 2018-10-16 ENCOUNTER — Encounter: Payer: Self-pay | Admitting: *Deleted

## 2018-10-16 ENCOUNTER — Inpatient Hospital Stay: Payer: Medicare Other | Admitting: Anesthesiology

## 2018-10-16 ENCOUNTER — Encounter
Admission: EM | Disposition: A | Discharge status: Skilled Nursing Facility | Payer: Self-pay | Source: Home / Self Care | Attending: Internal Medicine

## 2018-10-16 DIAGNOSIS — L89154 Pressure ulcer of sacral region, stage 4: Secondary | ICD-10-CM

## 2018-10-16 HISTORY — PX: INCISION AND DRAINAGE OF WOUND: SHX1803

## 2018-10-16 HISTORY — PX: APPLICATION OF WOUND VAC: SHX5189

## 2018-10-16 LAB — GLUCOSE, CAPILLARY
Glucose-Capillary: 129 mg/dL — ABNORMAL HIGH (ref 70–99)
Glucose-Capillary: 121 mg/dL — ABNORMAL HIGH (ref 70–99)
Glucose-Capillary: 136 mg/dL — ABNORMAL HIGH (ref 70–99)
Glucose-Capillary: 196 mg/dL — ABNORMAL HIGH (ref 70–99)
Glucose-Capillary: 136 mg/dL — ABNORMAL HIGH (ref 70–99)

## 2018-10-16 SURGERY — IRRIGATION AND DEBRIDEMENT WOUND
Anesthesia: General

## 2018-10-16 MED ORDER — FENTANYL CITRATE (PF) 100 MCG/2ML IJ SOLN: 25.0000 ug | INTRAMUSCULAR | Status: DC | PRN

## 2018-10-16 MED ORDER — MAGNESIUM SULFATE 2 GM/50ML IV SOLN
2.0000 g | Freq: Once | INTRAVENOUS | Status: AC
  Administered 2018-10-16: 15:00:00 2 g via INTRAVENOUS
  Filled 2018-10-16: qty 50

## 2018-10-16 MED ORDER — ESMOLOL HCL 100 MG/10ML IV SOLN
INTRAVENOUS | Status: DC | PRN
  Administered 2018-10-16 (×3): 10 mg via INTRAVENOUS

## 2018-10-16 MED ORDER — ONDANSETRON HCL 4 MG/2ML IJ SOLN: 4.0000 mg | Freq: Once | INTRAMUSCULAR | Status: DC | PRN

## 2018-10-16 MED ORDER — FENTANYL CITRATE (PF) 100 MCG/2ML IJ SOLN
INTRAMUSCULAR | Status: AC
  Filled 2018-10-16: qty 2

## 2018-10-16 MED ORDER — BUPIVACAINE HCL (PF) 0.5 % IJ SOLN
INTRAMUSCULAR | Status: AC
  Filled 2018-10-16: qty 30

## 2018-10-16 MED ORDER — BUPIVACAINE HCL (PF) 0.5 % IJ SOLN
INTRAMUSCULAR | Status: DC | PRN
  Administered 2018-10-16: 10 mL

## 2018-10-16 MED ORDER — PROPOFOL 10 MG/ML IV BOLUS
INTRAVENOUS | Status: DC | PRN
  Administered 2018-10-16: 20 mg via INTRAVENOUS

## 2018-10-16 MED ORDER — PROPOFOL 10 MG/ML IV BOLUS
INTRAVENOUS | Status: AC
  Filled 2018-10-16: qty 20

## 2018-10-16 MED ORDER — LIDOCAINE HCL 1 % IJ SOLN
INTRAMUSCULAR | Status: DC | PRN
  Administered 2018-10-16: 10 mL

## 2018-10-16 MED ORDER — LIDOCAINE HCL (PF) 1 % IJ SOLN
INTRAMUSCULAR | Status: AC
  Filled 2018-10-16: qty 30

## 2018-10-16 MED ORDER — CHLORHEXIDINE GLUCONATE CLOTH 2 % EX PADS: 6.0000 | MEDICATED_PAD | Freq: Once | CUTANEOUS | Status: DC

## 2018-10-16 MED ORDER — PROPOFOL 500 MG/50ML IV EMUL
INTRAVENOUS | Status: DC | PRN
  Administered 2018-10-16: 50 ug/kg/min via INTRAVENOUS

## 2018-10-16 MED ORDER — FENTANYL CITRATE (PF) 100 MCG/2ML IJ SOLN
INTRAMUSCULAR | Status: DC | PRN
  Administered 2018-10-16: 25 ug via INTRAVENOUS
  Administered 2018-10-16: 50 ug via INTRAVENOUS
  Administered 2018-10-16: 25 ug via INTRAVENOUS

## 2018-10-16 MED ORDER — ACETAMINOPHEN 500 MG PO TABS: 1000.0000 mg | ORAL_TABLET | ORAL | Status: DC

## 2018-10-16 MED ORDER — METOPROLOL TARTRATE 5 MG/5ML IV SOLN
INTRAVENOUS | Status: DC | PRN
  Administered 2018-10-16 (×2): 1 mg via INTRAVENOUS

## 2018-10-16 MED ORDER — POTASSIUM PHOSPHATE MONOBASIC 500 MG PO TABS
1000.0000 mg | ORAL_TABLET | ORAL | Status: AC
  Administered 2018-10-16 – 2018-10-17 (×2): 1000 mg via ORAL
  Filled 2018-10-16 (×2): qty 2

## 2018-10-16 MED ORDER — SODIUM CHLORIDE 0.9 % IV SOLN
1.0000 g | Freq: Once | INTRAVENOUS | Status: AC
  Administered 2018-10-16: 1 g via INTRAVENOUS
  Filled 2018-10-16: qty 10

## 2018-10-16 MED ORDER — PROPOFOL 500 MG/50ML IV EMUL
INTRAVENOUS | Status: AC
  Filled 2018-10-16: qty 50

## 2018-10-16 SURGICAL SUPPLY — 20 items
BLADE CLIPPER SURG (BLADE) ×2 IMPLANT
BLADE SURG 15 STRL LF DISP TIS (BLADE) ×1 IMPLANT
BLADE SURG 15 STRL SS (BLADE) ×1
BRUSH SCRUB EZ  4% CHG (MISCELLANEOUS)
BRUSH SCRUB EZ 4% CHG (MISCELLANEOUS) ×1 IMPLANT
CANISTER SUCT 3000ML PPV (MISCELLANEOUS) ×2 IMPLANT
COVER WAND RF STERILE (DRAPES) ×2 IMPLANT
DRAPE LAPAROTOMY 77X122 PED (DRAPES) ×2 IMPLANT
DRSG VAC ATS MED SENSATRAC (GAUZE/BANDAGES/DRESSINGS) ×1 IMPLANT
ELECT REM PT RETURN 9FT ADLT (ELECTROSURGICAL) ×2
ELECTRODE REM PT RTRN 9FT ADLT (ELECTROSURGICAL) ×1 IMPLANT
GLOVE BIO SURGEON STRL SZ7 (GLOVE) ×2 IMPLANT
GOWN STRL REUS W/ TWL LRG LVL3 (GOWN DISPOSABLE) ×2 IMPLANT
GOWN STRL REUS W/TWL LRG LVL3 (GOWN DISPOSABLE) ×2
NEEDLE HYPO 22GX1.5 SAFETY (NEEDLE) ×2 IMPLANT
NS IRRIG 1000ML POUR BTL (IV SOLUTION) ×2 IMPLANT
PACK BASIN MINOR ARMC (MISCELLANEOUS) ×2 IMPLANT
SOL PREP PVP 2OZ (MISCELLANEOUS) ×2
SOLUTION PREP PVP 2OZ (MISCELLANEOUS) ×1 IMPLANT
SPONGE LAP 18X18 RF (DISPOSABLE) ×2 IMPLANT

## 2018-10-16 NOTE — Progress Notes (Addendum)
[place] [facility] [place] NAME: Monica [name]    MR#:  [id number]  DATE OF BIRTH:  1930/05/24  SUBJECTIVE:  CHIEF COMPLAINT:   Chief Complaint  Patient presents with  . Failure To Thrive   -Patient just had surgical debridement of her sacral wound.  Feels much better.  Complains of some pain.  REVIEW OF SYSTEMS:  Review of Systems  Constitutional: Positive for malaise/fatigue. Negative for chills and fever.  HENT: Negative for congestion, ear discharge, hearing loss and nosebleeds.   Eyes: Negative for blurred vision and double vision.  Respiratory: Negative for cough, shortness of breath and wheezing.   Cardiovascular: Negative for chest pain, palpitations and leg swelling.  Gastrointestinal: Negative for abdominal pain, constipation, diarrhea, nausea and vomiting.  Genitourinary: Negative for dysuria.  Musculoskeletal: Negative for myalgias.  Neurological: Negative for dizziness, seizures and headaches.  Psychiatric/Behavioral: Positive for depression.       Some confusion    DRUG ALLERGIES:  No Known Allergies  VITALS:  Blood pressure (!) 142/96, pulse 72, temperature (!) 97.5 F (36.4 C), temperature source Oral, resp. rate 20, height 5\' 7"  (1.702 m), weight 84.4 kg, SpO2 100 %.  PHYSICAL EXAMINATION:  Physical Exam   GENERAL:  85 y.o.-year-old patient lying in the bed with no acute distress.  EYES: Pupils equal, round, reactive to light and accommodation. No scleral icterus. Extraocular muscles intact.  HEENT: Head atraumatic, normocephalic. Oropharynx and nasopharynx clear.  NECK:  Supple, no jugular venous distention. No thyroid enlargement, no tenderness.  LUNGS: Normal breath sounds bilaterally, no wheezing, rales,rhonchi or crepitation. No use of accessory muscles of respiration. Decreased bibasilar breath sounds CARDIOVASCULAR: S1, S2 normal. No  rubs, or gallops. 2/6 systolic murmur present ABDOMEN: Soft, nontender,  nondistended. Bowel sounds present. No organomegaly or mass.  EXTREMITIES: No pedal edema, cyanosis, or clubbing.  NEUROLOGIC: Cranial nerves II through XII are intact. Muscle strength 5/5 in all extremities. Sensation intact. Gait not checked.  PSYCHIATRIC: The patient is alert and oriented x 2-3. Fluctuating mental status SKIN: No obvious rash, lesion, or ulcer.        LABORATORY PANEL:   CBC Recent Labs  Lab 10/15/18 0338  WBC 9.3  HGB 10.2*  HCT 33.5*  PLT 289   ------------------------------------------------------------------------------------------------------------------  Chemistries  Recent Labs  Lab 10/14/18 0424 10/15/18 0338 10/15/18 1844  NA 143 145  --   K 4.3 3.8  --   CL 118* 122*  --   CO2 17* 15*  --   GLUCOSE 150* 119*  --   BUN 59* 40*  --   CREATININE 1.27* 1.06*  --   CALCIUM 9.1 8.1*  --   MG  --   --  1.4*  AST 17  --   --   ALT 22  --   --   ALKPHOS 93  --   --   BILITOT 0.7  --   --    ------------------------------------------------------------------------------------------------------------------  Cardiac Enzymes Recent Labs  Lab 10/12/18 1715  TROPONINI <0.03   ------------------------------------------------------------------------------------------------------------------  RADIOLOGY:  Dg Abd 1 View  Result Date: 10/14/2018 CLINICAL DATA:  Vomiting. EXAM: ABDOMEN - 1 VIEW COMPARISON:  None. FINDINGS: The bowel gas pattern is normal. No radio-opaque calculi or other significant radiographic abnormality are seen. Convex right lumbar scoliosis and multilevel degenerative change noted. IMPRESSION: No acute abnormality. Electronically Signed   By: Inge Rise M.D.   On: 10/14/2018 14:12    EKG:  Orders placed or performed during the hospital encounter of 10/12/18  . EKG 12-Lead  . EKG 12-Lead    ASSESSMENT AND PLAN:   85 year old female with past medical history of hypertension, hyperlipidemia, diabetes, COPD, history  of B12 deficiency who presented to the hospital due to generalized weakness and difficulty ambulating and noted to have a UTI and multiple pressure sores.  1.  Generalized weakness/difficulty ambulating-secondary to deconditioning and also possible underlying UTI. - Patient was given oral fosfomycin for the UTI in the ER, finished  IV ceftriaxone now.  -   urine cultures with strep viridans - Appreciate physical therapy evaluation and patient will likely need short-term rehab.  2.  Pressure ulcers- patient has multiple pressure ulcers with stage I and II on the left and right ankle respectively and stage III-IV on the sacrum. - Seen by wound remotely and the recommend continued local wound care and possible debridement of the sacral ulcer. - Appreciate surgical consult.  Patient had surgical debridement of her sacral wound on 10/16/2018  3.  Diabetes type 2 without complication-continue sliding scale insulin.  Follow blood sugars.  4.  Depression-continue Seroquel, Lamictal, Zoloft. Appreciate psych consult.  5. DVT Prophylaxis- Heparin SQ  Possible discharge to rehab in 1 to 2 days Appreciate palliative care input.  Patient is a full code for now.  Will follow up with palliative as outpatient  All the records are reviewed and case discussed with Care Management/Social Workerr. Management plans discussed with the patient, family and they are in agreement.  CODE STATUS: Full Code  TOTAL TIME TAKING CARE OF THIS PATIENT: 38 minutes.   POSSIBLE D/C IN 2-3 DAYS, DEPENDING ON CLINICAL CONDITION.   Gladstone Lighter M.D on 10/16/2018 at 1:01 PM  Between 7am to 6pm - Pager - [phone]  After 6pm go to [email or web address] - password [email or web address] [email or web address]  Office  [phone]  CC: Primary care physician; [name], MD

## 2018-10-16 NOTE — Care Management Important Message (Signed)
Important Message  Patient Details  Name: Monica [name] MRN: [id number] Date of Birth: 04-29-1930   Medicare Important Message Given:  Yes    [name] 10/16/2018, 12:40 PM

## 2018-10-16 NOTE — Anesthesia Preprocedure Evaluation (Signed)
Anesthesia Evaluation  Patient identified by MRN, date of birth, ID band Patient awake    Reviewed: Allergy & Precautions, H&P , NPO status , Patient's Chart, lab work & pertinent test results, reviewed documented beta blocker date and time   History of Anesthesia Complications Negative for: history of anesthetic complications  Airway Mallampati: II  TM Distance: >3 FB Neck ROM: full    Dental  (+) Dental Advidsory Given   Pulmonary neg shortness of breath, COPD, neg recent URI, former smoker,           Cardiovascular Exercise Tolerance: Good hypertension, (-) angina(-) CAD, (-) Past MI, (-) Cardiac Stents and (-) CABG (-) dysrhythmias (-) Valvular Problems/Murmurs     Neuro/Psych PSYCHIATRIC DISORDERS Anxiety Depression negative neurological ROS     GI/Hepatic negative GI ROS, Neg liver ROS,   Endo/Other  negative endocrine ROSdiabetes  Renal/GU CRFRenal disease  negative genitourinary   Musculoskeletal   Abdominal   Peds  Hematology negative hematology ROS (+)   Anesthesia Other Findings Past Medical History: No date: B12 deficiency No date: COPD (chronic obstructive pulmonary disease) (HCC) No date: Diabetes mellitus No date: H/O: Bell's palsy No date: Hyperlipidemia No date: Hypertension April 2011: Lung mass     Comment:  s/p biopsy, no cancer cells found, Repeat CT Dec 2012               unchanged   Reproductive/Obstetrics negative OB ROS                             Anesthesia Physical Anesthesia Plan  ASA: III  Anesthesia Plan: General   Post-op Pain Management:    Induction: Intravenous  PONV Risk Score and Plan: 3 and Propofol infusion and TIVA  Airway Management Planned: Simple Face Mask and Natural Airway  Additional Equipment:   Intra-op Plan:   Post-operative Plan:   Informed Consent: I have reviewed the patients History and Physical, chart, labs and  discussed the procedure including the risks, benefits and alternatives for the proposed anesthesia with the patient or authorized representative who has indicated his/her understanding and acceptance.     Dental Advisory Given  Plan Discussed with: Anesthesiologist, CRNA and Surgeon  Anesthesia Plan Comments:         Anesthesia Quick Evaluation

## 2018-10-16 NOTE — OR Nursing (Signed)
From inpt room to preop, alert and oriented, Dr. Rosana Hoes in to discuss surgery with patient, consent obtained.  NPO status confirmed by floor staff with report.

## 2018-10-16 NOTE — Anesthesia Postprocedure Evaluation (Signed)
Anesthesia Post Note  Patient: Monica [name]  Procedure(s) Performed: IRRIGATION AND DEBRIDEMENT WOUND (N/A ) APPLICATION OF WOUND VAC (N/A )  Patient location during evaluation: PACU Anesthesia Type: General Level of consciousness: awake and alert Pain management: pain level controlled Vital Signs Assessment: post-procedure vital signs reviewed and stable Respiratory status: spontaneous breathing, nonlabored ventilation, respiratory function stable and patient connected to nasal cannula oxygen Cardiovascular status: blood pressure returned to baseline and stable Postop Assessment: no apparent nausea or vomiting Anesthetic complications: no     Last Vitals:  Vitals:   10/16/18 1117 10/16/18 1155  BP: (!) 157/74 (!) 142/96  Pulse: 72   Resp: (!) 24 20  Temp:  (!) 36.4 C  SpO2: 99% 100%    Last Pain:  Vitals:   10/16/18 1155  TempSrc: Oral  PainSc:                  Martha Clan

## 2018-10-16 NOTE — Plan of Care (Signed)
  Problem: Urinary Elimination: Goal: Signs and symptoms of infection will decrease Outcome: Progressing   

## 2018-10-16 NOTE — Op Note (Addendum)
SURGICAL OPERATIVE REPORT   DATE OF PROCEDURE: 10/16/2018  ATTENDING Surgeon(s): [name], MD  ANESTHESIA: MAC  PRE-OPERATIVE DIAGNOSIS: Unstagable sacral-coccygeal pressure necrosis (icd-10's: L89.150)   POST-OPERATIVE DIAGNOSIS: Stage 4 sacral-coccygeal pressure necrosis (icd-10's: L89.154)   PROCEDURE(S):  1.) Sharp excisional debridement of sacral-coccygeal necrotic soft tissue to bone (cpt's: 56387, 56433 x5) 2.) Application of sacral-coccygeal negative pressure wound VAC (cpt: 29518)  INTRAOPERATIVE FINDINGS: 12 cm (craniocaudal, including 2 cm of superior undermining) x 10.5 cm (medial-lateral) x 3 cm (deep) sacral-coccygeal pressure necrosis wound comprised of necrotic skin, subcutaneous fat, muscle, fascia, and periosteum, which were excised where indicated using primarily scalpel and electrocautery  INTRAVENOUS FLUIDS: 700 mL crystalloid    ESTIMATED BLOOD LOSS: Minimal (<20 mL)    URINE OUTPUT: No Foley catheter    SPECIMENS: Deep tissue culture (aerobic, anaerobic)   IMPLANTS: None   DRAINS: Sacral-coccygeal negative pressure wound VAC (bridged to Left side to minimize pressure when supine)   COMPLICATIONS: None apparent   CONDITION AT END OF PROCEDURE: Hemodynamically stable and awake   DISPOSITION OF PATIENT: PACU   INDICATIONS FOR PROCEDURE:  Patient is an 85 y.o. female with progressively more limited mobility over the past few months and who has been essentially bed-bound over the past 2 weeks, attributed to spinal stenosis, who presented to [facility] ED 3 days ago for failure to thrive and delirium. Workup was found to be significant for UTI and an unstagable sacral-coccygeal pressure wound, for which surgery was consulted. Debridement was advised by Dr. Dahlia Byes, but this was declined by patient and her family until decision was made yesterday to proceed. Due to anesthesia and OR availability, this procedure was  scheduled for today. All risks, benefits, and alternatives to excisional debridement of patient's sacral-coccygeal wound were discussed with the patient, all of patient's questions were answered to her expressed satisfaction, and informed consent was obtained and documented.   DETAILS OF PROCEDURE: Patient was brought to the operating suite and was appropriately identified. Operative site was prepped and draped in the usual sterile fashion with betadine due to her wound, and following a brief time out, the wound was sharply debrided using scalpel and then electrocautery. Necrosis extended to and included patient's periosteum/bone. Hemostasis was achieved using selective electrocautery. Surrounding skin was then cleaned and dried, and a sterile negative pressure wound VAC was trimmed to appropriate dimensions and applied with an appropriate seal achieved and confirmed. Patient was then safely able to be awakened and transferred to PACU for further post-operative monitoring and care. Surgical procedure and patient's condition were discussed following surgery with patient's daughter, [name], via phone. All of patient's and her daughter's questions were answered to their expressed satisfaction and both expressed understanding accordingly.   I was present for all aspects of the above procedure, and no operative complications were apparent.  Before:   After:   Wound VAC:

## 2018-10-16 NOTE — Anesthesia Procedure Notes (Signed)
Date/Time: 10/16/2018 8:43 AM Performed by: Nelda Marseille, CRNA Pre-anesthesia Checklist: Patient identified, Emergency Drugs available, Suction available, Patient being monitored and Timeout performed Oxygen Delivery Method: Simple face mask

## 2018-10-16 NOTE — Plan of Care (Addendum)
  PMT note:  Patient is currently in the OR at this time for debridment. Unable to reach [place]. Spoke with daughter Joseph Art. No questions or concerns at this time. She states they are being updated.   ADDENDUM: Patient returned from OR and is conversive. She denies pain.

## 2018-10-16 NOTE — Interval H&P Note (Signed)
History and Physical Interval Note:  10/16/2018 8:08 AM  Monica [name]  has presented today for surgery, with the diagnosis of decubitus ulcer.  The various methods of treatment have been discussed with the patient and family. After consideration of risks, benefits and other options for treatment, the patient has consented to  Procedure(s): IRRIGATION AND DEBRIDEMENT WOUND (N/A) APPLICATION OF WOUND VAC (N/A) as a surgical intervention.  The patient's history has been reviewed, patient examined, no change in status, stable for surgery.  I have reviewed the patient's chart and labs.  Questions were answered to the patient's satisfaction.     [name]

## 2018-10-16 NOTE — Consult Note (Signed)
Patient is in surgery today.  She was seen yesterday and psychiatric admission not recommended, medication recommendations were made.  Waylan Boga, PMHNP

## 2018-10-16 NOTE — Anesthesia Post-op Follow-up Note (Signed)
Anesthesia QCDR form completed.        

## 2018-10-16 NOTE — Progress Notes (Signed)
Physical Therapy Treatment Patient Details Name: Monica [name] MRN: [id number] DOB: [birth year] Today's Date: 10/16/2018    History of Present Illness 85 y.o. female with a past medical history of COPD, diabetes, hypertension, hyperlipidemia, CKD, presents to the emergency department for generalized weakness.  Apparently she has been unable to stand/walk for ~2 weeks.    PT Comments    Pt much more awake than previous PT session despite having I&D this AM.  She was able to do some exercises supine in bed with AROM and/or light resistance.  She struggled with bed mobility tasks, but once assisted to sitting was able to maintain sitting balance with multiple reps of reaching out of BOS toward moving targets.  Pt states that she is not able to ambulate at baseline and given her limited ROM in knees (OA) this appears likely, transfers/ambulation not attempted this date.     Follow Up Recommendations  SNF     Equipment Recommendations  None recommended by PT    Recommendations for Other Services       Precautions / Restrictions Precautions Precautions: Fall Restrictions Weight Bearing Restrictions: No    Mobility  Bed Mobility Overal bed mobility: Needs Assistance Bed Mobility: Supine to Sit;Sit to Supine     Supine to sit: Max assist Sit to supine: Max assist   General bed mobility comments: Pt struggled to help much at all with mobility  Transfers                 General transfer comment: Pt unable to even attempt standing  Ambulation/Gait                 Stairs             Wheelchair Mobility    Modified Rankin (Stroke Patients Only)       Balance Overall balance assessment: Needs assistance Sitting-balance support: Bilateral upper extremity supported;Feet supported Sitting balance-Leahy Scale: Fair Sitting balance - Comments: Pt able to do some minimal sitting balance activities, reaching minimally out of BOS                                      Cognition Arousal/Alertness: Awake/alert Behavior During Therapy: WFL for tasks assessed/performed Overall Cognitive Status: No family/caregiver present to determine baseline cognitive functioning                                        Exercises General Exercises - Lower Extremity Ankle Circles/Pumps: AAROM;10 reps Quad Sets: AROM;10 reps Heel Slides: Strengthening;10 reps(with resisted leg ext) Hip ABduction/ADduction: Strengthening;10 reps Straight Leg Raises: AAROM;10 reps    General Comments        Pertinent Vitals/Pain Pain Assessment: 0-10 Pain Score: 4  Pain Location: sacral ulcer    Home Living                      Prior Function            PT Goals (current goals can now be found in the care plan section) Progress towards PT goals: Progressing toward goals    Frequency    Min 2X/week      PT Plan Current plan remains appropriate    Co-evaluation              AM-PAC  PT "6 Clicks" Mobility   Outcome Measure  Help needed turning from your back to your side while in a flat bed without using bedrails?: A Lot Help needed moving from lying on your back to sitting on the side of a flat bed without using bedrails?: A Lot Help needed moving to and from a bed to a chair (including a wheelchair)?: Total Help needed standing up from a chair using your arms (e.g., wheelchair or bedside chair)?: Total Help needed to walk in hospital room?: Total Help needed climbing 3-5 steps with a railing? : Total 6 Click Score: 8    End of Session Equipment Utilized During Treatment: Gait belt Activity Tolerance: Patient limited by fatigue Patient left: with bed alarm set;with call bell/phone within reach Nurse Communication: Mobility status PT Visit Diagnosis: Muscle weakness (generalized) (M62.81);Difficulty in walking, not elsewhere classified (R26.2)     Time: 6010-9323 PT Time Calculation (min) (ACUTE ONLY): 31  min  Charges:  $Therapeutic Exercise: 8-22 mins $Therapeutic Activity: 8-22 mins                     Kreg Shropshire, DPT 10/16/2018, 5:54 PM

## 2018-10-16 NOTE — Transfer of Care (Signed)
Immediate Anesthesia Transfer of Care Note  Patient: Monica [name]  Procedure(s) Performed: IRRIGATION AND DEBRIDEMENT WOUND (N/A ) APPLICATION OF WOUND VAC (N/A )  Patient Location: PACU  Anesthesia Type:General  Level of Consciousness: awake, alert  and oriented  Airway & Oxygen Therapy: Patient Spontanous Breathing and Patient connected to face mask oxygen  Post-op Assessment: Report given to RN and Post -op Vital signs reviewed and stable  Post vital signs: Reviewed and stable  Last Vitals:  Vitals Value Taken Time  BP    Temp    Pulse 69 10/16/2018 10:16 AM  Resp 26 10/16/2018 10:16 AM  SpO2 100 % 10/16/2018 10:16 AM  Vitals shown include unvalidated device data.  Last Pain:  Vitals:   10/16/18 0755  TempSrc:   PainSc: 0-No pain      Patients Stated Pain Goal: 0 (32/25/67 2091)  Complications: No apparent anesthesia complications

## 2018-10-17 ENCOUNTER — Inpatient Hospital Stay: Payer: Medicare Other

## 2018-10-17 ENCOUNTER — Encounter: Payer: Self-pay | Admitting: Surgery

## 2018-10-17 ENCOUNTER — Other Ambulatory Visit: Payer: Self-pay | Admitting: Internal Medicine

## 2018-10-17 LAB — MAGNESIUM: Magnesium: 1.7 mg/dL (ref 1.7–2.4)

## 2018-10-17 LAB — BASIC METABOLIC PANEL
Anion gap: 6 (ref 5–15)
BUN: 20 mg/dL (ref 8–23)
CO2: 18 mmol/L — ABNORMAL LOW (ref 22–32)
Calcium: 7.5 mg/dL — ABNORMAL LOW (ref 8.9–10.3)
Chloride: 119 mmol/L — ABNORMAL HIGH (ref 98–111)
Creatinine, Ser: 0.78 mg/dL (ref 0.44–1.00)
GFR calc Af Amer: >60 mL/min (ref >60)
GFR calc non Af Amer: >60 mL/min (ref >60)
Glucose, Bld: 139 mg/dL — ABNORMAL HIGH (ref 70–99)
Potassium: 3.3 mmol/L — ABNORMAL LOW (ref 3.5–5.1)
Sodium: 143 mmol/L (ref 135–145)

## 2018-10-17 LAB — GLUCOSE, CAPILLARY
Glucose-Capillary: 115 mg/dL — ABNORMAL HIGH (ref 70–99)
Glucose-Capillary: 158 mg/dL — ABNORMAL HIGH (ref 70–99)
Glucose-Capillary: 149 mg/dL — ABNORMAL HIGH (ref 70–99)
Glucose-Capillary: 135 mg/dL — ABNORMAL HIGH (ref 70–99)

## 2018-10-17 LAB — PHOSPHORUS: Phosphorus: 2 mg/dL — ABNORMAL LOW (ref 2.5–4.6)

## 2018-10-17 MED ORDER — POTASSIUM CHLORIDE CRYS ER 20 MEQ PO TBCR
40.0000 meq | EXTENDED_RELEASE_TABLET | Freq: Once | ORAL | Status: AC
  Administered 2018-10-17: 09:00:00 40 meq via ORAL
  Filled 2018-10-17: qty 2

## 2018-10-17 MED ORDER — VANCOMYCIN HCL 10 G IV SOLR
1750.0000 mg | Freq: Once | INTRAVENOUS | Status: AC
  Administered 2018-10-17: 1750 mg via INTRAVENOUS
  Filled 2018-10-17: qty 1750

## 2018-10-17 MED ORDER — MAGNESIUM SULFATE 2 GM/50ML IV SOLN
2.0000 g | Freq: Once | INTRAVENOUS | Status: AC
  Administered 2018-10-17: 09:00:00 2 g via INTRAVENOUS
  Filled 2018-10-17: qty 50

## 2018-10-17 MED ORDER — VANCOMYCIN HCL 10 G IV SOLR
1250.0000 mg | INTRAVENOUS | Status: DC
  Administered 2018-10-18 – 2018-10-19 (×2): 1250 mg via INTRAVENOUS
  Filled 2018-10-17 (×3): qty 1250

## 2018-10-17 MED ORDER — DOCUSATE SODIUM 100 MG PO CAPS
100.0000 mg | ORAL_CAPSULE | Freq: Two times a day (BID) | ORAL | Status: DC
  Administered 2018-10-18 – 2018-10-20 (×3): 100 mg via ORAL
  Filled 2018-10-17 (×4): qty 1

## 2018-10-17 MED ORDER — OXYCODONE HCL 5 MG PO TABS
5.0000 mg | ORAL_TABLET | Freq: Four times a day (QID) | ORAL | Status: DC | PRN
  Filled 2018-10-17: qty 1

## 2018-10-17 MED ORDER — SODIUM CHLORIDE 0.9 % IV SOLN
1.0000 g | INTRAVENOUS | Status: DC
  Administered 2018-10-17 – 2018-10-20 (×4): 1 g via INTRAVENOUS
  Filled 2018-10-17 (×4): qty 1

## 2018-10-17 MED ORDER — SERTRALINE HCL 50 MG PO TABS
50.0000 mg | ORAL_TABLET | Freq: Every day | ORAL | Status: DC
  Administered 2018-10-18 – 2018-10-20 (×3): 50 mg via ORAL
  Filled 2018-10-17 (×3): qty 1

## 2018-10-17 MED ORDER — GADOBUTROL 1 MMOL/ML IV SOLN
8.0000 mL | Freq: Once | INTRAVENOUS | Status: AC | PRN
  Administered 2018-10-17: 18:00:00 8 mL via INTRAVENOUS

## 2018-10-17 NOTE — Progress Notes (Signed)
Patient ID: Monica [name], female   DOB: [birth year], 85 y.o.   MRN: [id number]  [facility] PROGRESS NOTE  Monica [name] NAT:[id number] DOB: Oct 25, 1929 DOA: 10/12/2018 PCP: [name], MD  HPI/Subjective: Patient feeling okay.  States she has not walked in years secondary to knee pain.  Daughter states that she also has low back pain.  Daughter states the main issue is that she is in so much pain.  Patient states she has a poor appetite.  Objective: Vitals:   10/16/18 1948 10/17/18 0504  BP: 134/75 (!) 152/76  Pulse: 93 75  Resp: 18 16  Temp: 98.5 F (36.9 C) 97.7 F (36.5 C)  SpO2: 100% 99%    Filed Weights   10/14/18 0455 10/15/18 0500 10/16/18 0550  Weight: 82.6 kg 101.2 kg 84.4 kg    ROS: Review of Systems  Constitutional: Negative for chills and fever.  Eyes: Negative for blurred vision.  Respiratory: Negative for cough and shortness of breath.   Cardiovascular: Negative for chest pain.  Gastrointestinal: Negative for abdominal pain, constipation, diarrhea, nausea and vomiting.  Genitourinary: Negative for dysuria.  Musculoskeletal: Positive for back pain and joint pain.  Neurological: Negative for dizziness and headaches.   Exam: Physical Exam  Constitutional: She is oriented to person, place, and time.  HENT:  Nose: No mucosal edema.  Mouth/Throat: No oropharyngeal exudate or posterior oropharyngeal edema.  Eyes: Pupils are equal, round, and reactive to light. Conjunctivae, EOM and lids are normal.  Neck: No JVD present. Carotid bruit is not present. No edema present. No thyroid mass and no thyromegaly present.  Cardiovascular: S1 normal and S2 normal. Exam reveals no gallop.  No murmur heard. Pulses:      Dorsalis pedis pulses are 2+ on the right side and 2+ on the left side.  Respiratory: No respiratory distress. She has no wheezes. She has no rhonchi. She has no rales.  GI: Soft. Bowel sounds are normal. There is no abdominal tenderness.   Musculoskeletal:     Right ankle: She exhibits no swelling.     Left ankle: She exhibits no swelling.  Lymphadenopathy:    She has no cervical adenopathy.  Neurological: She is alert and oriented to person, place, and time. No cranial nerve deficit.  Skin: Skin is warm. Nails show no clubbing.  Wound VAC on buttock with secondary excoriations on the buttock  Psychiatric: She has a normal mood and affect.      Data Reviewed: Basic Metabolic Panel: Recent Labs  Lab 10/12/18 1715 10/12/18 2019 10/14/18 0424 10/15/18 0338 10/15/18 1844 10/17/18 0527  NA 137 138 143 145  --  143  K 5.7* 5.1 4.3 3.8  --  3.3*  CL 108 111 118* 122*  --  119*  CO2 18* 15* 17* 15*  --  18*  GLUCOSE 224* 159* 150* 119*  --  139*  BUN 94* 94* 59* 40*  --  20  CREATININE 1.78* 1.77* 1.27* 1.06*  --  0.78  CALCIUM 10.2 9.5 9.1 8.1*  --  7.5*  MG  --   --   --   --  1.4* 1.7  PHOS  --   --   --   --  1.9* 2.0*   Liver Function Tests: Recent Labs  Lab 10/12/18 1715 10/14/18 0424  AST 27 17  ALT 25 22  ALKPHOS 114 93  BILITOT 0.5 0.7  PROT 7.7 6.5  ALBUMIN 2.5* 2.3*   CBC: Recent Labs  Lab 10/12/18 1715 10/14/18 0424 10/14/18 1312 10/15/18 0338  WBC 13.8* 11.2*  --  9.3  NEUTROABS 10.6*  --   --   --   HGB 14.1 11.8* 12.0 10.2*  HCT 45.4 39.0  --  33.5*  MCV 87.6 90.5  --  88.9  PLT 483* 351  --  289   Cardiac Enzymes: Recent Labs  Lab 10/12/18 1715  TROPONINI <0.03    CBG: Recent Labs  Lab 10/16/18 1152 10/16/18 1654 10/16/18 2109 10/17/18 0738 10/17/18 1156  GLUCAP 136* 196* 136* 115* 158*    Recent Results (from the past 240 hour(s))  Urine Culture     Status: Abnormal   Collection Time: 10/12/18  6:10 PM  Result Value Ref Range Status   Specimen Description   Final    URINE, RANDOM Performed at [facility], [address], [place], [place] [address]    Special Requests   Final    NONE Performed at [facility], [address]  [place], [place] [address]    Culture >=100,000 COLONIES/mL VIRIDANS STREPTOCOCCUS (A)  Final   Report Status 10/13/2018 FINAL  Final  Aerobic/Anaerobic Culture (surgical/deep wound)     Status: None (Preliminary result)   Collection Time: 10/16/18  8:58 AM  Result Value Ref Range Status   Specimen Description   Final    ULCER SACRAL Performed at [facility], [place] [address] [place], [place] [address]    Special Requests   Final    NONE Performed at [facility], [place], [place] [address]    Gram Stain NO WBC SEEN NO ORGANISMS SEEN   Final   Culture   Final    NO GROWTH 1 DAY Performed at [facility], [place] [address], [place], [place] [address]    Report Status PENDING  Incomplete      Scheduled Meds: . collagenase   Topical Daily  . docusate sodium  100 mg Oral BID  . enoxaparin (LOVENOX) injection  40 mg Subcutaneous Q24H  . insulin aspart  0-15 Units Subcutaneous TID WC  . insulin aspart  0-5 Units Subcutaneous QHS  . lamoTRIgine  50 mg Oral QHS  . multivitamin-lutein  1 capsule Oral Daily  . Ensure Max Protein  11 oz Oral BID BM  . sertraline  50 mg Oral Daily   Continuous Infusions: . cefTRIAXone (ROCEPHIN)  IV      Assessment/Plan:  1. Stage IV sacral decubitus down to the bone.  Case discussed with Dr. Rosana Hoes surgery that the patient had necrotic material taken out.  Since the necrotic material went down to the bone likely the patient has osteomyelitis.  We will get an MRI of the pelvis to evaluate.  Send off a sedimentation rate.  Empiric antibiotics with Rocephin and vancomycin for now.  Wound VAC will be continued.  Daughter interested in rehab.  I mentioned that this likely will not heal on its own and would likely need plastic surgery but I need to rule out infection first. 2. Heel decubiti.  Local wound care  3. patient has redness of the skin around the stage IV decubitus ulcer. 4. Type 2 diabetes mellitus on sliding scale  insulin.  Last A1c 6.5.  Diet controlled. 5. Depression on Zoloft and Lamictal 6. COVID-19 test ordered because the patient will likely go out to rehab.  Not because the patient has symptoms.  Code Status:     Code Status Orders  (  From admission, onward)         Start     Ordered   10/13/18 0458  Full code  Continuous     10/13/18 0457        Code Status History    This patient has a current code status but no historical code status.     Family Communication: Spoke with daughter on the phone Disposition Plan: To be determined  Consultants:  Psychiatry  General surgery  Antibiotics: -Rocephin and vancomycin Time spent: 30 minutes including ACP time  [employer]

## 2018-10-17 NOTE — Consult Note (Signed)
[facility] Consult   Reason for Consult:  Confusion and Depression Referring Physician:  Dr. Tressia Miners Patient Identification: Monica [name] MRN:  [id number] Principal Diagnosis: Weakness Diagnosis:  Principal Problem:   Weakness Active Problems:   Major depressive disorder, recurrent episode with anxious distress ([place])   Pressure injury of skin   Total Time spent with patient: 25 minutes  HPI:    Monica [name] is being seen for the first time by this provider.  She was initially seen by Dr. Barrington Ellison on October 15, 2018 due to depression and confusion.  Patient today is alert and oriented x3, converses well.  She denies any suicidal or homicidal plan, intent, drive, or preparation.  She reflects having visual hallucinations when one of her medications were increased.  According to the EMR PRN Seroquel has not been needed for agitation.  She did sleep well.  Tells me that her appetite is fair.  No verbal or physical aggression, according to the nurse.  Past Psychiatric History:   Depression, anxiety, delirium   Risk to Self:   Risk to Others:   Prior Inpatient Therapy:   Prior Outpatient Therapy:    Past Medical History:  Past Medical History:  Diagnosis Date  . B12 deficiency   . COPD (chronic obstructive pulmonary disease) ([place])   . Diabetes mellitus   . H/O: Bell's palsy   . Hyperlipidemia   . Hypertension   . Lung mass April 2011   s/p biopsy, no cancer cells found, Repeat CT Dec 2012 unchanged    Past Surgical History:  Procedure Laterality Date  . ABDOMINAL HYSTERECTOMY    . APPLICATION OF WOUND VAC N/A 10/16/2018   Procedure: APPLICATION OF WOUND VAC;  Surgeon: [name], MD;  Location: [facility] ORS;  Service: General;  Laterality: N/A;  . CHOLECYSTECTOMY    . INCISION AND DRAINAGE OF WOUND N/A 10/16/2018   Procedure: IRRIGATION AND DEBRIDEMENT WOUND;  Surgeon: [name], MD;  Location: [facility] ORS;  Service: General;  Laterality: N/A;  . LUNG BIOPSY   2012   for positive PET, negative biopsy for malignancy  . SPINE SURGERY  2001   Lumbar   Family History:  Family History  Problem Relation Age of Onset  . Cancer Mother 64       Breast  . Diabetes Mother   . Heart disease Father 54       Acute MI  . Heart disease Sister   . Arthritis Sister   . Heart disease Brother    Family Psychiatric  History:  Social History:  Patient lives with her husband, daughter and granddaughter.   Patient states that she is 85 years old and living with her mommy and daddy  Denies alcohol or drug use.  Does not have access to weapons.  Social History   Substance and Sexual Activity  Alcohol Use No     Social History   Substance and Sexual Activity  Drug Use No    Social History   Socioeconomic History  . Marital status: Widowed    Spouse name: Not on file  . Number of children: Not on file  . Years of education: Not on file  . Highest education level: Not on file  Occupational History  . Not on file  Social Needs  . Financial resource strain: Not very hard  . Food insecurity:    Worry: Patient refused    Inability: Patient refused  . Transportation needs:    Medical: Patient refused  Non-medical: Patient refused  Tobacco Use  . Smoking status: Former Smoker    Packs/day: 0.50    Years: 5.00    Pack years: 2.50    Types: Cigarettes  . Smokeless tobacco: Never Used  . Tobacco comment: quit approx 2016  Substance and Sexual Activity  . Alcohol use: No  . Drug use: No  . Sexual activity: Not Currently  Lifestyle  . Physical activity:    Days per week: Patient refused    Minutes per session: Patient refused  . Stress: Only a little  Relationships  . Social connections:    Talks on phone: Patient refused    Gets together: Patient refused    Attends religious service: Patient refused    Active member of club or organization: Patient refused    Attends meetings of clubs or organizations: Patient refused     Relationship status: Patient refused  Other Topics Concern  . Not on file  Social History Narrative  . Not on file   Additional Social History:    Allergies:  No Known Allergies  Labs:  Results for orders placed or performed during the hospital encounter of 10/12/18 (from the past 48 hour(s))  Glucose, capillary     Status: Abnormal   Collection Time: 10/15/18 11:53 AM  Result Value Ref Range   Glucose-Capillary 114 (H) 70 - 99 mg/dL  Glucose, capillary     Status: Abnormal   Collection Time: 10/15/18  4:36 PM  Result Value Ref Range   Glucose-Capillary 150 (H) 70 - 99 mg/dL  Magnesium     Status: Abnormal   Collection Time: 10/15/18  6:44 PM  Result Value Ref Range   Magnesium 1.4 (L) 1.7 - 2.4 mg/dL    Comment: Performed at [facility], [address], [place], [place] [address]  Phosphorus     Status: Abnormal   Collection Time: 10/15/18  6:44 PM  Result Value Ref Range   Phosphorus 1.9 (L) 2.5 - 4.6 mg/dL    Comment: Performed at [facility], [place] [place], [place] [address]  Glucose, capillary     Status: Abnormal   Collection Time: 10/15/18  8:53 PM  Result Value Ref Range   Glucose-Capillary 141 (H) 70 - 99 mg/dL  Glucose, capillary     Status: Abnormal   Collection Time: 10/16/18  7:43 AM  Result Value Ref Range   Glucose-Capillary 129 (H) 70 - 99 mg/dL  Aerobic/Anaerobic Culture (surgical/deep wound)     Status: None (Preliminary result)   Collection Time: 10/16/18  8:58 AM  Result Value Ref Range   Specimen Description      ULCER SACRAL Performed at [facility], [place] [address] [place], [place] [address]    Special Requests      NONE Performed at [facility], [place], [place] [address]    Gram Stain      NO WBC SEEN NO ORGANISMS SEEN Performed at [facility], [place] [address] [place], [place] [address]    Culture PENDING    Report Status PENDING   Glucose, capillary     Status:  Abnormal   Collection Time: 10/16/18 11:07 AM  Result Value Ref Range   Glucose-Capillary 121 (H) 70 - 99 mg/dL  Glucose, capillary     Status: Abnormal   Collection Time: 10/16/18 11:52 AM  Result Value Ref Range   Glucose-Capillary 136 (H) 70 - 99 mg/dL  Glucose, capillary  Status: Abnormal   Collection Time: 10/16/18  4:54 PM  Result Value Ref Range   Glucose-Capillary 196 (H) 70 - 99 mg/dL  Glucose, capillary     Status: Abnormal   Collection Time: 10/16/18  9:09 PM  Result Value Ref Range   Glucose-Capillary 136 (H) 70 - 99 mg/dL  Basic metabolic panel     Status: Abnormal   Collection Time: 10/17/18  5:27 AM  Result Value Ref Range   Sodium 143 135 - 145 mmol/L   Potassium 3.3 (L) 3.5 - 5.1 mmol/L   Chloride 119 (H) 98 - 111 mmol/L   CO2 18 (L) 22 - 32 mmol/L   Glucose, Bld 139 (H) 70 - 99 mg/dL   BUN 20 8 - 23 mg/dL   Creatinine, Ser 0.78 0.44 - 1.00 mg/dL   Calcium 7.5 (L) 8.9 - 10.3 mg/dL   GFR calc non Af Amer >60 >60 mL/min   GFR calc Af Amer >60 >60 mL/min   Anion gap 6 5 - 15    Comment: Performed at [facility], [address], [place], [place] [address]  Phosphorus     Status: Abnormal   Collection Time: 10/17/18  5:27 AM  Result Value Ref Range   Phosphorus 2.0 (L) 2.5 - 4.6 mg/dL    Comment: Performed at [facility], [address] [place], [place] [address]  Magnesium     Status: None   Collection Time: 10/17/18  5:27 AM  Result Value Ref Range   Magnesium 1.7 1.7 - 2.4 mg/dL    Comment: Performed at [facility], [address] [place], [place] [address]  Glucose, capillary     Status: Abnormal   Collection Time: 10/17/18  7:38 AM  Result Value Ref Range   Glucose-Capillary 115 (H) 70 - 99 mg/dL    Current Facility-Administered Medications  Medication Dose Route Frequency Provider Last Rate Last Dose  . acetaminophen (TYLENOL) tablet 650 mg  650 mg Oral Q6H PRN Harrie Foreman, MD   650 mg at 10/17/18 0023    Or  . acetaminophen (TYLENOL) suppository 650 mg  650 mg Rectal Q6H PRN Harrie Foreman, MD      . collagenase (SANTYL) ointment   Topical Daily Henreitta Leber, MD      . docusate sodium (COLACE) capsule 100 mg  100 mg Oral BID Harrie Foreman, MD   100 mg at 10/17/18 0900  . enoxaparin (LOVENOX) injection 40 mg  40 mg Subcutaneous Q24H Gladstone Lighter, MD   40 mg at 10/16/18 1453  . insulin aspart (novoLOG) injection 0-15 Units  0-15 Units Subcutaneous TID WC Harrie Foreman, MD   3 Units at 10/16/18 1720  . insulin aspart (novoLOG) injection 0-5 Units  0-5 Units Subcutaneous QHS Harrie Foreman, MD      . lamoTRIgine (LAMICTAL) tablet 50 mg  50 mg Oral QHS Henreitta Leber, MD   50 mg at 10/17/18 0023  . multivitamin-lutein (OCUVITE-LUTEIN) capsule 1 capsule  1 capsule Oral Daily Henreitta Leber, MD   1 capsule at 10/17/18 0900  . ondansetron (ZOFRAN) tablet 4 mg  4 mg Oral Q6H PRN Harrie Foreman, MD       Or  . ondansetron [facility]) injection 4 mg  4 mg Intravenous Q6H PRN Harrie Foreman, MD      . protein supplement (ENSURE MAX) liquid  11 oz Oral BID BM Sainani, Belia Heman, MD   11 oz  at 10/17/18 1000  . QUEtiapine (SEROQUEL) tablet 25 mg  25 mg Oral BID PRN Lavella Hammock, MD      . sertraline (ZOLOFT) tablet 50 mg  50 mg Oral Daily Henreitta Leber, MD   50 mg at 10/17/18 0900    Psychiatric Specialty Exam: Physical Exam  Nursing note and vitals reviewed. Constitutional: She appears well-developed and well-nourished. No distress.  HENT:  Head: Normocephalic and atraumatic.  Eyes: EOM are normal.  Neck: Normal range of motion.  Cardiovascular: Normal rate and regular rhythm.  Respiratory: Effort normal. No respiratory distress.  Musculoskeletal: Normal range of motion.  Neurological: She is alert.    Review of Systems  Psychiatric/Behavioral: Positive for depression. Negative for hallucinations, memory loss, substance abuse and suicidal ideas. The patient  is not nervous/anxious and does not have insomnia.   All other systems reviewed and are negative. Patient having fluctuating levels of consciousness  Blood pressure 140/73, pulse 76, temperature (!) 97.4 F (36.3 C), temperature source Oral, resp. rate 17, height 5\' 7"  (1.702 m), weight 101.2 kg, SpO2 99 %.Body mass index is 34.93 kg/m.  General Appearance: Fairly Groomed  Eye Contact:  Fair  Speech:  Clear and Coherent  Volume:  Normal  Mood: okay  Affect:  neutral  Thought Process:  Coherent  Orientation:  Full (Time, Place, and Person)  Thought Content:  Illogical  Suicidal Thoughts:  No  Homicidal Thoughts:  No  Memory:  Fair  Judgement:  Fair  Insight:  Present  Psychomotor Activity:  Decreased  Concentration:  Attention Span: Fair  Recall:  [employer] of Knowledge:  Fair  Language:  Good  Akathisia:  No  Handed:  Right  AIMS (if indicated):     Assets:  Desire for Improvement Financial Resources/Insurance Housing Social Support  ADL's:  Impaired  Cognition:  Impaired,  Mild  Sleep:   Adequate in hospital        Treatment Plan Summary: Medication management  No medication changes I do agree with Dr. Annye Rusk thoughts about her Lamictal.  Patient has chronic kidney disease and may want to eventually discontinue the ictal on an outpatient basis.  Patient does not meet inpatient criteria Spoke with medical provider Discussed case with staff and nursing   Disposition: Patient does not meet criteria for psychiatric inpatient admission.  Rulon Sera, MD 10/17/2018 11:49 AM

## 2018-10-17 NOTE — Progress Notes (Signed)
Patient ID: GEMA RINGOLD, female   DOB: [birth year], 85 y.o.   MRN: [id number]  ACP note  Spoke with patient at the bedside.  Spoke with the daughter on the phone (secondary to no visitor policy).  Patient is a full code  Patient was admitted with weakness, acute kidney injury.  The patient was found to have a stage IV decubitus ulcer.  The patient has been bedbound for a few years.  The patient's daughter states that pain is her major issue.  She has been on tramadol as outpatient.  The patient's daughter thinks that she would do better if her pain was under better control.  I mentioned that with a stage IV decubitus ulcer down to the bone we have to rule out infection.  Stage IV decubitus ulcers usually will not heal on their own and likely will need plastic surgery down the line and a skin flap.  Because she is bedbound these ulcers usually will get infected over time.  Because of the stage IV decubitus ulcer overall prognosis is poor.  Time spent on ACP discussion 17 minutes Dr Loletha Grayer

## 2018-10-17 NOTE — Consult Note (Signed)
Pharmacy Antibiotic Note  Monica [name] is a 85 y.o. female admitted on 10/12/2018 with wound infection.  Pharmacy has been consulted for Vancomycin dosing.  Plan: Will give loading dose of Vancomycin 1750mg  x 1, followed by:  Vancomycin 1250 mg IV Q 24 hrs. Goal AUC 400-550. Expected AUC: 455 SCr used: 0.8   Height: 5\' 7"  (170.2 cm) Weight: 186 lb (84.4 kg) IBW/kg (Calculated) : 61.6  Temp (24hrs), Avg:97.9 F (36.6 C), Min:97.5 F (36.4 C), Max:98.5 F (36.9 C)  Recent Labs  Lab 10/12/18 1715 10/12/18 2019 10/14/18 0424 10/15/18 0338 10/17/18 0527  WBC 13.8*  --  11.2* 9.3  --   CREATININE 1.78* 1.77* 1.27* 1.06* 0.78    Estimated Creatinine Clearance: 56.3 mL/min (by C-G formula based on SCr of 0.78 mg/dL).    No Known Allergies  Antimicrobials this admission: CTX 4/25 >> Vancomycin 4/25 >>  Dose adjustments this admission: None  Microbiology results: [facility] 4/24 >>  UCx 4/20 > [facility]  COVID 4/24 pending  Thank you for allowing pharmacy to be a part of this patient's care.  Lu Duffel, PharmD, BCPS Clinical Pharmacist 10/17/2018 3:19 PM

## 2018-10-17 NOTE — Progress Notes (Signed)
PT Cancellation Note  Patient Details Name: Monica [name] MRN: [id number] DOB: March 13, 1930   Cancelled Treatment:    Reason Eval/Treat Not Completed: Patient declined, no reason specified;Other (comment) Discussed benefits of treatment and importance of removing pressure from sacral wound but put refused treatment stating she was expecting a call and would try later.   Kristoffer Leamon PT, DPT, LAT, ATC  10/17/18  1:33 PM

## 2018-10-17 NOTE — Progress Notes (Signed)
[facility] Hospital Day(s): 3.   Post op day(s): 1 Day Post-Op.   Interval History: Patient seen and examined, no acute events or new complaints overnight. Patient reports she feels better and well today, denies any sacral-coccygeal pain, fever/chills, N/V, CP, or SOB. She asks about wound healing and acknowledged importance of not remaining laying on her wound, but then almost immediately thereafter refused physical therapy.  Review of Systems:  Constitutional: denies fever, chills  Respiratory: denies any shortness of breath  Cardiovascular: denies chest pain or palpitations  Gastrointestinal: denies abdominal pain, N/V, or diarrhea Musculoskeletal: denies pain, decreased motor or sensation Integumentary: denies any other rashes or skin discolorations except stage 4 sacral-coccygeal wound with VAC s/p debridement  Vital signs in last 24 hours: [min-max] current  Temp:  [97.4 F (36.3 C)-98.5 F (36.9 C)] 97.7 F (36.5 C) (04/25 0504) Pulse Rate:  [69-93] 75 (04/25 0504) Resp:  [16-26] 16 (04/25 0504) BP: (134-166)/(68-96) 152/76 (04/25 0504) SpO2:  [99 %-100 %] 99 % (04/25 0504)     Height: 5\' 7"  (170.2 cm) Weight: 84.4 kg BMI (Calculated): 29.12   Intake/Output this shift:  No intake/output data recorded.   Intake/Output last 2 shifts:  @IOLAST2SHIFTS @   Physical Exam:  Constitutional: alert, cooperative and no distress  Respiratory: breathing non-labored at rest  Cardiovascular: regular rate and sinus rhythm  Gastrointestinal: soft, non-tender, and non-distended Integumentary: sacral-coccygeal wound VAC well-secured with good seal, non-tender to palpation, no surrounding erythema  Labs:  CBC Latest Ref Rng & Units 10/15/2018 10/14/2018 10/14/2018  WBC 4.0 - 10.5 K/uL 9.3 - 11.2(H)  Hemoglobin 12.0 - 15.0 g/dL 10.2(L) 12.0 11.8(L)  Hematocrit 36.0 - 46.0 % 33.5(L) - 39.0  Platelets 150 - 400 K/uL 289 - 351   CMP Latest Ref Rng & Units 10/17/2018 10/15/2018  10/14/2018  Glucose 70 - 99 mg/dL 139(H) 119(H) 150(H)  BUN 8 - 23 mg/dL 20 40(H) 59(H)  Creatinine 0.44 - 1.00 mg/dL 0.78 1.06(H) 1.27(H)  Sodium 135 - 145 mmol/L 143 145 143  Potassium 3.5 - 5.1 mmol/L 3.3(L) 3.8 4.3  Chloride 98 - 111 mmol/L 119(H) 122(H) 118(H)  CO2 22 - 32 mmol/L 18(L) 15(L) 17(L)  Calcium 8.9 - 10.3 mg/dL 7.5(L) 8.1(L) 9.1  Total Protein 6.5 - 8.1 g/dL - - 6.5  Total Bilirubin 0.3 - 1.2 mg/dL - - 0.7  Alkaline Phos 38 - 126 U/L - - 93  AST 15 - 41 U/L - - 17  ALT 0 - 44 U/L - - 22   Imaging studies: No new pertinent imaging studies   Assessment/Plan: (ICD-10's: L89.154) 84 y.o. female stable from a surgical perspective 1 Day Post-Op s/p sharp excisional debridement and placement of negative pressure wound VAC for stage 4 sacral-coccygeal pressure necrosis wound, complicated by pertinent comorbidities including advanced chronological age, severely impaired mobility, DM, HTN, HLD, COPD, and Bell's palsy.   - pain control prn (minimize narcotics)  - pressure offloading with frequent repositioning  - will plan to change wound VAC MWF while inpatient  - medical management and discharge planning as per primary medical team  - DVT prophylaxis, ambulation/PT encouraged   All of the above findings and recommendations were discussed with the patient and Dr. Leslye Peer, and the medical team, and all of patient's questions were answered to her expressed satisfaction.  -- Marilynne Drivers Rosana Hoes, MD, [place]: [facility] - Partnering for exceptional care. Office: [phone]

## 2018-10-18 LAB — BASIC METABOLIC PANEL
Anion gap: 5 (ref 5–15)
BUN: 13 mg/dL (ref 8–23)
CO2: 19 mmol/L — ABNORMAL LOW (ref 22–32)
Calcium: 7.3 mg/dL — ABNORMAL LOW (ref 8.9–10.3)
Chloride: 119 mmol/L — ABNORMAL HIGH (ref 98–111)
Creatinine, Ser: 0.69 mg/dL (ref 0.44–1.00)
GFR calc Af Amer: >60 mL/min (ref >60)
GFR calc non Af Amer: >60 mL/min (ref >60)
Glucose, Bld: 130 mg/dL — ABNORMAL HIGH (ref 70–99)
Potassium: 2.8 mmol/L — ABNORMAL LOW (ref 3.5–5.1)
Sodium: 143 mmol/L (ref 135–145)

## 2018-10-18 LAB — GLUCOSE, CAPILLARY
Glucose-Capillary: 113 mg/dL — ABNORMAL HIGH (ref 70–99)
Glucose-Capillary: 161 mg/dL — ABNORMAL HIGH (ref 70–99)
Glucose-Capillary: 139 mg/dL — ABNORMAL HIGH (ref 70–99)
Glucose-Capillary: 157 mg/dL — ABNORMAL HIGH (ref 70–99)

## 2018-10-18 LAB — CBC
HCT: 30.1 % — ABNORMAL LOW (ref 36.0–46.0)
Hemoglobin: 9.6 g/dL — ABNORMAL LOW (ref 12.0–15.0)
MCH: 27.4 pg (ref 26.0–34.0)
MCHC: 31.9 g/dL (ref 30.0–36.0)
MCV: 86 fL (ref 80.0–100.0)
Platelets: 326 10*3/uL (ref 150–400)
RBC: 3.5 MIL/uL — ABNORMAL LOW (ref 3.87–5.11)
RDW: 16.4 % — ABNORMAL HIGH (ref 11.5–15.5)
WBC: 7.5 10*3/uL (ref 4.0–10.5)
nRBC: 0 % (ref 0.0–0.2)

## 2018-10-18 LAB — SEDIMENTATION RATE: Sed Rate: 36 mm/hr — ABNORMAL HIGH (ref 0–30)

## 2018-10-18 LAB — NOVEL CORONAVIRUS, NAA (HOSP ORDER, SEND-OUT TO REF LAB; TAT 18-24 HRS): SARS-CoV-2, NAA: NOT DETECTED

## 2018-10-18 MED ORDER — POTASSIUM CHLORIDE CRYS ER 20 MEQ PO TBCR
40.0000 meq | EXTENDED_RELEASE_TABLET | Freq: Three times a day (TID) | ORAL | Status: DC
  Administered 2018-10-18 – 2018-10-19 (×4): 40 meq via ORAL
  Filled 2018-10-18 (×3): qty 4
  Filled 2018-10-18: qty 2

## 2018-10-18 MED ORDER — RAMELTEON 8 MG PO TABS
8.0000 mg | ORAL_TABLET | Freq: Every evening | ORAL | Status: DC | PRN
  Filled 2018-10-18: qty 1

## 2018-10-18 NOTE — Progress Notes (Addendum)
Patient ID: [name], female   DOB: [birth year], 85 y.o.   MRN: [id number]  [facility] [employer] PROGRESS NOTE  Monica [name] PJK:[id number] DOB: 04/01/1930 DOA: 10/12/2018 PCP: [name], MD  HPI/Subjective: Patient feeling okay.  States she has not walked in years secondary to knee pain and low back pain.  Patient states she has a poor appetite.  Patient noticed a small pustule on her chest.  Objective: Vitals:   10/17/18 2008 10/18/18 0306  BP: (!) 145/73 (!) 158/74  Pulse: 93 77  Resp: 18 19  Temp: 98.1 F (36.7 C) 98.1 F (36.7 C)  SpO2: 100% 100%    Filed Weights   10/15/18 0500 10/16/18 0550 10/18/18 0329  Weight: 101.2 kg 84.4 kg 86.2 kg    ROS: Review of Systems  Constitutional: Negative for chills and fever.  Eyes: Negative for blurred vision.  Respiratory: Negative for cough and shortness of breath.   Cardiovascular: Negative for chest pain.  Gastrointestinal: Negative for abdominal pain, constipation, diarrhea, nausea and vomiting.  Genitourinary: Negative for dysuria.  Musculoskeletal: Positive for back pain and joint pain.  Neurological: Negative for dizziness and headaches.   Exam: Physical Exam  Constitutional: She is oriented to person, place, and time.  HENT:  Nose: No mucosal edema.  Mouth/Throat: No oropharyngeal exudate or posterior oropharyngeal edema.  Eyes: Pupils are equal, round, and reactive to light. Conjunctivae, EOM and lids are normal.  Neck: No JVD present. Carotid bruit is not present. No edema present. No thyroid mass and no thyromegaly present.  Cardiovascular: S1 normal and S2 normal. Exam reveals no gallop.  No murmur heard. Pulses:      Dorsalis pedis pulses are 2+ on the right side and 2+ on the left side.  Respiratory: No respiratory distress. She has no wheezes. She has no rhonchi. She has no rales.  GI: Soft. Bowel sounds are normal. There is no abdominal tenderness.  Musculoskeletal:     Right ankle: She exhibits no  swelling.     Left ankle: She exhibits no swelling.  Lymphadenopathy:    She has no cervical adenopathy.  Neurological: She is alert and oriented to person, place, and time. No cranial nerve deficit.  Skin: Skin is warm. Nails show no clubbing.  Wound VAC on buttock with secondary excoriations on the buttock.  Small pustule on chest that was popped.  Psychiatric: She has a normal mood and affect.      Data Reviewed: Basic Metabolic Panel: Recent Labs  Lab 10/12/18 2019 10/14/18 0424 10/15/18 0338 10/15/18 1844 10/17/18 0527 10/18/18 0435  NA 138 143 145  --  143 143  K 5.1 4.3 3.8  --  3.3* 2.8*  CL 111 118* 122*  --  119* 119*  CO2 15* 17* 15*  --  18* 19*  GLUCOSE 159* 150* 119*  --  139* 130*  BUN 94* 59* 40*  --  20 13  CREATININE 1.77* 1.27* 1.06*  --  0.78 0.69  CALCIUM 9.5 9.1 8.1*  --  7.5* 7.3*  MG  --   --   --  1.4* 1.7  --   PHOS  --   --   --  1.9* 2.0*  --    Liver Function Tests: Recent Labs  Lab 10/12/18 1715 10/14/18 0424  AST 27 17  ALT 25 22  ALKPHOS 114 93  BILITOT 0.5 0.7  PROT 7.7 6.5  ALBUMIN 2.5* 2.3*   CBC: Recent Labs  Lab 10/12/18  1715 10/14/18 0424 10/14/18 1312 10/15/18 0338 10/18/18 0435  WBC 13.8* 11.2*  --  9.3 7.5  NEUTROABS 10.6*  --   --   --   --   HGB 14.1 11.8* 12.0 10.2* 9.6*  HCT 45.4 39.0  --  33.5* 30.1*  MCV 87.6 90.5  --  88.9 86.0  PLT 483* 351  --  289 326   Cardiac Enzymes: Recent Labs  Lab 10/12/18 1715  TROPONINI <0.03    CBG: Recent Labs  Lab 10/17/18 1156 10/17/18 1658 10/17/18 2115 10/18/18 0739 10/18/18 1153  GLUCAP 158* 149* 135* 113* 161*    Recent Results (from the past 240 hour(s))  Urine Culture     Status: Abnormal   Collection Time: 10/12/18  6:10 PM  Result Value Ref Range Status   Specimen Description   Final    URINE, RANDOM Performed at [facility], [address], [place], [place] [address]    Special Requests   Final    NONE Performed at [facility], [address] [place], [place] [address]    Culture >=100,000 COLONIES/mL VIRIDANS STREPTOCOCCUS (A)  Final   Report Status 10/13/2018 FINAL  Final  Aerobic/Anaerobic Culture (surgical/deep wound)     Status: None (Preliminary result)   Collection Time: 10/16/18  8:58 AM  Result Value Ref Range Status   Specimen Description   Final    ULCER SACRAL Performed at [facility], [place] [address] [place], [place] [address]    Special Requests   Final    NONE Performed at [facility], [place], [place] [address]    Gram Stain NO WBC SEEN NO ORGANISMS SEEN   Final   Culture   Final    NO GROWTH 2 DAYS NO ANAEROBES ISOLATED; CULTURE IN PROGRESS FOR 5 DAYS Performed at [facility], [place] [address] [place], [place] [address]    Report Status PENDING  Incomplete  Novel Coronavirus, NAA (hospital order; send-out to ref lab)     Status: None   Collection Time: 10/17/18  3:46 PM  Result Value Ref Range Status   SARS-CoV-2, NAA NOT DETECTED NOT DETECTED Final    Comment: (NOTE) This test was developed and its performance characteristics determined by [employer]. This test has not been FDA cleared or approved. This test has been authorized by FDA under an Emergency Use Authorization (EUA). This test has been validated in accordance with the FDA's Guidance Document (Policy for [place] in Laboratories Certified to Perform High Complexity Testing under CLIA prior to Emergency Use Authorization for Coronavirus [id number] during the [facility] Emergency) issued on February 29th, 2020. FDA independent review of this validation is pending. This test is only authorized for the duration of time the declaration that circumstances exist justifying the authorization of the emergency use of in vitro diagnostic tests for detection of SARS-CoV- 2 virus and/or diagnosis of COVID-19 infection under section 564(b)(1) of the Act,  21 U.S.C. 379KWI-0(X)(7), unless the authorization is terminated or revoked sooner. Performed At: [facility] [place], [place] [id number] Rush Farmer MD AS:[id number]    [place]  Final    Comment: Performed at [facility], [address] [place], [place] [address]      Scheduled Meds: . collagenase   Topical Daily  . docusate sodium  100 mg Oral BID  . enoxaparin (LOVENOX) injection  40 mg Subcutaneous Q24H  . insulin  aspart  0-15 Units Subcutaneous TID WC  . insulin aspart  0-5 Units Subcutaneous QHS  . lamoTRIgine  50 mg Oral QHS  . multivitamin-lutein  1 capsule Oral Daily  . potassium chloride  40 mEq Oral TID  . Ensure Max Protein  11 oz Oral BID BM  . sertraline  50 mg Oral Daily   Continuous Infusions: . cefTRIAXone (ROCEPHIN)  IV 1 g (10/17/18 1623)  . vancomycin      Assessment/Plan:  1. Stage IV sacral decubitus down to the bone.  Case discussed with Dr. Rosana Hoes surgery that the patient had necrotic material taken out.  Since the necrotic material went down to the bone likely the patient has osteomyelitis.  MRI pelvis still pending results.  Sedimentation rate only slightly high at 36.  Empiric antibiotics with Rocephin and vancomycin for now.  Wound VAC will be continued.  Daughter interested in rehab.  I mentioned that this likely will not heal on its own and would likely need plastic surgery but I need to rule out infection first. 2. Hypokalemia replace potassium 3 times today and recheck tomorrow.  Also check a magnesium. 3. Heel decubiti.  Local wound care  4. patient has redness of the skin around the stage IV decubitus ulcer. 5. Type 2 diabetes mellitus on sliding scale insulin.  Last A1c 6.5.  Diet controlled. 6. Depression on Zoloft and Lamictal 7. COVID-19 test negative.  Test was ordered because the patient will likely go out to rehab.  Not because the patient has symptoms.  Code Status:      Code Status Orders  (From admission, onward)         Start     Ordered   10/13/18 0458  Full code  Continuous     10/13/18 0457        Code Status History    This patient has a current code status but no historical code status.     Family Communication: Spoke with daughter on the phone Disposition Plan: To be determined  Consultants:  Psychiatry  General surgery  Antibiotics: -Rocephin and vancomycin Time spent: 27 minutes  [place]

## 2018-10-18 NOTE — Consult Note (Signed)
[facility] Face-to-Face Psychiatry Consult Progress Note  Reason for Consult:  Confusion and Depression Referring Physician:  Dr. Tressia Miners Patient Identification: Monica [name] MRN:  [id number] Principal Diagnosis: Weakness Diagnosis:  Principal Problem:   Weakness Active Problems:   Major depressive disorder, recurrent episode with anxious distress ([place])   Pressure injury of skin   Total Time spent with patient: 27 minutes  HPI:    Patient reports not sleeping well last night.  Not had melatonin or ramelteon in the past and she is agreeable to start this medication on a as needed basis.  Emotionally, says that she is feeling neutral today with no irritability or depression.  No suicidal or homicidal thoughts.  Patient has been calm and cooperative on the unit.  Has not required any as needed Seroquel.  Does not feel subjectively confused.  Patient is alert and conversive.  No important sharing points.  Denies auditory and visual hallucinations in addition to delusions.  Appetite is poor to fair. Past Psychiatric History:   Depression, anxiety, delirium   Risk to Self:   Risk to Others:   Prior Inpatient Therapy:   Prior Outpatient Therapy:    Past Medical History:  Past Medical History:  Diagnosis Date  . B12 deficiency   . COPD (chronic obstructive pulmonary disease) ([place])   . Diabetes mellitus   . H/O: Bell's palsy   . Hyperlipidemia   . Hypertension   . Lung mass April 2011   s/p biopsy, no cancer cells found, Repeat CT Dec 2012 unchanged    Past Surgical History:  Procedure Laterality Date  . ABDOMINAL HYSTERECTOMY    . APPLICATION OF WOUND VAC N/A 10/16/2018   Procedure: APPLICATION OF WOUND VAC;  Surgeon: [name], MD;  Location: [facility] ORS;  Service: General;  Laterality: N/A;  . CHOLECYSTECTOMY    . INCISION AND DRAINAGE OF WOUND N/A 10/16/2018   Procedure: IRRIGATION AND DEBRIDEMENT WOUND;  Surgeon: [name], MD;  Location: [facility] ORS;  Service:  General;  Laterality: N/A;  . LUNG BIOPSY  2012   for positive PET, negative biopsy for malignancy  . SPINE SURGERY  2001   Lumbar   Family History:  Family History  Problem Relation Age of Onset  . Cancer Mother 67       Breast  . Diabetes Mother   . Heart disease Father 83       Acute MI  . Heart disease Sister   . Arthritis Sister   . Heart disease Brother    Family Psychiatric  History:  Social History:  Patient lives with her husband, daughter and granddaughter.   Patient states that she is 85 years old and living with her mommy and daddy  Denies alcohol or drug use.  Does not have access to weapons.  Social History   Substance and Sexual Activity  Alcohol Use No     Social History   Substance and Sexual Activity  Drug Use No    Social History   Socioeconomic History  . Marital status: Widowed    Spouse name: Not on file  . Number of children: Not on file  . Years of education: Not on file  . Highest education level: Not on file  Occupational History  . Not on file  Social Needs  . Financial resource strain: Not very hard  . Food insecurity:    Worry: Patient refused    Inability: Patient refused  . Transportation needs:    Medical: Patient  refused    Non-medical: Patient refused  Tobacco Use  . Smoking status: Former Smoker    Packs/day: 0.50    Years: 5.00    Pack years: 2.50    Types: Cigarettes  . Smokeless tobacco: Never Used  . Tobacco comment: quit approx 2016  Substance and Sexual Activity  . Alcohol use: No  . Drug use: No  . Sexual activity: Not Currently  Lifestyle  . Physical activity:    Days per week: Patient refused    Minutes per session: Patient refused  . Stress: Only a little  Relationships  . Social connections:    Talks on phone: Patient refused    Gets together: Patient refused    Attends religious service: Patient refused    Active member of club or organization: Patient refused    Attends meetings of clubs  or organizations: Patient refused    Relationship status: Patient refused  Other Topics Concern  . Not on file  Social History Narrative  . Not on file   Additional Social History:    Allergies:  No Known Allergies  Labs:  Results for orders placed or performed during the hospital encounter of 10/12/18 (from the past 48 hour(s))  Glucose, capillary     Status: Abnormal   Collection Time: 10/16/18 11:07 AM  Result Value Ref Range   Glucose-Capillary 121 (H) 70 - 99 mg/dL  Glucose, capillary     Status: Abnormal   Collection Time: 10/16/18 11:52 AM  Result Value Ref Range   Glucose-Capillary 136 (H) 70 - 99 mg/dL  Glucose, capillary     Status: Abnormal   Collection Time: 10/16/18  4:54 PM  Result Value Ref Range   Glucose-Capillary 196 (H) 70 - 99 mg/dL  Glucose, capillary     Status: Abnormal   Collection Time: 10/16/18  9:09 PM  Result Value Ref Range   Glucose-Capillary 136 (H) 70 - 99 mg/dL  Basic metabolic panel     Status: Abnormal   Collection Time: 10/17/18  5:27 AM  Result Value Ref Range   Sodium 143 135 - 145 mmol/L   Potassium 3.3 (L) 3.5 - 5.1 mmol/L   Chloride 119 (H) 98 - 111 mmol/L   CO2 18 (L) 22 - 32 mmol/L   Glucose, Bld 139 (H) 70 - 99 mg/dL   BUN 20 8 - 23 mg/dL   Creatinine, Ser 0.78 0.44 - 1.00 mg/dL   Calcium 7.5 (L) 8.9 - 10.3 mg/dL   GFR calc non Af Amer >60 >60 mL/min   GFR calc Af Amer >60 >60 mL/min   Anion gap 6 5 - 15    Comment: Performed at [facility], [address] [place], [place] [address]  Phosphorus     Status: Abnormal   Collection Time: 10/17/18  5:27 AM  Result Value Ref Range   Phosphorus 2.0 (L) 2.5 - 4.6 mg/dL    Comment: Performed at [facility], [address] [place], [place] [address]  Magnesium     Status: None   Collection Time: 10/17/18  5:27 AM  Result Value Ref Range   Magnesium 1.7 1.7 - 2.4 mg/dL    Comment: Performed at [facility] [address] [place], [place]  [address]  Glucose, capillary     Status: Abnormal   Collection Time: 10/17/18  7:38 AM  Result Value Ref Range   Glucose-Capillary 115 (H) 70 - 99 mg/dL  Glucose, capillary     Status: Abnormal  Collection Time: 10/17/18 11:56 AM  Result Value Ref Range   Glucose-Capillary 158 (H) 70 - 99 mg/dL  Glucose, capillary     Status: Abnormal   Collection Time: 10/17/18  4:58 PM  Result Value Ref Range   Glucose-Capillary 149 (H) 70 - 99 mg/dL   Comment 1 Notify RN   Glucose, capillary     Status: Abnormal   Collection Time: 10/17/18  9:15 PM  Result Value Ref Range   Glucose-Capillary 135 (H) 70 - 99 mg/dL  Sedimentation rate     Status: Abnormal   Collection Time: 10/18/18  4:35 AM  Result Value Ref Range   Sed Rate 36 (H) 0 - 30 mm/hr    Comment: Performed at [facility], [address] [place], [place] [address]  CBC     Status: Abnormal   Collection Time: 10/18/18  4:35 AM  Result Value Ref Range   WBC 7.5 4.0 - 10.5 K/uL   RBC 3.50 (L) 3.87 - 5.11 MIL/uL   Hemoglobin 9.6 (L) 12.0 - 15.0 g/dL   HCT 30.1 (L) 36.0 - 46.0 %   MCV 86.0 80.0 - 100.0 fL   MCH 27.4 26.0 - 34.0 pg   MCHC 31.9 30.0 - 36.0 g/dL   RDW 16.4 (H) 11.5 - 15.5 %   Platelets 326 150 - 400 K/uL   nRBC 0.0 0.0 - 0.2 %    Comment: Performed at [facility], [address] [place], [place] [address]  Basic metabolic panel     Status: Abnormal   Collection Time: 10/18/18  4:35 AM  Result Value Ref Range   Sodium 143 135 - 145 mmol/L   Potassium 2.8 (L) 3.5 - 5.1 mmol/L   Chloride 119 (H) 98 - 111 mmol/L   CO2 19 (L) 22 - 32 mmol/L   Glucose, Bld 130 (H) 70 - 99 mg/dL   BUN 13 8 - 23 mg/dL   Creatinine, Ser 0.69 0.44 - 1.00 mg/dL   Calcium 7.3 (L) 8.9 - 10.3 mg/dL   GFR calc non Af Amer >60 >60 mL/min   GFR calc Af Amer >60 >60 mL/min   Anion gap 5 5 - 15    Comment: Performed at [facility], [address] [place], [place] [address]  Glucose, capillary     Status: Abnormal    Collection Time: 10/18/18  7:39 AM  Result Value Ref Range   Glucose-Capillary 113 (H) 70 - 99 mg/dL    Current Facility-Administered Medications  Medication Dose Route Frequency Provider Last Rate Last Dose  . acetaminophen (TYLENOL) tablet 650 mg  650 mg Oral Q6H PRN Harrie Foreman, MD   650 mg at 10/17/18 0023   Or  . acetaminophen (TYLENOL) suppository 650 mg  650 mg Rectal Q6H PRN Harrie Foreman, MD      . cefTRIAXone (ROCEPHIN) 1 g in sodium chloride 0.9 % 100 mL IVPB  1 g Intravenous Q24H Loletha Grayer, MD 200 mL/hr at 10/17/18 1623 1 g at 10/17/18 1623  . collagenase (SANTYL) ointment   Topical Daily Sainani, Belia Heman, MD      . docusate sodium (COLACE) capsule 100 mg  100 mg Oral BID Cyndee Brightly M, RPH      . enoxaparin (LOVENOX) injection 40 mg  40 mg Subcutaneous Q24H Gladstone Lighter, MD   40 mg at 10/17/18 1725  . insulin aspart (novoLOG) injection 0-15 Units  0-15 Units Subcutaneous TID WC Harrie Foreman, MD  2 Units at 10/17/18 1725  . insulin aspart (novoLOG) injection 0-5 Units  0-5 Units Subcutaneous QHS Harrie Foreman, MD      . lamoTRIgine (LAMICTAL) tablet 50 mg  50 mg Oral QHS Henreitta Leber, MD   50 mg at 10/17/18 2154  . multivitamin-lutein (OCUVITE-LUTEIN) capsule 1 capsule  1 capsule Oral Daily Henreitta Leber, MD   1 capsule at 10/17/18 0900  . ondansetron (ZOFRAN) tablet 4 mg  4 mg Oral Q6H PRN Harrie Foreman, MD       Or  . ondansetron [facility]) injection 4 mg  4 mg Intravenous Q6H PRN Harrie Foreman, MD      . oxyCODONE (Oxy IR/ROXICODONE) immediate release tablet 5 mg  5 mg Oral Q6H PRN Wieting, Richard, MD      . potassium chloride SA (K-DUR) CR tablet 40 mEq  40 mEq Oral TID Loletha Grayer, MD      . protein supplement (ENSURE MAX) liquid  11 oz Oral BID BM Henreitta Leber, MD   11 oz at 10/17/18 1400  . QUEtiapine (SEROQUEL) tablet 25 mg  25 mg Oral BID PRN Lavella Hammock, MD      . ramelteon (ROZEREM) tablet 8 mg   8 mg Oral QHS PRN Rulon Sera, MD      . sertraline (ZOLOFT) tablet 50 mg  50 mg Oral Daily Cyndee Brightly [place], [place]      . vancomycin (VANCOCIN) 1,250 mg in sodium chloride 0.9 % 250 mL IVPB  1,250 mg Intravenous Q24H [place], Pierce Crane, [facility]        Psychiatric Specialty Exam: Physical Exam  Nursing note and vitals reviewed. Constitutional: She appears well-developed and well-nourished. No distress.  HENT:  Head: Normocephalic and atraumatic.  Eyes: EOM are normal.  Neck: Normal range of motion.  Cardiovascular: Normal rate and regular rhythm.  Respiratory: Effort normal. No respiratory distress.  Musculoskeletal: Normal range of motion.  Neurological: She is alert.    Review of Systems  Psychiatric/Behavioral: Positive for depression. Negative for hallucinations, memory loss, substance abuse and suicidal ideas. The patient is not nervous/anxious and does not have insomnia.   All other systems reviewed and are negative. Patient having fluctuating levels of consciousness  Blood pressure 140/73, pulse 76, temperature (!) 97.4 F (36.3 C), temperature source Oral, resp. rate 17, height 5\' 7"  (1.702 m), weight 101.2 kg, SpO2 99 %.Body mass index is 34.93 kg/m.  General Appearance: Well Groomed  Eye Contact:  Good, improved  Speech:  Clear and Coherent  Volume:  Normal  Mood: Fair  Affect:  neutral  Thought Process:  Coherent  Orientation:  Full (Time, Place, and Person)  Thought Content:  Illogical  Suicidal Thoughts:  No  Homicidal Thoughts:  No  Memory:  Fair  Judgement:  Fair  Insight:  Present  Psychomotor Activity:  Decreased  Concentration:  Attention Span: Fair  Recall:  [employer] of Knowledge:  Fair  Language:  Good  Akathisia:  No  Handed:  Right  AIMS (if indicated):     Assets:  Desire for Improvement Financial Resources/Insurance Housing Social Support  ADL's:  Impaired  Cognition:  Impaired,  Mild  Sleep:   Adequate in hospital         Treatment Plan Summary: Medication management  No medication changes I do agree with Dr. Annye Rusk thoughts about her Lamictal.  Patient has chronic kidney disease and may want to eventually discontinue the ictal on an outpatient  basis.  Patient does not meet inpatient criteria Spoke with medical provider Discussed case with staff and nursing  4/26 No changes   Disposition: Patient does not meet criteria for psychiatric inpatient admission.  Rulon Sera, MD 10/18/2018 9:19 AM

## 2018-10-18 NOTE — Progress Notes (Signed)
Physical Therapy Treatment Patient Details Name: Monica [name] MRN: [id number] DOB: 03-13-1930 Today's Date: 10/18/2018    History of Present Illness 85 y.o. female with a past medical history of COPD, diabetes, hypertension, hyperlipidemia, CKD, presents to the emergency department for generalized weakness.  Apparently she has been unable to stand/walk for ~2 weeks.    PT Comments    Patient in bed and awake on PT arrival; patient amenable to therapy. Patient able to perform BLE strengthening and AAROM exercises with good effort, although she reports fatigue quickly. Patient unwilling to attempt sitting EOB today due to feeling "shaky." Patient expresses appropriate safety concerns and demonstrates understanding of the importance of movement and being off her wound site, but expresses fear of falling. Patient will continue to benefit from skilled therapeutic intervention to address deficits in strength, mobility, balance, and safety.  Patient in bed with call bell in reach, bed alarm set, and all needs met.    Follow Up Recommendations  SNF     Equipment Recommendations  None recommended by PT    Recommendations for Other Services       Precautions / Restrictions      Mobility  Bed Mobility                  Transfers                    Ambulation/Gait                 Stairs             Wheelchair Mobility    Modified Rankin (Stroke Patients Only)       Balance                                            Cognition Arousal/Alertness: Awake/alert Behavior During Therapy: WFL for tasks assessed/performed Overall Cognitive Status: Within Functional Limits for tasks assessed                                        Exercises General Exercises - Lower Extremity Ankle Circles/Pumps: AAROM;10 reps Quad Sets: AROM;10 reps Heel Slides: Strengthening;10 reps Hip ABduction/ADduction: Strengthening;10  reps Straight Leg Raises: AAROM;10 reps    General Comments        Pertinent Vitals/Pain Pain Assessment: No/denies pain    Home Living                      Prior Function            PT Goals (current goals can now be found in the care plan section) Acute Rehab PT Goals Patient Stated Goal: unable to state goals PT Goal Formulation: With patient Time For Goal Achievement: 10/27/18 Potential to Achieve Goals: Good    Frequency    Min 2X/week      PT Plan Current plan remains appropriate    Co-evaluation              AM-PAC PT "6 Clicks" Mobility   Outcome Measure  Help needed turning from your back to your side while in a flat bed without using bedrails?: A Lot Help needed moving from lying on your back to sitting on the side of a flat bed without using  bedrails?: A Lot Help needed moving to and from a bed to a chair (including a wheelchair)?: Total Help needed standing up from a chair using your arms (e.g., wheelchair or bedside chair)?: Total Help needed to walk in hospital room?: Total Help needed climbing 3-5 steps with a railing? : Total 6 Click Score: 8    End of Session   Activity Tolerance: Patient limited by fatigue Patient left: with bed alarm set;with call bell/phone within reach;with SCD's reapplied;in bed   PT Visit Diagnosis: Muscle weakness (generalized) (M62.81);Difficulty in walking, not elsewhere classified (R26.2)     Time: 3005-1102 PT Time Calculation (min) (ACUTE ONLY): 19 min  Charges:  $Therapeutic Exercise: 8-22 mins                     Myles Gip PT, DPT [phone] 10/18/2018, 3:12 PM

## 2018-10-18 NOTE — Plan of Care (Signed)
  Problem: Education: Goal: Knowledge of General Education information will improve Description Including pain rating scale, medication(s)/side effects and non-pharmacologic comfort measures Outcome: Progressing   Problem: Health Behavior/Discharge Planning: Goal: Ability to manage health-related needs will improve Outcome: Progressing   Problem: Urinary Elimination: Goal: Signs and symptoms of infection will decrease Outcome: Progressing

## 2018-10-19 ENCOUNTER — Inpatient Hospital Stay: Payer: Self-pay

## 2018-10-19 LAB — GLUCOSE, CAPILLARY
Glucose-Capillary: 129 mg/dL — ABNORMAL HIGH (ref 70–99)
Glucose-Capillary: 140 mg/dL — ABNORMAL HIGH (ref 70–99)
Glucose-Capillary: 182 mg/dL — ABNORMAL HIGH (ref 70–99)
Glucose-Capillary: 187 mg/dL — ABNORMAL HIGH (ref 70–99)

## 2018-10-19 LAB — BASIC METABOLIC PANEL
Anion gap: 5 (ref 5–15)
BUN: 16 mg/dL (ref 8–23)
CO2: 22 mmol/L (ref 22–32)
Calcium: 7.9 mg/dL — ABNORMAL LOW (ref 8.9–10.3)
Chloride: 114 mmol/L — ABNORMAL HIGH (ref 98–111)
Creatinine, Ser: 0.79 mg/dL (ref 0.44–1.00)
GFR calc Af Amer: >60 mL/min (ref >60)
GFR calc non Af Amer: >60 mL/min (ref >60)
Glucose, Bld: 150 mg/dL — ABNORMAL HIGH (ref 70–99)
Potassium: 4.5 mmol/L (ref 3.5–5.1)
Sodium: 141 mmol/L (ref 135–145)

## 2018-10-19 LAB — MAGNESIUM: Magnesium: 1.6 mg/dL — ABNORMAL LOW (ref 1.7–2.4)

## 2018-10-19 MED ORDER — SODIUM CHLORIDE 0.9% FLUSH: 10.0000 mL | INTRAVENOUS | Status: DC | PRN

## 2018-10-19 MED ORDER — MAGNESIUM SULFATE 2 GM/50ML IV SOLN
2.0000 g | Freq: Once | INTRAVENOUS | Status: AC
  Administered 2018-10-19: 16:00:00 2 g via INTRAVENOUS
  Filled 2018-10-19: qty 50

## 2018-10-19 MED ORDER — SODIUM CHLORIDE 0.9% FLUSH
10.0000 mL | Freq: Two times a day (BID) | INTRAVENOUS | Status: DC
  Administered 2018-10-20: 10 mL

## 2018-10-19 MED ORDER — SODIUM CHLORIDE 0.9 % IV SOLN
INTRAVENOUS | Status: DC | PRN
  Administered 2018-10-19: 500 mL via INTRAVENOUS

## 2018-10-19 MED ORDER — METOPROLOL SUCCINATE ER 25 MG PO TB24
25.0000 mg | ORAL_TABLET | Freq: Every day | ORAL | Status: DC
  Administered 2018-10-19 – 2018-10-20 (×2): 25 mg via ORAL
  Filled 2018-10-19 (×2): qty 1

## 2018-10-19 NOTE — Progress Notes (Signed)
Patient ID: Monica [name], female   DOB: Sep 16, 1929, 85 y.o.   MRN: [id number]  [facility] PROGRESS NOTE  Monica [name] [id number]:[id number] DOB: 02/25/1930 DOA: 10/12/2018 PCP: [name], MD  HPI/Subjective: Patient feeling okay.  Little spot on her chest is not bothersome to her.  Objective: Vitals:   10/19/18 0454 10/19/18 0902  BP: (!) 157/83 (!) 144/84  Pulse: 95 100  Resp: 17 18  Temp: 98.1 F (36.7 C) 98 F (36.7 C)  SpO2: 99% 99%    Filed Weights   10/16/18 0550 10/18/18 0329 10/19/18 0454  Weight: 84.4 kg 86.2 kg 86.7 kg    ROS: Review of Systems  Constitutional: Negative for chills and fever.  Eyes: Negative for blurred vision.  Respiratory: Negative for cough and shortness of breath.   Cardiovascular: Negative for chest pain.  Gastrointestinal: Negative for abdominal pain, constipation, diarrhea, nausea and vomiting.  Genitourinary: Negative for dysuria.  Musculoskeletal: Positive for back pain and joint pain.  Neurological: Negative for dizziness and headaches.   Exam: Physical Exam  Constitutional: She is oriented to person, place, and time.  HENT:  Nose: No mucosal edema.  Mouth/Throat: No oropharyngeal exudate or posterior oropharyngeal edema.  Eyes: Pupils are equal, round, and reactive to light. Conjunctivae, EOM and lids are normal.  Neck: No JVD present. Carotid bruit is not present. No edema present. No thyroid mass and no thyromegaly present.  Cardiovascular: S1 normal and S2 normal. Exam reveals no gallop.  No murmur heard. Pulses:      Dorsalis pedis pulses are 2+ on the right side and 2+ on the left side.  Respiratory: No respiratory distress. She has no wheezes. She has no rhonchi. She has no rales.  GI: Soft. Bowel sounds are normal. There is no abdominal tenderness.  Musculoskeletal:     Right ankle: She exhibits no swelling.     Left ankle: She exhibits no swelling.  Lymphadenopathy:    She has no cervical adenopathy.  Neurological:  She is alert and oriented to person, place, and time. No cranial nerve deficit.  Skin: Skin is warm. Nails show no clubbing.  Wound VAC on buttock with secondary excoriations on the buttock.  Small pustule on chest that was popped.  Psychiatric: She has a normal mood and affect.      Data Reviewed: Basic Metabolic Panel: Recent Labs  Lab 10/14/18 0424 10/15/18 0338 10/15/18 1844 10/17/18 0527 10/18/18 0435 10/19/18 0420  NA 143 145  --  143 143 141  K 4.3 3.8  --  3.3* 2.8* 4.5  CL 118* 122*  --  119* 119* 114*  CO2 17* 15*  --  18* 19* 22  GLUCOSE 150* 119*  --  139* 130* 150*  BUN 59* 40*  --  20 13 16   CREATININE 1.27* 1.06*  --  0.78 0.69 0.79  CALCIUM 9.1 8.1*  --  7.5* 7.3* 7.9*  MG  --   --  1.4* 1.7  --  1.6*  PHOS  --   --  1.9* 2.0*  --   --    Liver Function Tests: Recent Labs  Lab 10/12/18 1715 10/14/18 0424  AST 27 17  ALT 25 22  ALKPHOS 114 93  BILITOT 0.5 0.7  PROT 7.7 6.5  ALBUMIN 2.5* 2.3*   CBC: Recent Labs  Lab 10/12/18 1715 10/14/18 0424 10/14/18 1312 10/15/18 0338 10/18/18 0435  WBC 13.8* 11.2*  --  9.3 7.5  NEUTROABS 10.6*  --   --   --   --  HGB 14.1 11.8* 12.0 10.2* 9.6*  HCT 45.4 39.0  --  33.5* 30.1*  MCV 87.6 90.5  --  88.9 86.0  PLT 483* 351  --  289 326   Cardiac Enzymes: Recent Labs  Lab 10/12/18 1715  TROPONINI <0.03    CBG: Recent Labs  Lab 10/18/18 1153 10/18/18 1729 10/18/18 2116 10/19/18 0743 10/19/18 1153  GLUCAP 161* 139* 157* 129* 140*    Recent Results (from the past 240 hour(s))  Urine Culture     Status: Abnormal   Collection Time: 10/12/18  6:10 PM  Result Value Ref Range Status   Specimen Description   Final    URINE, RANDOM Performed at [facility], [address], [place], [place] [address]    Special Requests   Final    NONE Performed at [facility], [address] [place], [place] [address]    Culture >=100,000 COLONIES/mL VIRIDANS STREPTOCOCCUS (A)  Final    Report Status 10/13/2018 FINAL  Final  Aerobic/Anaerobic Culture (surgical/deep wound)     Status: None (Preliminary result)   Collection Time: 10/16/18  8:58 AM  Result Value Ref Range Status   Specimen Description   Final    ULCER SACRAL Performed at [facility], [place] [address] [place], [place] [address]    Special Requests   Final    NONE Performed at [facility], [place], [place] [address]    Gram Stain NO WBC SEEN NO ORGANISMS SEEN   Final   Culture   Final    NO GROWTH 3 DAYS NO ANAEROBES ISOLATED; CULTURE IN PROGRESS FOR 5 DAYS Performed at [facility], [address], [place], [place] [address]    Report Status PENDING  Incomplete  Novel Coronavirus, NAA (hospital order; send-out to ref lab)     Status: None   Collection Time: 10/17/18  3:46 PM  Result Value Ref Range Status   SARS-CoV-2, NAA NOT DETECTED NOT DETECTED Final    Comment: (NOTE) This test was developed and its performance characteristics determined by [employer]. This test has not been FDA cleared or approved. This test has been authorized by FDA under an Emergency Use Authorization (EUA). This test has been validated in accordance with the FDA's Guidance Document (Policy for [place] in Laboratories Certified to Perform High Complexity Testing under CLIA prior to Emergency Use Authorization for Coronavirus [id number] during the [facility] Emergency) issued on February 29th, 2020. FDA independent review of this validation is pending. This test is only authorized for the duration of time the declaration that circumstances exist justifying the authorization of the emergency use of in vitro diagnostic tests for detection of SARS-CoV- 2 virus and/or diagnosis of COVID-19 infection under section 564(b)(1) of the Act, 21 U.S.C. 366YQI-3(K)(7), unless the authorization is terminated or revoked sooner. Performed At: [facility] [place], [place] [id number] Rush Farmer MD FI:[id number]    Divide  Final    Comment: Performed at [facility], [address] [place], [place] [address]      Scheduled Meds: . collagenase   Topical Daily  . docusate sodium  100 mg Oral BID  . enoxaparin (LOVENOX) injection  40 mg Subcutaneous Q24H  . insulin aspart  0-15 Units Subcutaneous TID WC  . insulin aspart  0-5 Units Subcutaneous QHS  . lamoTRIgine  50 mg Oral QHS  . multivitamin-lutein  1 capsule Oral Daily  .  potassium chloride  40 mEq Oral TID  . Ensure Max Protein  11 oz Oral BID BM  . sertraline  50 mg Oral Daily   Continuous Infusions: . sodium chloride    . cefTRIAXone (ROCEPHIN)  IV Stopped (10/18/18 1639)  . vancomycin 1,250 mg (10/18/18 1748)    Assessment/Plan:  1. 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  Case discussed with infectious disease and recommendations of 2 weeks of IV antibiotics (Rocephin and vancomycin) followed by 2 to 4 weeks of oral antibiotics.  PICC line ordered.  Case discussed with general surgery team and wound VAC will be needed.  Patient will have to follow-up with surgery and infectious disease as outpatient. 2. Hypomagnesemia.  Replace magnesium today 3. Hypokalemia replaced potassium yesterday.  Potassium in normal range today 4. Heel decubiti.  Local wound care  5. patient has redness of the skin around the stage IV decubitus ulcer. 6. Type 2 diabetes mellitus on sliding scale insulin.  Last A1c 6.5.  Diet controlled. 7. Depression on Zoloft and Lamictal 8. COVID-19 test negative.  Test was ordered because the patient will likely go out to rehab.  Not because the patient has symptoms.  Code Status:     Code Status Orders  (From admission, onward)         Start     Ordered   10/13/18 0458  Full code  Continuous     10/13/18 0457        Code Status History    This patient has a current code  status but no historical code status.     Family Communication: Spoke with daughter on the phone about plan for IV antibiotics Disposition Plan: Social worker to look into bed options for wound VAC and IV antibiotics.  Consultants:  Psychiatry  General surgery  Infectious disease  Antibiotics: -Rocephin and vancomycin  Time spent: 30 minutes in coordination of care speaking with general surgery, infectious disease, social work and family  [place]

## 2018-10-19 NOTE — Progress Notes (Signed)
Patient ID: Monica [name], female   DOB: 01-22-1930, 85 y.o.   MRN: [id number]  ACP note  Spoke with patient at the bedside and daughter on the phone (secondary to no visitor policy)  Patient is a full code.  Patient came in with weakness and was found to have a sacral decubiti.  Patient was seen by the general surgery team and they debrided the decubiti and it went down to the bone.  An MRI of the sacrum showed early osteomyelitis.  Overall prognosis is poor when we do not get up and walk around.  The patient is bedbound for the last couple years.  When there is a stage IV decubitus ulcer down to bone and infection in the bone prognosis is worse.  The patient will need IV antibiotics for 2 weeks and then oral antibiotics for another 2 to 4 weeks.  They will have to follow-up how the wound is healing with the wound VAC.  The likelihood of these healing on their own without any plastic surgery is minimal.  There is a high likelihood for complications and further infections.  This was explained to the patient's daughter.  Time spent on ACP discussion 17 minutes Dr Loletha Grayer

## 2018-10-19 NOTE — TOC Progression Note (Signed)
Transition of Care [facility]) - Progression Note    Patient Details  Name: Monica [name] MRN: [id number] Date of Birth: 13-Sep-1929  Transition of [facility] CM/SW Contact  Shade Flood, LCSW Phone Number: 10/19/2018, 3:38 PM  Clinical Narrative:     TOC following. Discussed pt's status with MD and reviewed chart notes. Plan remains for transition to [place] at dc. Updated Claiborne Billings at [facility] that pt will need wound vac and IV Vanc at dc. Claiborne Billings states she will order the vac and they are ready for pt as soon as tomorrow.  Will follow.  Expected Discharge Plan: [place] Barriers to Discharge: Continued Medical Work up  Expected Discharge Plan and Services Expected Discharge Plan: [place]   Discharge Planning Services: CM Consult   Living arrangements for the past 2 months: Single Family Home Expected Discharge Date: 10/15/18                                     Social Determinants of Health (SDOH) Interventions    Readmission Risk Interventions No flowsheet data found.

## 2018-10-19 NOTE — Progress Notes (Signed)
Infectious Disease Long Term IV Antibiotic Orders TENISHA FLEECE 1929/11/19  Diagnosis:  Sacral decub with infection and coccyx osteomyelitis Bedbound, mild to mod dementia.   Culture results negative  LABS Lab Results  Component Value Date   CREATININE 0.79 10/19/2018   Lab Results  Component Value Date   WBC 7.5 10/18/2018   HGB 9.6 (L) 10/18/2018   HCT 30.1 (L) 10/18/2018   MCV 86.0 10/18/2018   PLT 326 10/18/2018   Lab Results  Component Value Date   ESRSEDRATE 36 (H) 10/18/2018   Lab Results  Component Value Date   CRP 15.0 08/14/2012    Allergies: No Known Allergies  Discharge antibiotics Vancomycin          1250        mg  every       24        hours .     Goal vancomycin trough 15-20.    Pharmacy to adjust dosing based on levels Ceftriaxone 1 grams every       24        hours  PICC Care per protocol Labs weekly while on IV antibiotics -FAX weekly labs to [phone] CBC w diff   Comprehensive met panel Vancomycin Trough   CRP, ESR   Planned duration of antibiotics 2 weeks   Stop date 11/02/18 Follow up clinic date TBD by Dr Delaine Lame   Leonel Ramsay, MD

## 2018-10-19 NOTE — Care Management Important Message (Signed)
Important Message  Patient Details  Name: Monica [name] MRN: [id number] Date of Birth: 09-Jan-1930   Medicare Important Message Given:  Yes    Juliann Pulse A Kendi Defalco 10/19/2018, 11:42 AM

## 2018-10-19 NOTE — Consult Note (Signed)
Infectious Disease     Reason for Consult: Sacral Osteomyelitis   Referring Physician: Karlton Lemon Date of Admission:  10/12/2018   Principal Problem:   Weakness Active Problems:   Major depressive disorder, recurrent episode with anxious distress ([place])   Pressure injury of skin   HPI: Monica [name] is a 85 y.o. female with hx of DM, HTN, spinal stenosis, COPD, FTT with increasing immobility and the development of a sacral decubitus ulcer. She had recent UTI and was treated with abx.   She is largely bedbound for the last several weeks and having increasing delirium.  On admit was seen by surgery and palliative care.  Underwent debridement down to exposed bone with removal of necrotic tissue. MRI showed early sacral osteomyelitis. ESR 35. Cxs from wound negative.   When I spoke with her today she was able to tell me she was in the hospital due to a sore on her backside.  She could not tell me how long it was there.  She said her "mama" told her about the wound.  She is able to tell me her name and that she is in the hospital but is not oriented to the date.  He denies fevers chills or pain at the site.  The wound was just dressed.  Past Medical History:  Diagnosis Date  . B12 deficiency   . COPD (chronic obstructive pulmonary disease) ([place])   . Diabetes mellitus   . H/O: Bell's palsy   . Hyperlipidemia   . Hypertension   . Lung mass April 2011   s/p biopsy, no cancer cells found, Repeat CT Dec 2012 unchanged   Past Surgical History:  Procedure Laterality Date  . ABDOMINAL HYSTERECTOMY    . APPLICATION OF WOUND VAC N/A 10/16/2018   Procedure: APPLICATION OF WOUND VAC;  Surgeon: [name], MD;  Location: [facility] ORS;  Service: General;  Laterality: N/A;  . CHOLECYSTECTOMY    . INCISION AND DRAINAGE OF WOUND N/A 10/16/2018   Procedure: IRRIGATION AND DEBRIDEMENT WOUND;  Surgeon: [name], MD;  Location: [facility] ORS;  Service: General;  Laterality: N/A;  . LUNG BIOPSY  2012    for positive PET, negative biopsy for malignancy  . SPINE SURGERY  2001   Lumbar   Social History   Tobacco Use  . Smoking status: Former Smoker    Packs/day: 0.50    Years: 5.00    Pack years: 2.50    Types: Cigarettes  . Smokeless tobacco: Never Used  . Tobacco comment: quit approx 2016  Substance Use Topics  . Alcohol use: No  . Drug use: No   Family History  Problem Relation Age of Onset  . Cancer Mother 60       Breast  . Diabetes Mother   . Heart disease Father 26       Acute MI  . Heart disease Sister   . Arthritis Sister   . Heart disease Brother     Allergies: No Known Allergies  Current antibiotics: Antibiotics Given (last 72 hours)    Date/Time Action Medication Dose Rate   10/17/18 1623 New Bag/Given   cefTRIAXone (ROCEPHIN) 1 g in sodium chloride 0.9 % 100 mL IVPB 1 g 200 mL/hr   10/17/18 1900 New Bag/Given   vancomycin (VANCOCIN) 1,750 mg in sodium chloride 0.9 % 500 mL IVPB 1,750 mg 250 mL/hr   10/18/18 1604 New Bag/Given   cefTRIAXone (ROCEPHIN) 1 g in sodium chloride 0.9 % 100 mL IVPB  1 g 200 mL/hr   10/18/18 1748 New Bag/Given   vancomycin (VANCOCIN) 1,250 mg in sodium chloride 0.9 % 250 mL IVPB 1,250 mg 166.7 mL/hr      MEDICATIONS: . collagenase   Topical Daily  . docusate sodium  100 mg Oral BID  . enoxaparin (LOVENOX) injection  40 mg Subcutaneous Q24H  . insulin aspart  0-15 Units Subcutaneous TID WC  . insulin aspart  0-5 Units Subcutaneous QHS  . lamoTRIgine  50 mg Oral QHS  . multivitamin-lutein  1 capsule Oral Daily  . potassium chloride  40 mEq Oral TID  . Ensure Max Protein  11 oz Oral BID BM  . sertraline  50 mg Oral Daily    Review of Systems -unable to obtain due to dementia OBJECTIVE: Temp:  [97.8 F (36.6 C)-98.7 F (37.1 C)] 98 F (36.7 C) (04/27 0902) Pulse Rate:  [86-108] 100 (04/27 0902) Resp:  [16-18] 18 (04/27 0902) BP: (137-158)/(73-84) 144/84 (04/27 0902) SpO2:  [97 %-99 %] 99 % (04/27 0902) Weight:  [86.7  kg] 86.7 kg (04/27 0454) Physical Exam  Constitutional:  oriented to person, place, but not to time.  She is pleasant and interactive and in no distress. HENT: [place]/AT, PERRLA, no scleral icterus Mouth/Throat: Oropharynx is clear and moist. No oropharyngeal exudate.  Cardiovascular: Normal rate, regular rhythm and normal heart sounds.  Pulmonary/Chest: Effort normal and breath sounds normal. No respiratory distress.  has no wheezes.  Neck = supple, no nuchal rigidity Abdominal: Soft. Bowel sounds are normal.  exhibits no distension. There is no tenderness.  Lymphadenopathy: no cervical adenopathy. No axillary adenopathy Neurological: alert and oriented to person, place, but not time.  Able to move all 4 extremities. Skin: Her sacral wound is covered with the wound VAC.  However I reviewed the photo from today which shows a large wound with exposed muscle tissue and bone.  There is minimal necrotic tissue. Psychiatric: a normal mood and affect.  behavior is normal.    LABS: Results for orders placed or performed during the hospital encounter of 10/12/18 (from the past 48 hour(s))  Glucose, capillary     Status: Abnormal   Collection Time: 10/17/18 11:56 AM  Result Value Ref Range   Glucose-Capillary 158 (H) 70 - 99 mg/dL  Novel Coronavirus, NAA (hospital order; send-out to ref lab)     Status: None   Collection Time: 10/17/18  3:46 PM  Result Value Ref Range   SARS-CoV-2, NAA NOT DETECTED NOT DETECTED    Comment: (NOTE) This test was developed and its performance characteristics determined by [employer]. This test has not been FDA cleared or approved. This test has been authorized by FDA under an Emergency Use Authorization (EUA). This test has been validated in accordance with the FDA's Guidance Document (Policy for [place] in Laboratories Certified to Perform High Complexity Testing under CLIA prior to Emergency Use Authorization for Coronavirus [id number]  during the [facility] Emergency) issued on February 29th, 2020. FDA independent review of this validation is pending. This test is only authorized for the duration of time the declaration that circumstances exist justifying the authorization of the emergency use of in vitro diagnostic tests for detection of SARS-CoV- 2 virus and/or diagnosis of COVID-19 infection under section 564(b)(1) of the Act, 21 U.S.C. 197JOI-3(G)(5), unless the authorization is terminated or revoked sooner. Performed At: [facility] [address] [place], [place] [id number] Rush Farmer MD [id number]    [place]  Comment: Performed at [facility], [address] [place], [place] [address]  Glucose, capillary     Status: Abnormal   Collection Time: 10/17/18  4:58 PM  Result Value Ref Range   Glucose-Capillary 149 (H) 70 - 99 mg/dL   Comment 1 Notify RN   Glucose, capillary     Status: Abnormal   Collection Time: 10/17/18  9:15 PM  Result Value Ref Range   Glucose-Capillary 135 (H) 70 - 99 mg/dL  Sedimentation rate     Status: Abnormal   Collection Time: 10/18/18  4:35 AM  Result Value Ref Range   Sed Rate 36 (H) 0 - 30 mm/hr    Comment: Performed at [facility], [address] [place], [place] [address]  CBC     Status: Abnormal   Collection Time: 10/18/18  4:35 AM  Result Value Ref Range   WBC 7.5 4.0 - 10.5 K/uL   RBC 3.50 (L) 3.87 - 5.11 MIL/uL   Hemoglobin 9.6 (L) 12.0 - 15.0 g/dL   HCT 30.1 (L) 36.0 - 46.0 %   MCV 86.0 80.0 - 100.0 fL   MCH 27.4 26.0 - 34.0 pg   MCHC 31.9 30.0 - 36.0 g/dL   RDW 16.4 (H) 11.5 - 15.5 %   Platelets 326 150 - 400 K/uL   nRBC 0.0 0.0 - 0.2 %    Comment: Performed at [facility], [address] [place], [place] [address]  Basic metabolic panel     Status: Abnormal   Collection Time: 10/18/18  4:35 AM  Result Value Ref Range   Sodium 143 135 - 145 mmol/L   Potassium 2.8 (L) 3.5 - 5.1  mmol/L   Chloride 119 (H) 98 - 111 mmol/L   CO2 19 (L) 22 - 32 mmol/L   Glucose, Bld 130 (H) 70 - 99 mg/dL   BUN 13 8 - 23 mg/dL   Creatinine, Ser 0.69 0.44 - 1.00 mg/dL   Calcium 7.3 (L) 8.9 - 10.3 mg/dL   GFR calc non Af Amer >60 >60 mL/min   GFR calc Af Amer >60 >60 mL/min   Anion gap 5 5 - 15    Comment: Performed at [facility], [address] [place], [place] [address]  Glucose, capillary     Status: Abnormal   Collection Time: 10/18/18  7:39 AM  Result Value Ref Range   Glucose-Capillary 113 (H) 70 - 99 mg/dL  Glucose, capillary     Status: Abnormal   Collection Time: 10/18/18 11:53 AM  Result Value Ref Range   Glucose-Capillary 161 (H) 70 - 99 mg/dL  Glucose, capillary     Status: Abnormal   Collection Time: 10/18/18  5:29 PM  Result Value Ref Range   Glucose-Capillary 139 (H) 70 - 99 mg/dL  Glucose, capillary     Status: Abnormal   Collection Time: 10/18/18  9:16 PM  Result Value Ref Range   Glucose-Capillary 157 (H) 70 - 99 mg/dL  Basic metabolic panel     Status: Abnormal   Collection Time: 10/19/18  4:20 AM  Result Value Ref Range   Sodium 141 135 - 145 mmol/L   Potassium 4.5 3.5 - 5.1 mmol/L   Chloride 114 (H) 98 - 111 mmol/L   CO2 22 22 - 32 mmol/L   Glucose, Bld 150 (H) 70 - 99 mg/dL   BUN 16 8 - 23 mg/dL   Creatinine, Ser 0.79 0.44 - 1.00 mg/dL   Calcium 7.9 (L)  8.9 - 10.3 mg/dL   GFR calc non Af Amer >60 >60 mL/min   GFR calc Af Amer >60 >60 mL/min   Anion gap 5 5 - 15    Comment: Performed at [facility], [address] [place], [place] [address]  Magnesium     Status: Abnormal   Collection Time: 10/19/18  4:20 AM  Result Value Ref Range   Magnesium 1.6 (L) 1.7 - 2.4 mg/dL    Comment: Performed at [facility], [address] [place], [place] [address]  Glucose, capillary     Status: Abnormal   Collection Time: 10/19/18  7:43 AM  Result Value Ref Range   Glucose-Capillary 129 (H) 70 - 99 mg/dL   No components  found for: ESR, C REACTIVE PROTEIN MICRO: Recent Results (from the past 720 hour(s))  Urine Culture     Status: Abnormal   Collection Time: 10/12/18  6:10 PM  Result Value Ref Range Status   Specimen Description   Final    URINE, RANDOM Performed at [facility], [address], [place], [place] [address]    Special Requests   Final    NONE Performed at [facility], [address] [place], [place] [address]    Culture >=100,000 COLONIES/mL VIRIDANS STREPTOCOCCUS (A)  Final   Report Status 10/13/2018 FINAL  Final  Aerobic/Anaerobic Culture (surgical/deep wound)     Status: None (Preliminary result)   Collection Time: 10/16/18  8:58 AM  Result Value Ref Range Status   Specimen Description   Final    ULCER SACRAL Performed at [facility], [place] [address], [place], [place] [address]    Special Requests   Final    NONE Performed at [facility], [place], [place] [address]    Gram Stain NO WBC SEEN NO ORGANISMS SEEN   Final   Culture   Final    NO GROWTH 2 DAYS NO ANAEROBES ISOLATED; CULTURE IN PROGRESS FOR 5 DAYS Performed at [facility], [place] [address] [place], [place] [address]    Report Status PENDING  Incomplete  Novel Coronavirus, NAA (hospital order; send-out to ref lab)     Status: None   Collection Time: 10/17/18  3:46 PM  Result Value Ref Range Status   SARS-CoV-2, NAA NOT DETECTED NOT DETECTED Final    Comment: (NOTE) This test was developed and its performance characteristics determined by [employer]. This test has not been FDA cleared or approved. This test has been authorized by FDA under an Emergency Use Authorization (EUA). This test has been validated in accordance with the FDA's Guidance Document (Policy for [place] in Laboratories Certified to Perform High Complexity Testing under CLIA prior to Emergency Use Authorization for Coronavirus [id number] during the [facility]  Emergency) issued on February 29th, 2020. FDA independent review of this validation is pending. This test is only authorized for the duration of time the declaration that circumstances exist justifying the authorization of the emergency use of in vitro diagnostic tests for detection of SARS-CoV- 2 virus and/or diagnosis of COVID-19 infection under section 564(b)(1) of the Act, 21 U.S.C. 270WCB-7(S)(2), unless the authorization is terminated or revoked sooner. Performed At: [facility] [address] [place], [place] [id number] Rush Farmer MD [id number]    [place]  Final    Comment: Performed at [facility], [address] [place], [place] [address]    IMAGING: Dg Abd 1 View  Result Date: 10/14/2018 CLINICAL DATA:  Vomiting. EXAM: ABDOMEN - 1 VIEW COMPARISON:  None. FINDINGS: The bowel gas pattern is normal. No radio-opaque calculi or other significant radiographic abnormality are seen. Convex right lumbar scoliosis and multilevel degenerative change noted. IMPRESSION: No acute abnormality. Electronically Signed   By: Inge Rise M.D.   On: 10/14/2018 14:12   Ct Head Wo Contrast  Result Date: 10/12/2018 CLINICAL DATA:  Muscle weakness. EXAM: CT HEAD WITHOUT CONTRAST TECHNIQUE: Contiguous axial images were obtained from the base of the skull through the vertex without intravenous contrast. COMPARISON:  CT scan of February 25, 2012. FINDINGS: Brain: Mild diffuse cortical atrophy is noted. Mild chronic ischemic white matter disease is noted. No mass effect or midline shift is noted. Ventricular size is within normal limits. There is no evidence of mass lesion, hemorrhage or acute infarction. Vascular: No hyperdense vessel or unexpected calcification. Skull: Normal. Negative for fracture or focal lesion. Sinuses/Orbits: No acute finding. Other: None. IMPRESSION: Mild diffuse cortical atrophy. Mild chronic ischemic white matter disease. No  acute intracranial abnormality seen. Electronically Signed   By: Marijo Conception M.D.   On: 10/12/2018 18:29   Mr Pelvis W Wo Contrast  Result Date: 10/19/2018 CLINICAL DATA:  Sacral decubitus ulcer. EXAM: MRI PELVIS WITHOUT AND WITH CONTRAST TECHNIQUE: Multiplanar multisequence MR imaging of the pelvis was performed both before and after administration of intravenous contrast. CONTRAST:  8 mm Gadavist intravenous contrast. COMPARISON:  MRI left hip dated July 20, 2018. FINDINGS: Urinary Tract:  No abnormality visualized. Bowel:  Unremarkable visualized pelvic bowel loops. Vascular/Lymphatic: No pathologically enlarged lymph nodes. No significant vascular abnormality seen. Reproductive:  Prior hysterectomy.  No adnexal mass. Other:  Presacral edema.  No abscess. Musculoskeletal: Large sacral decubitus ulcer eccentric to the left with wound VAC in place. The wound extends to the sacrococcygeal junction. There is mild marrow edema in the dorsal aspect of the proximal coccyx with enhancement and indistinctness of the dorsal cortex. No acute fracture or dislocation. No joint effusion. Trace fluid in the left greater than right greater trochanteric bursae. Unchanged mild bilateral hip osteoarthritis. Mild-to-moderate diffuse muscle atrophy. IMPRESSION: 1. Large sacral decubitus ulcer extending to the sacrococcygeal junction with early osteomyelitis of the proximal coccyx. No abscess. Electronically Signed   By: Titus Dubin M.D.   On: 10/19/2018 07:59   Dg Chest Portable 1 View  Result Date: 10/12/2018 CLINICAL DATA:  Weakness. EXAM: PORTABLE CHEST 1 VIEW COMPARISON:  Radiographs of March 22, 2012. FINDINGS: The heart size and mediastinal contours are within normal limits. Both lungs are clear. No pneumothorax or pleural effusion is noted. The visualized skeletal structures are unremarkable. IMPRESSION: No active disease. Electronically Signed   By: Marijo Conception M.D.   On: 10/12/2018 17:48   [place] Ekg  Site Rite  Result Date: 10/19/2018 If Site Rite image not attached, placement could not be confirmed due to current cardiac rhythm.   Assessment:   Monica [name] is a 85 y.o. female with a history of failure to thrive, chronically largely bedbound, diabetes, hypertension, spinal stenosis admitted with sacral decubitus ulcer with necrotic tissue.  On admission white count was 13.8.  It is down to 7.5.  Sed rate is 35.  MRI does show early osteomyelitis of the proximal coccyx.  There is no abscess. She has had surgery and has a wound VAC in place.  Plan is for continued wound VAC care and placement. Cultures have been negative from the wound.  She  has grown strep viridans from her urine.  Since admission she has been on vancomycin and ceftriaxone. She has been seen by palliative care and family seems aware of the limited option for improvement but wishes to continue treatment at this point.  Recommendations I agree she has limited hope to completely heal this wound.  She does not seem a great candidate due to her multiple comorbidities and bedbound status and dementia for plastic surgery reconstruction.   At this point she would benefit from placement of a PICC line in 2 weeks of IV antibiotics with vancomycin and ceftriaxone. Following that I would suggest she be changed to doxycycline and ciprofloxacin for another 2-4 week course. This will allow time for tissue to hopefully cover the wound and treat the underlying infection of the bone. Plan was discussed with Dr. Earleen Newport who will discuss it further with family members. Thank you very much for allowing me to participate in the care of this patient. Please call with questions.   Cheral Marker. Ola Spurr, MD

## 2018-10-19 NOTE — Progress Notes (Signed)
Daily Progress Note   Patient Name: Monica [name]       Date: 10/19/2018 DOB: 06-Oct-1929  Age: 85 y.o. MRN#: [id number] Attending Physician: Loletha Grayer, MD Primary Care Physician: [name], MD Admit Date: 10/12/2018  Reason for Consultation/Follow-up: Establishing goals of care  Subjective: Patient is sitting in bed. She is conversive today. She states she is having some pain in her sacral area. She states she is being updated on her status, and tells me of the MRI last week looking at her ulcer status.    We discussed her diagnosis and prognosis. The difference between an aggressive medical intervention path and a comfort care path was discussed.  Values and goals of care important to patient and family were attempted to be elicited.  Discussed limitations of medical interventions to prolong quality of life in some situations, and discussed the concept of human mortality.  Discussed her wound and concerns for healing. Discussed possible wound progression as well as osteomyelitis.  Discussed off loading pressure, and proper nutrition for healing, as well as PT/OT.  Discussed possible further need for debridements in the future and dressing changes. Discussed possible future needs with surgery and [facility] teams. Discussed quality of life and her concerns for suffering.    She states she will need to think about her options moving forward and take things day by day. She states she believes she would like to continue with all available care at this time, but is not sure for how long if things do not go as well as she hopes. She would like time to digest this herself before speaking with family about care moving forward.        The children are surrogate decision makers if patient is  unable to make decisions as she and her children states her husband is unable.    Length of Stay: 5  Current Medications: Scheduled Meds:  . collagenase   Topical Daily  . docusate sodium  100 mg Oral BID  . enoxaparin (LOVENOX) injection  40 mg Subcutaneous Q24H  . insulin aspart  0-15 Units Subcutaneous TID WC  . insulin aspart  0-5 Units Subcutaneous QHS  . lamoTRIgine  50 mg Oral QHS  . multivitamin-lutein  1 capsule Oral Daily  . potassium chloride  40 mEq Oral TID  .  Ensure Max Protein  11 oz Oral BID BM  . sertraline  50 mg Oral Daily    Continuous Infusions: . cefTRIAXone (ROCEPHIN)  IV Stopped (10/18/18 1639)  . vancomycin 1,250 mg (10/18/18 1748)    PRN Meds: acetaminophen **OR** acetaminophen, ondansetron **OR** ondansetron (ZOFRAN) IV, oxyCODONE, QUEtiapine, ramelteon  Physical Exam Pulmonary:     Effort: Pulmonary effort is normal.  Musculoskeletal:     Comments: Lower extremity boots.   Neurological:     Mental Status: She is alert.     Comments: Conversive.              Vital Signs: BP (!) 144/84 (BP Location: Left Arm)   Pulse 100   Temp 98 F (36.7 C) (Oral)   Resp 18   Ht 5\' 7"  (1.702 m)   Wt 86.7 kg   SpO2 99%   BMI 29.94 kg/m  SpO2: SpO2: 99 % O2 Device: O2 Device: Room Air O2 Flow Rate:    Intake/output summary:   Intake/Output Summary (Last 24 hours) at 10/19/2018 1102 Last data filed at 10/19/2018 2202 Gross per 24 hour  Intake 700 ml  Output 400 ml  Net 300 ml   LBM: Last BM Date: 10/19/18 Baseline Weight: Weight: 78.9 kg Most recent weight: Weight: 86.7 kg       Palliative Assessment/Data:    Flowsheet Rows     Most Recent Value  Intake Tab  Referral Department  Hospitalist  Unit at Time of Referral  Med/Surg Unit  Date Notified  10/14/18  Palliative Care Type  New Palliative care  Reason for referral  Clarify Goals of Care  Date of Admission  10/12/18  Date first seen by Palliative Care  10/14/18  # of days  Palliative referral response time  0 Day(s)  # of days IP prior to Palliative referral  2  Clinical Assessment  Psychosocial & Spiritual Assessment  Palliative Care Outcomes      Patient Active Problem List   Diagnosis Date Noted  . Weakness 10/13/2018  . Pressure injury of skin 10/13/2018  . Spinal stenosis of lumbar region 10/06/2018  . Fecal incontinence 10/06/2018  . Type 2 diabetes mellitus with diabetic nephropathy, with long-term current use of insulin ([place]) 09/27/2018  . CKD stage 3 due to type 2 diabetes mellitus ([place]) 09/27/2018  . Delirium due to another medical condition, acute, hyperactive 09/27/2018  . Impaired ambulation 09/27/2018  . Recurrent falls 06/11/2017  . Dystrophic nail 11/13/2016  . Allergic rhinitis 09/30/2015  . Major depressive disorder, recurrent episode with anxious distress ([place]) 10/10/2013  . Vitamin D deficiency 10/08/2013  . Bilateral chronic knee pain 09/21/2011  . Tobacco abuse counseling 07/08/2011  . Incontinence of urine 06/13/2011  . History of tobacco abuse 06/11/2011  . Hyperlipidemia   . Hypertension   . COPD (chronic obstructive pulmonary disease) ([place])   . B12 deficiency   . Lung mass     Palliative Care Assessment & Plan    Recommendations/Plan:  Recommend continuing to work toward pain control of her knees and ulcer. Ultram causes confusion per her daughter.   Recommend continued psychiatry to work on depression as her medications are not controlling symptoms per patient and family.   Patient would like to continue with full scope tx.    Code Status:    Code Status Orders  (From admission, onward)         Start     Ordered   10/13/18 0458  Full code  Continuous     10/13/18 0457        Code Status History    This patient has a current code status but no historical code status.       Prognosis:   Unable to determine  Discharge Planning:  To Be Determined  Care plan was discussed with Primary MD.   Thank you for allowing the Palliative Medicine Team to assist in the care of this patient.   Total Time 35 min Prolonged Time Billed  no      Greater than 50%  of this time was spent counseling and coordinating care related to the above assessment and plan.  Asencion Gowda, NP  Please contact Palliative Medicine Team phone at [phone] for questions and concerns.

## 2018-10-19 NOTE — Progress Notes (Signed)
Peripherally Inserted Central Catheter/Midline Placement  The IV Nurse has discussed with the patient and/or persons authorized to consent for the patient, the purpose of this procedure and the potential benefits and risks involved with this procedure.  The benefits include less needle sticks, lab draws from the catheter, and the patient may be discharged home with the catheter. Risks include, but not limited to, infection, bleeding, blood clot (thrombus formation), and puncture of an artery; nerve damage and irregular heartbeat and possibility to perform a PICC exchange if needed/ordered by physician.  Alternatives to this procedure were also discussed.  Bard Power PICC patient education guide, fact sheet on infection prevention and patient information card has been provided to patient /or left at bedside.    PICC/Midline Placement Documentation  PICC Single Lumen 37/85/88 PICC Right Basilic 35 cm 0 cm (Active)  Indication for Insertion or Continuance of Line Home intravenous therapies (PICC only) 10/19/2018  4:19 PM  Exposed Catheter (cm) 0 cm 10/19/2018  4:19 PM  Site Assessment Clean;Dry;Intact 10/19/2018  4:19 PM  Line Status Flushed;Saline locked;Blood return noted 10/19/2018  4:19 PM  Dressing Type Transparent 10/19/2018  4:19 PM  Dressing Status Clean;Dry;Intact;Antimicrobial disc in place 10/19/2018  4:19 PM  Dressing Change Due 10/26/18 10/19/2018  4:19 PM       Gordan Payment 10/19/2018, 4:20 PM

## 2018-10-19 NOTE — Plan of Care (Signed)
  Problem: Education: Goal: Knowledge of General Education information will improve Description Including pain rating scale, medication(s)/side effects and non-pharmacologic comfort measures Outcome: Progressing   

## 2018-10-19 NOTE — Progress Notes (Signed)
[facility] Day(s): 5.   Post op day(s): 3 Days Post-Op.   Interval History: Patient seen and examined, no acute events or new complaints overnight. Patient reports that she is doing okay. She does report some pain and soreness in her sacral region but this is improved. No fever, chills.   Review of Systems:  Constitutional: denies fever, chills  Integumentary: + Stage 4 cacral pressure injury   Vital signs in last 24 hours: [min-max] current  Temp:  [97.8 F (36.6 C)-98.7 F (37.1 C)] 98.1 F (36.7 C) (04/27 0454) Pulse Rate:  [86-108] 95 (04/27 0454) Resp:  [16-18] 17 (04/27 0454) BP: (137-158)/(73-83) 157/83 (04/27 0454) SpO2:  [97 %-99 %] 99 % (04/27 0454) Weight:  [86.7 kg] 86.7 kg (04/27 0454)     Height: 5\' 7"  (170.2 cm) Weight: 86.7 kg BMI (Calculated): 29.93   Intake/Output this shift:  No intake/output data recorded.    Physical Exam:  Constitutional: alert, cooperative and no distress  Respiratory: breathing non-labored at rest Integumentary: sacral-coccygeal wound VAC in place, good seal, changed today at bedside see picture  Wound - 10/19/2018:       Labs:  CBC Latest Ref Rng & Units 10/18/2018 10/15/2018 10/14/2018  WBC 4.0 - 10.5 K/uL 7.5 9.3 -  Hemoglobin 12.0 - 15.0 g/dL 9.6(L) 10.2(L) 12.0  Hematocrit 36.0 - 46.0 % 30.1(L) 33.5(L) -  Platelets 150 - 400 K/uL 326 289 -   CMP Latest Ref Rng & Units 10/19/2018 10/18/2018 10/17/2018  Glucose 70 - 99 mg/dL 150(H) 130(H) 139(H)  BUN 8 - 23 mg/dL 16 13 20   Creatinine 0.44 - 1.00 mg/dL 0.79 0.69 0.78  Sodium 135 - 145 mmol/L 141 143 143  Potassium 3.5 - 5.1 mmol/L 4.5 2.8(L) 3.3(L)  Chloride 98 - 111 mmol/L 114(H) 119(H) 119(H)  CO2 22 - 32 mmol/L 22 19(L) 18(L)  Calcium 8.9 - 10.3 mg/dL 7.9(L) 7.3(L) 7.5(L)  Total Protein 6.5 - 8.1 g/dL - - -  Total Bilirubin 0.3 - 1.2 mg/dL - - -  Alkaline Phos 38 - 126 U/L - - -  AST 15 - 41 U/L - - -  ALT 0 - 44 U/L  - - -     Imaging studies: No new pertinent imaging studies   Assessment/Plan: (ICD-10's: L89.154) 85 y.o. female overall doing well from a surgical standpoint 3 Days Post-Op s/p sharp excisional debridement and placement of negative pressure wound VAC for stage 4 sacral-coccygeal pressure necrosis wound.     - Pain control prn  - Pressure offloading; frequent repositioning    - Wound vac changed at bedside today; will continue MWF schedule   - Continue IV Abx  - Agree with ID consultation  - medical management and discharge planning as per primary medical team             - DVT prophylaxis, ambulation/PT encouraged    All of the above findings and recommendations were discussed with the patient, and the medical team, and all of patient's and questions were answered to her expressed satisfaction.  -- Edison Simon, PA-C [facility] 10/19/2018, 7:28 AM [phone] M-F: 7am - 4pm

## 2018-10-20 ENCOUNTER — Inpatient Hospital Stay: Payer: Self-pay

## 2018-10-20 DIAGNOSIS — A419 Sepsis, unspecified organism: Secondary | ICD-10-CM | POA: Diagnosis not present

## 2018-10-20 DIAGNOSIS — R531 Weakness: Secondary | ICD-10-CM | POA: Diagnosis not present

## 2018-10-20 DIAGNOSIS — F329 Major depressive disorder, single episode, unspecified: Secondary | ICD-10-CM | POA: Diagnosis present

## 2018-10-20 DIAGNOSIS — M25661 Stiffness of right knee, not elsewhere classified: Secondary | ICD-10-CM | POA: Diagnosis not present

## 2018-10-20 DIAGNOSIS — N179 Acute kidney failure, unspecified: Secondary | ICD-10-CM | POA: Diagnosis not present

## 2018-10-20 DIAGNOSIS — Z9049 Acquired absence of other specified parts of digestive tract: Secondary | ICD-10-CM | POA: Diagnosis not present

## 2018-10-20 DIAGNOSIS — E7849 Other hyperlipidemia: Secondary | ICD-10-CM | POA: Diagnosis not present

## 2018-10-20 DIAGNOSIS — E118 Type 2 diabetes mellitus with unspecified complications: Secondary | ICD-10-CM | POA: Diagnosis not present

## 2018-10-20 DIAGNOSIS — R000 Tachycardia, unspecified: Secondary | ICD-10-CM | POA: Diagnosis not present

## 2018-10-20 DIAGNOSIS — W19XXXA Unspecified fall, initial encounter: Secondary | ICD-10-CM | POA: Diagnosis not present

## 2018-10-20 DIAGNOSIS — Z87891 Personal history of nicotine dependence: Secondary | ICD-10-CM | POA: Diagnosis not present

## 2018-10-20 DIAGNOSIS — L89619 Pressure ulcer of right heel, unspecified stage: Secondary | ICD-10-CM | POA: Diagnosis not present

## 2018-10-20 DIAGNOSIS — E785 Hyperlipidemia, unspecified: Secondary | ICD-10-CM | POA: Diagnosis not present

## 2018-10-20 DIAGNOSIS — I96 Gangrene, not elsewhere classified: Secondary | ICD-10-CM | POA: Diagnosis not present

## 2018-10-20 DIAGNOSIS — Z90710 Acquired absence of both cervix and uterus: Secondary | ICD-10-CM | POA: Diagnosis not present

## 2018-10-20 DIAGNOSIS — Z515 Encounter for palliative care: Secondary | ICD-10-CM | POA: Diagnosis not present

## 2018-10-20 DIAGNOSIS — R404 Transient alteration of awareness: Secondary | ICD-10-CM | POA: Diagnosis not present

## 2018-10-20 DIAGNOSIS — M48061 Spinal stenosis, lumbar region without neurogenic claudication: Secondary | ICD-10-CM | POA: Diagnosis not present

## 2018-10-20 DIAGNOSIS — Z7189 Other specified counseling: Secondary | ICD-10-CM | POA: Diagnosis not present

## 2018-10-20 DIAGNOSIS — L89159 Pressure ulcer of sacral region, unspecified stage: Secondary | ICD-10-CM | POA: Diagnosis not present

## 2018-10-20 DIAGNOSIS — I248 Other forms of acute ischemic heart disease: Secondary | ICD-10-CM | POA: Diagnosis not present

## 2018-10-20 DIAGNOSIS — N390 Urinary tract infection, site not specified: Secondary | ICD-10-CM | POA: Diagnosis not present

## 2018-10-20 DIAGNOSIS — I10 Essential (primary) hypertension: Secondary | ICD-10-CM | POA: Diagnosis not present

## 2018-10-20 DIAGNOSIS — R6520 Severe sepsis without septic shock: Secondary | ICD-10-CM | POA: Diagnosis not present

## 2018-10-20 DIAGNOSIS — M2550 Pain in unspecified joint: Secondary | ICD-10-CM | POA: Diagnosis not present

## 2018-10-20 DIAGNOSIS — M4628 Osteomyelitis of vertebra, sacral and sacrococcygeal region: Secondary | ICD-10-CM | POA: Diagnosis not present

## 2018-10-20 DIAGNOSIS — N184 Chronic kidney disease, stage 4 (severe): Secondary | ICD-10-CM | POA: Diagnosis not present

## 2018-10-20 DIAGNOSIS — E872 Acidosis: Secondary | ICD-10-CM | POA: Diagnosis not present

## 2018-10-20 DIAGNOSIS — E1169 Type 2 diabetes mellitus with other specified complication: Secondary | ICD-10-CM | POA: Diagnosis not present

## 2018-10-20 DIAGNOSIS — R262 Difficulty in walking, not elsewhere classified: Secondary | ICD-10-CM | POA: Diagnosis not present

## 2018-10-20 DIAGNOSIS — L89629 Pressure ulcer of left heel, unspecified stage: Secondary | ICD-10-CM | POA: Diagnosis not present

## 2018-10-20 DIAGNOSIS — L89154 Pressure ulcer of sacral region, stage 4: Secondary | ICD-10-CM | POA: Diagnosis not present

## 2018-10-20 DIAGNOSIS — E1140 Type 2 diabetes mellitus with diabetic neuropathy, unspecified: Secondary | ICD-10-CM | POA: Diagnosis not present

## 2018-10-20 DIAGNOSIS — Z7984 Long term (current) use of oral hypoglycemic drugs: Secondary | ICD-10-CM | POA: Diagnosis not present

## 2018-10-20 DIAGNOSIS — E1121 Type 2 diabetes mellitus with diabetic nephropathy: Secondary | ICD-10-CM | POA: Diagnosis not present

## 2018-10-20 DIAGNOSIS — J449 Chronic obstructive pulmonary disease, unspecified: Secondary | ICD-10-CM | POA: Diagnosis not present

## 2018-10-20 DIAGNOSIS — F0390 Unspecified dementia without behavioral disturbance: Secondary | ICD-10-CM | POA: Diagnosis present

## 2018-10-20 DIAGNOSIS — M6281 Muscle weakness (generalized): Secondary | ICD-10-CM | POA: Diagnosis not present

## 2018-10-20 DIAGNOSIS — Z79899 Other long term (current) drug therapy: Secondary | ICD-10-CM | POA: Diagnosis not present

## 2018-10-20 DIAGNOSIS — Z7401 Bed confinement status: Secondary | ICD-10-CM | POA: Diagnosis not present

## 2018-10-20 DIAGNOSIS — E860 Dehydration: Secondary | ICD-10-CM | POA: Diagnosis not present

## 2018-10-20 DIAGNOSIS — N183 Chronic kidney disease, stage 3 (moderate): Secondary | ICD-10-CM | POA: Diagnosis not present

## 2018-10-20 DIAGNOSIS — I471 Supraventricular tachycardia: Secondary | ICD-10-CM | POA: Diagnosis not present

## 2018-10-20 DIAGNOSIS — E119 Type 2 diabetes mellitus without complications: Secondary | ICD-10-CM | POA: Diagnosis not present

## 2018-10-20 DIAGNOSIS — Z20828 Contact with and (suspected) exposure to other viral communicable diseases: Secondary | ICD-10-CM | POA: Diagnosis present

## 2018-10-20 DIAGNOSIS — Z833 Family history of diabetes mellitus: Secondary | ICD-10-CM | POA: Diagnosis not present

## 2018-10-20 DIAGNOSIS — D649 Anemia, unspecified: Secondary | ICD-10-CM | POA: Diagnosis not present

## 2018-10-20 LAB — CREATININE, SERUM
Creatinine, Ser: 0.98 mg/dL (ref 0.44–1.00)
GFR calc Af Amer: >60 mL/min (ref >60)
GFR calc non Af Amer: 52 mL/min — ABNORMAL LOW (ref >60)

## 2018-10-20 LAB — GLUCOSE, CAPILLARY
Glucose-Capillary: 136 mg/dL — ABNORMAL HIGH (ref 70–99)
Glucose-Capillary: 240 mg/dL — ABNORMAL HIGH (ref 70–99)

## 2018-10-20 MED ORDER — VANCOMYCIN IV (FOR PTA / DISCHARGE USE ONLY): 1000.0000 mg | INTRAVENOUS | 0 refills | Status: DC

## 2018-10-20 MED ORDER — CEFTRIAXONE IV (FOR PTA / DISCHARGE USE ONLY): 2.0000 g | INTRAVENOUS | 0 refills | Status: DC

## 2018-10-20 MED ORDER — ENSURE MAX PROTEIN PO LIQD: 11.0000 [oz_av] | Freq: Two times a day (BID) | ORAL | 0 refills | Status: DC

## 2018-10-20 MED ORDER — VANCOMYCIN HCL IN DEXTROSE 750-5 MG/150ML-% IV SOLN
750.0000 mg | INTRAVENOUS | Status: DC
  Filled 2018-10-20: qty 150

## 2018-10-20 MED ORDER — ACETAMINOPHEN 325 MG PO TABS: 650.0000 mg | ORAL_TABLET | Freq: Four times a day (QID) | ORAL | 0 refills | Status: AC | PRN

## 2018-10-20 MED ORDER — DOCUSATE SODIUM 100 MG PO CAPS: 100.0000 mg | ORAL_CAPSULE | Freq: Two times a day (BID) | ORAL | 0 refills | Status: DC

## 2018-10-20 MED ORDER — METOPROLOL SUCCINATE ER 25 MG PO TB24: 25.0000 mg | ORAL_TABLET | Freq: Every day | ORAL | 0 refills | Status: DC

## 2018-10-20 MED ORDER — METOPROLOL SUCCINATE ER 50 MG PO TB24: 50.0000 mg | ORAL_TABLET | Freq: Every day | ORAL | 0 refills | Status: DC

## 2018-10-20 MED ORDER — LAMOTRIGINE 25 MG PO TABS: 50.0000 mg | ORAL_TABLET | Freq: Every day | ORAL | 0 refills | Status: DC

## 2018-10-20 MED ORDER — SODIUM CHLORIDE 0.9 % IV SOLN: 1.0000 g | INTRAVENOUS | 0 refills | Status: DC

## 2018-10-20 MED ORDER — METOPROLOL SUCCINATE ER 50 MG PO TB24: 50.0000 mg | ORAL_TABLET | Freq: Every day | ORAL | Status: DC

## 2018-10-20 MED ORDER — OXYCODONE HCL 5 MG PO TABS: 5.0000 mg | ORAL_TABLET | Freq: Four times a day (QID) | ORAL | 0 refills | Status: DC | PRN

## 2018-10-20 MED ORDER — METFORMIN HCL 500 MG PO TABS: 500.0000 mg | ORAL_TABLET | Freq: Two times a day (BID) | ORAL | 0 refills | Status: DC

## 2018-10-20 MED ORDER — COLLAGENASE 250 UNIT/GM EX OINT: TOPICAL_OINTMENT | Freq: Every day | CUTANEOUS | 0 refills | Status: DC

## 2018-10-20 MED ORDER — ENOXAPARIN SODIUM 40 MG/0.4ML ~~LOC~~ SOLN: 40.0000 mg | SUBCUTANEOUS | 0 refills | Status: DC

## 2018-10-20 MED ORDER — OCUVITE-LUTEIN PO CAPS: 1.0000 | ORAL_CAPSULE | Freq: Every day | ORAL | 0 refills | Status: DC

## 2018-10-20 MED ORDER — VANCOMYCIN HCL IN DEXTROSE 1-5 GM/200ML-% IV SOLN
1000.0000 mg | INTRAVENOUS | Status: DC
  Filled 2018-10-20: qty 200

## 2018-10-20 MED ORDER — VANCOMYCIN HCL IN DEXTROSE 1-5 GM/200ML-% IV SOLN: INTRAVENOUS | 0 refills | Status: DC

## 2018-10-20 MED ORDER — SODIUM CHLORIDE 0.9 % IV SOLN: 20.0000 mL | INTRAVENOUS | 0 refills | Status: DC | PRN

## 2018-10-20 NOTE — Discharge Summary (Signed)
[facility] NAME: Monica [name]    MR#:  [id number]  DATE OF BIRTH:  03/17/1930  DATE OF ADMISSION:  10/12/2018 ADMITTING PHYSICIAN: Harrie Foreman, MD  DATE OF DISCHARGE: 10/20/2018  PRIMARY CARE PHYSICIAN: [name], MD    ADMISSION DIAGNOSIS:  Weakness [R53.1]  DISCHARGE DIAGNOSIS:  Principal Problem:   Weakness Active Problems:   Major depressive disorder, recurrent episode with anxious distress (HCC)   Pressure injury of skin   SECONDARY DIAGNOSIS:   Past Medical History:  Diagnosis Date  . B12 deficiency   . COPD (chronic obstructive pulmonary disease) ([place])   . Diabetes mellitus   . H/O: Bell's palsy   . Hyperlipidemia   . Hypertension   . Lung mass April 2011   s/p biopsy, no cancer cells found, Repeat CT Dec 2012 unchanged    HOSPITAL COURSE:   1.  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  Appreciate infectious disease consultation recommending 2 weeks of IV Rocephin and vancomycin.  Stop date on the IV antibiotics will be after dose on May 11.  Starting May 12 then order doxycycline 100 mg twice a day and Cipro 750 mg twice a day for another 2 to 4 weeks.  Stop date will be up to infectious disease doctor Dr. Steva Ready.  weekly labs of CBC with differential CMP vancomycin trough CRP and ESR to go to Dr. Steva Ready infectious disease.  PICC line placed again today after patient pulled out the PICC line last night.  Wound VAC to be changed every 3 days.  Follow-up with general surgery as outpatient Dr. Rosana Hoes.  Stage IV sacral decubiti usually do not heal on their own and may end up needing plastic surgery and a flap procedure once infection cleared. 2.  Hypokalemia.  This has been replaced.  Patient must eat better. 3.  Heel decubiti.  Local wound care 4.  Stage I decubitus ulcer surrounding the stage IV decubitus ulcer with redness on the skin. 5.  Type 2 diabetes mellitus.  Last  hemoglobin A1c 6.5.  Diet control.  Restart metformin. 6.  Depression on Zoloft and Lamictal 7.  COVID-19 testing negative.  Test was ordered because the patient will go out to rehab.  Not because the patient had symptoms. 8.  DVT prophylaxis for now with Lovenox  DISCHARGE CONDITIONS:   Satisfactory  CONSULTS OBTAINED:  Treatment Team:  Jules Husbands, MD Herbert Pun, MD Lavella Hammock, MD Leonel Ramsay, MD  DRUG ALLERGIES:  No Known Allergies  DISCHARGE MEDICATIONS:   Allergies as of 10/20/2018   No Known Allergies     Medication List    STOP taking these medications   Accu-Chek SmartView test strip Generic drug:  glucose blood   atenolol 50 MG tablet Commonly known as:  TENORMIN   atorvastatin 80 MG tablet Commonly known as:  LIPITOR   BD Insulin Syringe U/F 30G X 1/2" 0.5 ML Misc Generic drug:  Insulin Syringe-Needle U-100   blood glucose meter kit and supplies   cyanocobalamin 1000 MCG/ML injection Commonly known as:  (VITAMIN B-12)   diclofenac sodium 1 % Gel Commonly known as:  VOLTAREN   insulin NPH Human 100 UNIT/ML injection Commonly known as:  HumuLIN N   lisinopril 40 MG tablet Commonly known as:  ZESTRIL   Pharmacist Choice Lancets Misc   QUEtiapine 25 MG tablet Commonly known as:  SEROQUEL   traMADol 50  MG tablet Commonly known as:  ULTRAM     TAKE these medications   acetaminophen 325 MG tablet Commonly known as:  TYLENOL Take 2 tablets (650 mg total) by mouth every 6 (six) hours as needed for mild pain (or Fever >/= 101). What changed:    medication strength  how much to take  when to take this  reasons to take this   cefTRIAXone 1 g in sodium chloride 0.9 % 100 mL Inject 1 g into the vein daily.   collagenase ointment Commonly known as:  SANTYL Apply topically daily. Start taking on:  October 21, 2018   docusate sodium 100 MG capsule Commonly known as:  COLACE Take 1 capsule (100 mg total) by mouth  2 (two) times daily.   enoxaparin 40 MG/0.4ML injection Commonly known as:  LOVENOX Inject 0.4 mLs (40 mg total) into the skin daily.   Ensure Max Protein Liqd Take 330 mLs (11 oz total) by mouth 2 (two) times daily between meals.   lamoTRIgine 25 MG tablet Commonly known as:  LAMICTAL Take 2 tablets (50 mg total) by mouth at bedtime. What changed:  See the new instructions.   metFORMIN 500 MG tablet Commonly known as:  GLUCOPHAGE Take 1 tablet (500 mg total) by mouth 2 (two) times daily with a meal. What changed:    medication strength  See the new instructions.   metoprolol succinate 25 MG 24 hr tablet Commonly known as:  TOPROL-XL Take 1 tablet (25 mg total) by mouth daily. Start taking on:  October 21, 2018   multivitamin-lutein Caps capsule Take 1 capsule by mouth daily. Start taking on:  October 21, 2018   oxyCODONE 5 MG immediate release tablet Commonly known as:  Oxy IR/ROXICODONE Take 1 tablet (5 mg total) by mouth every 6 (six) hours as needed for moderate pain or severe pain.   sertraline 50 MG tablet Commonly known as:  ZOLOFT Take 1 tablet (50 mg total) by mouth daily.   sodium chloride 0.9 % infusion Inject 20 mLs into the vein as needed (for administration of IV medications (carrier fluid)).   vancomycin 1-5 GM/200ML-% Soln Commonly known as:  VANCOCIN 1243m iv q24 through 11/02/2018        DISCHARGE INSTRUCTIONS:   Follow-up with Dr. at rehab 1 day Follow-up with general surgery and infectious disease as outpatient  If you experience worsening of your admission symptoms, develop shortness of breath, life threatening emergency, suicidal or homicidal thoughts you must seek medical attention immediately by calling 911 or calling your MD immediately  if symptoms less severe.  You Must read complete instructions/literature along with all the possible adverse reactions/side effects for all the Medicines you take and that have been prescribed to you.  Take any new Medicines after you have completely understood and accept all the possible adverse reactions/side effects.   Please note  You were cared for by a hospitalist during your hospital stay. If you have any questions about your discharge medications or the care you received while you were in the hospital after you are discharged, you can call the unit and asked to speak with the hospitalist on call if the hospitalist that took care of you is not available. Once you are discharged, your primary care physician will handle any further medical issues. Please note that NO REFILLS for any discharge medications will be authorized once you are discharged, as it is imperative that you return to your primary care physician (or establish a relationship  with a primary care physician if you do not have one) for your aftercare needs so that they can reassess your need for medications and monitor your lab values.    Today   CHIEF COMPLAINT:   Chief Complaint  Patient presents with  . Failure To Thrive    HISTORY OF PRESENT ILLNESS:  Monica [name]  is a 85 y.o. female with a known history of sent thin with not eating very well and found to have a stage IV decubitus ulcer   VITAL SIGNS:  Blood pressure (!) 169/93, pulse (!) 101, temperature (!) 97.5 F (36.4 C), temperature source Oral, resp. rate 18, height _0  (1.702 m), weight 79.4 kg, SpO2 97 %.   PHYSICAL EXAMINATION:  GENERAL:  85 y.o.-year-old patient lying in the bed with no acute distress.  EYES: Pupils equal, round, reactive to light and accommodation. No scleral icterus. Extraocular muscles intact.  HEENT: Head atraumatic, normocephalic. Oropharynx and nasopharynx clear.  NECK:  Supple, no jugular venous distention. No thyroid enlargement, no tenderness.  LUNGS: Normal breath sounds bilaterally, no wheezing, rales,rhonchi or crepitation. No use of accessory muscles of respiration.  CARDIOVASCULAR: S1, S2 normal. No murmurs, rubs, or  gallops.  ABDOMEN: Soft, non-tender, non-distended. Bowel sounds present. No organomegaly or mass.  EXTREMITIES: No pedal edema, cyanosis, or clubbing.  NEUROLOGIC: Cranial nerves II through XII are intact. Muscle strength 5/5 in all extremities. Sensation intact. Gait not checked.  PSYCHIATRIC: The patient is alert and oriented x 3.  SKIN: Stage I redness buttock surrounding where the wound VAC is.  Small heel decubiti  DATA REVIEW:   CBC Recent Labs  Lab 10/18/18 0435  WBC 7.5  HGB 9.6*  HCT 30.1*  PLT 326    Chemistries  Recent Labs  Lab 10/14/18 0424  10/19/18 0420 10/20/18 0619  NA 143   < > 141  --   K 4.3   < > 4.5  --   CL 118*   < > 114*  --   CO2 17*   < > 22  --   GLUCOSE 150*   < > 150*  --   BUN 59*   < > 16  --   CREATININE 1.27*   < > 0.79 0.98  CALCIUM 9.1   < > 7.9*  --   MG  --    < > 1.6*  --   AST 17  --   --   --   ALT 22  --   --   --   ALKPHOS 93  --   --   --   BILITOT 0.7  --   --   --    < > = values in this interval not displayed.     Microbiology Results  Results for orders placed or performed during the hospital encounter of 10/12/18  Urine Culture     Status: Abnormal   Collection Time: 10/12/18  6:10 PM  Result Value Ref Range Status   Specimen Description   Final    URINE, RANDOM Performed at [facility], [address], [place], [place] [address]    Special Requests   Final    NONE Performed at [facility], [address] [place], [place] [address]    Culture >=100,000 COLONIES/mL VIRIDANS STREPTOCOCCUS (A)  Final   Report Status 10/13/2018 FINAL  Final  Aerobic/Anaerobic Culture (surgical/deep wound)     Status: None (Preliminary result)   Collection Time: 10/16/18  8:58  AM  Result Value Ref Range Status   Specimen Description   Final    ULCER SACRAL Performed at [facility], [place] [address] [place], [place] [address]    Special Requests   Final    NONE Performed at [facility],  [place], [place] [address]    Gram Stain NO WBC SEEN NO ORGANISMS SEEN   Final   Culture   Final    NO GROWTH 3 DAYS NO ANAEROBES ISOLATED; CULTURE IN PROGRESS FOR 5 DAYS Performed at [facility], [address], [place], [place] [address]    Report Status PENDING  Incomplete  Novel Coronavirus, NAA (hospital order; send-out to ref lab)     Status: None   Collection Time: 10/17/18  3:46 PM  Result Value Ref Range Status   SARS-CoV-2, NAA NOT DETECTED NOT DETECTED Final    Comment: (NOTE) This test was developed and its performance characteristics determined by [employer]. This test has not been FDA cleared or approved. This test has been authorized by FDA under an Emergency Use Authorization (EUA). This test has been validated in accordance with the FDA's Guidance Document (Policy for [facility] in Laboratories Certified to Perform High Complexity Testing under CLIA prior to Emergency Use Authorization for Coronavirus [id number] during the [facility] Emergency) issued on February 29th, 2020. FDA independent review of this validation is pending. This test is only authorized for the duration of time the declaration that circumstances exist justifying the authorization of the emergency use of in vitro diagnostic tests for detection of SARS-CoV- 2 virus and/or diagnosis of COVID-19 infection under section 564(b)(1) of the Act, 21 U.S.C. 956LOV-5(I)(4), unless the authorization is terminated or revoked sooner. Performed At: [facility] [address] [place], [place] [id number] Rush Farmer MD [id number]    [place]  Final    Comment: Performed at [facility], [address] [place], [place] [address]    RADIOLOGY:  [place] Ekg Site Rite  Result Date: 10/20/2018 If [facility] image not attached, placement could not be confirmed due to current cardiac rhythm.  [place] Ekg Site Rite  Result  Date: 10/19/2018 If Site Rite image not attached, placement could not be confirmed due to current cardiac rhythm.    Management plans discussed with the patient, family and they are in agreement.  CODE STATUS:     Code Status Orders  (From admission, onward)         Start     Ordered   10/13/18 0458  Full code  Continuous     10/13/18 0457        Code Status History    This patient has a current code status but no historical code status.      TOTAL TIME TAKING CARE OF THIS PATIENT: 35 minutes.    Loletha Grayer M.D on 10/20/2018 at 10:52 AM  Between 7am to 6pm - Pager - [phone]  After 6pm go to [email or web address] - password [email or web address]  [facility] [employer] Office  [phone]  CC: Primary care physician; [name], MD

## 2018-10-20 NOTE — Progress Notes (Signed)
Daily Progress Note   Patient Name: Monica [name]       Date: 10/20/2018 DOB: [birth year]  Age: 85 y.o. MRN#: [id number] Attending Physician: Loletha Grayer, MD Primary Care Physician: [name], MD Admit Date: 10/12/2018  Reason for Consultation/Follow-up: Establishing goals of care  Subjective: Patient is sitting in bed. She is discharging today to rehab. She is happy to be discharging. No complaints presently. She will continue to take 1 day at a time. Recommend palliative to follow at D/C.     The children are surrogate decision makers if patient is unable to make decisions as she and her children states her husband is unable.    Length of Stay: 6  Current Medications: Scheduled Meds:  . collagenase   Topical Daily  . docusate sodium  100 mg Oral BID  . enoxaparin (LOVENOX) injection  40 mg Subcutaneous Q24H  . insulin aspart  0-15 Units Subcutaneous TID WC  . insulin aspart  0-5 Units Subcutaneous QHS  . lamoTRIgine  50 mg Oral QHS  . [START ON 10/21/2018] metoprolol succinate  50 mg Oral Daily  . multivitamin-lutein  1 capsule Oral Daily  . Ensure Max Protein  11 oz Oral BID BM  . sertraline  50 mg Oral Daily  . sodium chloride flush  10-40 mL Intracatheter Q12H    Continuous Infusions: . sodium chloride 10 mL/hr at 10/19/18 1727  . cefTRIAXone (ROCEPHIN)  IV 1 g (10/20/18 1315)  . vancomycin      PRN Meds: sodium chloride, acetaminophen **OR** acetaminophen, ondansetron **OR** ondansetron (ZOFRAN) IV, oxyCODONE, QUEtiapine, ramelteon, sodium chloride flush  Physical Exam Pulmonary:     Effort: Pulmonary effort is normal.  Musculoskeletal:     Comments: Lower extremity boots.   Neurological:     Mental Status: She is alert.     Comments: Conversive.           Vital Signs: BP (!) 169/93   Pulse (!) 101   Temp (!) 97.5 F (36.4 C) (Oral)   Resp 18   Ht 5\' 7"  (1.702 m)   Wt 79.4 kg   SpO2 97%   BMI 27.41 kg/m  SpO2: SpO2: 97 % O2 Device: O2 Device: Room Air O2 Flow Rate:    Intake/output summary:   Intake/Output Summary (Last 24 hours) at  10/20/2018 1322 Last data filed at 10/20/2018 0340 Gross per 24 hour  Intake 576.24 ml  Output 700 ml  Net -123.76 ml   LBM: Last BM Date: 10/19/18 Baseline Weight: Weight: 78.9 kg Most recent weight: Weight: 79.4 kg       Palliative Assessment/Data:    Flowsheet Rows     Most Recent Value  Intake Tab  Referral Department  Hospitalist  Unit at Time of Referral  Med/Surg Unit  Date Notified  10/14/18  Palliative Care Type  New Palliative care  Reason for referral  Clarify Goals of Care  Date of Admission  10/12/18  Date first seen by Palliative Care  10/14/18  # of days Palliative referral response time  0 Day(s)  # of days IP prior to Palliative referral  2  Clinical Assessment  Psychosocial & Spiritual Assessment  Palliative Care Outcomes      Patient Active Problem List   Diagnosis Date Noted  . Weakness 10/13/2018  . Pressure injury of skin 10/13/2018  . Spinal stenosis of lumbar region 10/06/2018  . Fecal incontinence 10/06/2018  . Type 2 diabetes mellitus with diabetic nephropathy, with long-term current use of insulin ([place]) 09/27/2018  . CKD stage 3 due to type 2 diabetes mellitus ([place]) 09/27/2018  . Delirium due to another medical condition, acute, hyperactive 09/27/2018  . Impaired ambulation 09/27/2018  . Recurrent falls 06/11/2017  . Dystrophic nail 11/13/2016  . Allergic rhinitis 09/30/2015  . Major depressive disorder, recurrent episode with anxious distress ([place]) 10/10/2013  . Vitamin D deficiency 10/08/2013  . Bilateral chronic knee pain 09/21/2011  . Tobacco abuse counseling 07/08/2011  . Incontinence of urine 06/13/2011  . History of tobacco abuse  06/11/2011  . Hyperlipidemia   . Hypertension   . COPD (chronic obstructive pulmonary disease) ([place])   . B12 deficiency   . Lung mass     Palliative Care Assessment & Plan    Recommendations/Plan:  Recommend continuing to work toward pain control of her knees and ulcer. Ultram causes confusion per her daughter.   Recommend continued psychiatry to work on depression as her medications are not controlling symptoms per patient and family.   Patient would like to continue with full scope tx.    Code Status:    Code Status Orders  (From admission, onward)         Start     Ordered   10/13/18 0458  Full code  Continuous     10/13/18 0457        Code Status History    This patient has a current code status but no historical code status.       Prognosis:   Unable to determine  Discharge Planning:  To Be Determined  Care plan was discussed with Primary MD.  Thank you for allowing the Palliative Medicine Team to assist in the care of this patient.   Total Time 15 min Prolonged Time Billed  no      Greater than 50%  of this time was spent counseling and coordinating care related to the above assessment and plan.  Asencion Gowda, NP  Please contact Palliative Medicine Team phone at [phone] for questions and concerns.

## 2018-10-20 NOTE — Progress Notes (Signed)
PT Cancellation Note  Patient Details Name: Monica [name] MRN: [id number] DOB: [birth year]   Cancelled Treatment:    Reason Eval/Treat Not Completed: Other (comment) Chart reviewed, Patient semirecumbent in bed, awake and alert on PT arrival. Patient expressed eagerness to discharge, but was unwilling to participate in therapy before consuming her lunch. PT discussed benefits of participation; patient politely reiterated her need to eat her lunch "to heal this wound." PT will follow-up at a later time/date.   Myles Gip PT, DPT [phone] 10/20/2018, 1:32 PM

## 2018-10-20 NOTE — Progress Notes (Signed)
Report called and given to [facility] at [facility] center. EMS called for report. PICC line in right upper arm will remain at discharge. Nursing tech getting pt dressed and awaiting transport.

## 2018-10-20 NOTE — Progress Notes (Signed)
PHARMACY CONSULT NOTE FOR:  OUTPATIENT  PARENTERAL ANTIBIOTIC THERAPY (OPAT)  Indication: sacral decubitus and osteomyelitis Regimen: vancomycin 1gm IV q24h and ceftriaxone 2gm IV q24h End date: 11/02/2018  IV antibiotic discharge orders are pended. To discharging provider:  please sign these orders via discharge navigator,  Select New Orders & click on the button choice - Manage This Unsigned Work.     Thank you for allowing pharmacy to be a part of this patient's care.  Doreene Eland, PharmD, BCPS.   Work Cell: [phone] 10/20/2018 1:46 PM

## 2018-10-20 NOTE — Consult Note (Signed)
Pharmacy Antibiotic Note  Monica [name] is a 85 y.o. female admitted on 10/12/2018 with wound infection.  Pharmacy has been consulted for Vancomycin dosing.  Previous Plan: Vancomycin 1250 mg IV Q 24 hrs. Goal AUC 400-550. Expected AUC: 455 SCr used: 0.8  New Plan (4/28): Vancomycin 1000 mg IV Q 24 hrs. Goal AUC 400-550. Expected AUC: 464 SCr used: 0.98  Weight discrepancies during admission - Called floor and spoke with Janett Billow RN to confirm weight today as 79.37kg (reported as 86.7kg at admission and 57.6kg 4/27)   Height: 5\' 7"  (170.2 cm) Weight: 175 lb (79.4 kg) IBW/kg (Calculated) : 61.6  Temp (24hrs), Avg:97.9 F (36.6 C), Min:97.5 F (36.4 C), Max:98.2 F (36.8 C)  Recent Labs  Lab 10/14/18 0424 10/15/18 0338 10/17/18 0527 10/18/18 0435 10/19/18 0420 10/20/18 0619  WBC 11.2* 9.3  --  7.5  --   --   CREATININE 1.27* 1.06* 0.78 0.69 0.79 0.98    Estimated Creatinine Clearance: 37.5 mL/min (by C-G formula based on SCr of 0.98 mg/dL).    No Known Allergies  Antimicrobials this admission: CTX 4/25 >> Vancomycin 4/25 >>  Dose adjustments this admission: 1250mg  q24 to 1000mg  q24 on 4/28 (based on weight change/renal fxn)  Microbiology results: [facility] 4/24 >> NG 3 days  UCx 4/20 > Step [facility]  COVID 4/24 NEG  Thank you for allowing pharmacy to be a part of this patient's care.  Lu Duffel, PharmD, BCPS Clinical Pharmacist 10/20/2018 8:47 AM

## 2018-10-20 NOTE — Progress Notes (Signed)
Peripherally Inserted Central Catheter/Midline Placement  The IV Nurse has discussed with the patient and/or persons authorized to consent for the patient, the purpose of this procedure and the potential benefits and risks involved with this procedure.  The benefits include less needle sticks, lab draws from the catheter, and the patient may be discharged home with the catheter. Risks include, but not limited to, infection, bleeding, blood clot (thrombus formation), and puncture of an artery; nerve damage and irregular heartbeat and possibility to perform a PICC exchange if needed/ordered by physician.  Alternatives to this procedure were also discussed.  Bard Power PICC patient education guide, fact sheet on infection prevention and patient information card has been provided to patient /or left at bedside.    PICC/Midline Placement Documentation  PICC Single Lumen 10/20/18 PICC Right Cephalic 36 cm 0 cm (Active)  Indication for Insertion or Continuance of Line Prolonged intravenous therapies 10/20/2018 10:09 AM  Exposed Catheter (cm) 0 cm 10/20/2018 10:09 AM  Site Assessment Clean;Dry;Intact 10/20/2018 10:09 AM  Line Status Flushed;Blood return noted 10/20/2018 10:09 AM  Dressing Type Transparent 10/20/2018 10:09 AM  Dressing Status Clean;Dry;Intact;Antimicrobial disc in place 10/20/2018 10:09 AM  Dressing Intervention New dressing 10/20/2018 10:09 AM  Dressing Change Due 10/27/18 10/20/2018 10:09 AM       Monica [name] 10/20/2018, 10:10 AM

## 2018-10-20 NOTE — TOC Transition Note (Addendum)
Transition of Care [facility]) - CM/SW Discharge Note   Patient Details  Name: Monica [name] MRN: [id number] Date of Birth: [birth year]  [facility] CM/SW Contact:  Shade Flood, LCSW Phone Number: 10/20/2018, 12:02 PM   Clinical Narrative:     Pt stable for dc to [facility] today per MD. Pt aware and in agreement. Updated Claiborne Billings at [facility]. Per Claiborne Billings, they have pt's wound vac ready and waiting for her and are prepared to accept her in transfer today. DC clinical sent electronically. Relayed to Claiborne Billings that MD wanted to point out that IV anbx RX's completed and being sent with pt but that once IV anbx are done, pt will need oral anbx as mentioned in Hospital Course section of the DC summary. Claiborne Billings indicated that she will specifically point this out to the RN who completes admission orders.  Updated pt's daughter, Lattie Haw, by phone. Provided SNF phone number and [facility] name to contact for SNF related questions. Pt will transfer by EMS. She is going to room 29B. Envelope prepared. RN to call report when pt ready. There are no other TOC needs for dc.  Final next level of care: [place] Barriers to Discharge: Continued Medical Work up   Patient Goals and CMS Choice Patient states their goals for this hospitalization and ongoing recovery are:: [place] Daughter says goal to get up and sit up again, get less confused      Discharge Placement   Existing PASRR number confirmed : 10/16/18          Patient [facility] bed at: [facility] Patient to be transferred to facility by: ems Name of family member notified: [name] Patient and family notified of of transfer: 10/20/18  Discharge Plan and Services   Discharge Planning Services: CM Consult                                 Social Determinants of Health (SDOH) Interventions     Readmission Risk Interventions No flowsheet data found.

## 2018-10-21 DIAGNOSIS — L89154 Pressure ulcer of sacral region, stage 4: Secondary | ICD-10-CM

## 2018-10-21 LAB — AEROBIC/ANAEROBIC CULTURE W GRAM STAIN (SURGICAL/DEEP WOUND): Gram Stain: NONE SEEN

## 2018-10-22 ENCOUNTER — Other Ambulatory Visit: Payer: Self-pay

## 2018-10-22 ENCOUNTER — Other Ambulatory Visit: Payer: Self-pay | Admitting: Internal Medicine

## 2018-10-22 ENCOUNTER — Inpatient Hospital Stay
Admission: EM | Admit: 2018-10-22 | Discharge: 2018-10-26 | DRG: 308 | Disposition: A | Discharge status: Skilled Nursing Facility | Payer: Medicare Other | Attending: Internal Medicine | Admitting: Internal Medicine

## 2018-10-22 ENCOUNTER — Emergency Department: Payer: Medicare Other

## 2018-10-22 DIAGNOSIS — Z7401 Bed confinement status: Secondary | ICD-10-CM

## 2018-10-22 DIAGNOSIS — L89154 Pressure ulcer of sacral region, stage 4: Secondary | ICD-10-CM | POA: Diagnosis not present

## 2018-10-22 DIAGNOSIS — Z20828 Contact with and (suspected) exposure to other viral communicable diseases: Secondary | ICD-10-CM | POA: Diagnosis present

## 2018-10-22 DIAGNOSIS — E860 Dehydration: Secondary | ICD-10-CM | POA: Diagnosis not present

## 2018-10-22 DIAGNOSIS — N183 Chronic kidney disease, stage 3 (moderate): Secondary | ICD-10-CM | POA: Diagnosis not present

## 2018-10-22 DIAGNOSIS — E1165 Type 2 diabetes mellitus with hyperglycemia: Secondary | ICD-10-CM | POA: Diagnosis not present

## 2018-10-22 DIAGNOSIS — Z79899 Other long term (current) drug therapy: Secondary | ICD-10-CM | POA: Diagnosis not present

## 2018-10-22 DIAGNOSIS — F0390 Unspecified dementia without behavioral disturbance: Secondary | ICD-10-CM | POA: Diagnosis present

## 2018-10-22 DIAGNOSIS — D649 Anemia, unspecified: Secondary | ICD-10-CM | POA: Diagnosis present

## 2018-10-22 DIAGNOSIS — I471 Supraventricular tachycardia: Secondary | ICD-10-CM | POA: Diagnosis not present

## 2018-10-22 DIAGNOSIS — N184 Chronic kidney disease, stage 4 (severe): Secondary | ICD-10-CM | POA: Diagnosis not present

## 2018-10-22 DIAGNOSIS — A419 Sepsis, unspecified organism: Secondary | ICD-10-CM | POA: Diagnosis present

## 2018-10-22 DIAGNOSIS — E118 Type 2 diabetes mellitus with unspecified complications: Secondary | ICD-10-CM | POA: Diagnosis not present

## 2018-10-22 DIAGNOSIS — I248 Other forms of acute ischemic heart disease: Secondary | ICD-10-CM | POA: Diagnosis not present

## 2018-10-22 DIAGNOSIS — E1169 Type 2 diabetes mellitus with other specified complication: Secondary | ICD-10-CM | POA: Diagnosis not present

## 2018-10-22 DIAGNOSIS — Z7984 Long term (current) use of oral hypoglycemic drugs: Secondary | ICD-10-CM | POA: Diagnosis not present

## 2018-10-22 DIAGNOSIS — L89629 Pressure ulcer of left heel, unspecified stage: Secondary | ICD-10-CM | POA: Diagnosis present

## 2018-10-22 DIAGNOSIS — M6281 Muscle weakness (generalized): Secondary | ICD-10-CM | POA: Diagnosis not present

## 2018-10-22 DIAGNOSIS — F329 Major depressive disorder, single episode, unspecified: Secondary | ICD-10-CM | POA: Diagnosis present

## 2018-10-22 DIAGNOSIS — M48061 Spinal stenosis, lumbar region without neurogenic claudication: Secondary | ICD-10-CM | POA: Diagnosis not present

## 2018-10-22 DIAGNOSIS — Z90710 Acquired absence of both cervix and uterus: Secondary | ICD-10-CM | POA: Diagnosis not present

## 2018-10-22 DIAGNOSIS — Z9049 Acquired absence of other specified parts of digestive tract: Secondary | ICD-10-CM | POA: Diagnosis not present

## 2018-10-22 DIAGNOSIS — L89619 Pressure ulcer of right heel, unspecified stage: Secondary | ICD-10-CM | POA: Diagnosis present

## 2018-10-22 DIAGNOSIS — M25661 Stiffness of right knee, not elsewhere classified: Secondary | ICD-10-CM | POA: Diagnosis not present

## 2018-10-22 DIAGNOSIS — W19XXXA Unspecified fall, initial encounter: Secondary | ICD-10-CM | POA: Diagnosis not present

## 2018-10-22 DIAGNOSIS — Z87891 Personal history of nicotine dependence: Secondary | ICD-10-CM

## 2018-10-22 DIAGNOSIS — Z833 Family history of diabetes mellitus: Secondary | ICD-10-CM

## 2018-10-22 DIAGNOSIS — R6520 Severe sepsis without septic shock: Secondary | ICD-10-CM

## 2018-10-22 DIAGNOSIS — R262 Difficulty in walking, not elsewhere classified: Secondary | ICD-10-CM | POA: Diagnosis not present

## 2018-10-22 DIAGNOSIS — R000 Tachycardia, unspecified: Secondary | ICD-10-CM | POA: Diagnosis not present

## 2018-10-22 DIAGNOSIS — E785 Hyperlipidemia, unspecified: Secondary | ICD-10-CM | POA: Diagnosis present

## 2018-10-22 DIAGNOSIS — M2550 Pain in unspecified joint: Secondary | ICD-10-CM | POA: Diagnosis not present

## 2018-10-22 DIAGNOSIS — E872 Acidosis: Secondary | ICD-10-CM | POA: Diagnosis not present

## 2018-10-22 DIAGNOSIS — R5381 Other malaise: Secondary | ICD-10-CM | POA: Diagnosis not present

## 2018-10-22 DIAGNOSIS — R9431 Abnormal electrocardiogram [ECG] [EKG]: Secondary | ICD-10-CM | POA: Diagnosis not present

## 2018-10-22 DIAGNOSIS — R509 Fever, unspecified: Secondary | ICD-10-CM | POA: Diagnosis not present

## 2018-10-22 DIAGNOSIS — E1121 Type 2 diabetes mellitus with diabetic nephropathy: Secondary | ICD-10-CM | POA: Diagnosis present

## 2018-10-22 DIAGNOSIS — M4628 Osteomyelitis of vertebra, sacral and sacrococcygeal region: Secondary | ICD-10-CM | POA: Diagnosis not present

## 2018-10-22 DIAGNOSIS — I10 Essential (primary) hypertension: Secondary | ICD-10-CM | POA: Diagnosis present

## 2018-10-22 DIAGNOSIS — J449 Chronic obstructive pulmonary disease, unspecified: Secondary | ICD-10-CM | POA: Diagnosis present

## 2018-10-22 DIAGNOSIS — N390 Urinary tract infection, site not specified: Secondary | ICD-10-CM | POA: Diagnosis not present

## 2018-10-22 DIAGNOSIS — E1140 Type 2 diabetes mellitus with diabetic neuropathy, unspecified: Secondary | ICD-10-CM | POA: Diagnosis not present

## 2018-10-22 DIAGNOSIS — N179 Acute kidney failure, unspecified: Secondary | ICD-10-CM | POA: Diagnosis not present

## 2018-10-22 DIAGNOSIS — E119 Type 2 diabetes mellitus without complications: Secondary | ICD-10-CM | POA: Diagnosis not present

## 2018-10-22 DIAGNOSIS — L89159 Pressure ulcer of sacral region, unspecified stage: Secondary | ICD-10-CM | POA: Diagnosis not present

## 2018-10-22 DIAGNOSIS — E7849 Other hyperlipidemia: Secondary | ICD-10-CM | POA: Diagnosis not present

## 2018-10-22 HISTORY — DX: Osteomyelitis, unspecified: M86.9

## 2018-10-22 LAB — CBC WITH DIFFERENTIAL/PLATELET
Abs Immature Granulocytes: 0.38 10*3/uL — ABNORMAL HIGH (ref 0.00–0.07)
Basophils Absolute: 0.1 10*3/uL (ref 0.0–0.1)
Basophils Relative: 0 %
Eosinophils Absolute: 0 10*3/uL (ref 0.0–0.5)
Eosinophils Relative: 0 %
HCT: 33.8 % — ABNORMAL LOW (ref 36.0–46.0)
Hemoglobin: 10.5 g/dL — ABNORMAL LOW (ref 12.0–15.0)
Immature Granulocytes: 2 %
Lymphocytes Relative: 9 %
Lymphs Abs: 1.4 10*3/uL (ref 0.7–4.0)
MCH: 27.8 pg (ref 26.0–34.0)
MCHC: 31.1 g/dL (ref 30.0–36.0)
MCV: 89.4 fL (ref 80.0–100.0)
Monocytes Absolute: 1.9 10*3/uL — ABNORMAL HIGH (ref 0.1–1.0)
Monocytes Relative: 12 %
Neutro Abs: 12.1 10*3/uL — ABNORMAL HIGH (ref 1.7–7.7)
Neutrophils Relative %: 77 %
Platelets: 411 10*3/uL — ABNORMAL HIGH (ref 150–400)
RBC: 3.78 MIL/uL — ABNORMAL LOW (ref 3.87–5.11)
RDW: 18.1 % — ABNORMAL HIGH (ref 11.5–15.5)
WBC: 15.9 10*3/uL — ABNORMAL HIGH (ref 4.0–10.5)
nRBC: 0 % (ref 0.0–0.2)

## 2018-10-22 LAB — BASIC METABOLIC PANEL
Anion gap: 11 (ref 5–15)
BUN: 19 mg/dL (ref 8–23)
CO2: 22 mmol/L (ref 22–32)
Calcium: 8.3 mg/dL — ABNORMAL LOW (ref 8.9–10.3)
Chloride: 105 mmol/L (ref 98–111)
Creatinine, Ser: 1.21 mg/dL — ABNORMAL HIGH (ref 0.44–1.00)
GFR calc Af Amer: 47 mL/min — ABNORMAL LOW (ref >60)
GFR calc non Af Amer: 40 mL/min — ABNORMAL LOW (ref >60)
Glucose, Bld: 212 mg/dL — ABNORMAL HIGH (ref 70–99)
Potassium: 4.1 mmol/L (ref 3.5–5.1)
Sodium: 138 mmol/L (ref 135–145)

## 2018-10-22 LAB — LACTIC ACID, PLASMA: Lactic Acid, Venous: 3 mmol/L (ref 0.5–1.9)

## 2018-10-22 LAB — TROPONIN I
Troponin I: 0.13 ng/mL (ref <0.03)
Troponin I: 0.27 ng/mL (ref <0.03)

## 2018-10-22 LAB — GLUCOSE, CAPILLARY: Glucose-Capillary: 201 mg/dL — ABNORMAL HIGH (ref 70–99)

## 2018-10-22 LAB — VANCOMYCIN, RANDOM: Vancomycin Rm: 24

## 2018-10-22 MED ORDER — ACETAMINOPHEN 325 MG PO TABS
650.0000 mg | ORAL_TABLET | Freq: Once | ORAL | Status: AC
  Administered 2018-10-22: 20:00:00 650 mg via ORAL
  Filled 2018-10-22: qty 2

## 2018-10-22 MED ORDER — INSULIN ASPART 100 UNIT/ML ~~LOC~~ SOLN
0.0000 [IU] | Freq: Three times a day (TID) | SUBCUTANEOUS | Status: DC
  Administered 2018-10-23: 1 [IU] via SUBCUTANEOUS
  Administered 2018-10-23: 2 [IU] via SUBCUTANEOUS
  Administered 2018-10-23: 12:00:00 3 [IU] via SUBCUTANEOUS
  Administered 2018-10-24: 1 [IU] via SUBCUTANEOUS
  Administered 2018-10-24: 3 [IU] via SUBCUTANEOUS
  Administered 2018-10-24 – 2018-10-25 (×2): 2 [IU] via SUBCUTANEOUS
  Administered 2018-10-25: 7 [IU] via SUBCUTANEOUS
  Administered 2018-10-25: 3 [IU] via SUBCUTANEOUS
  Administered 2018-10-26 (×2): 2 [IU] via SUBCUTANEOUS
  Filled 2018-10-22 (×11): qty 1

## 2018-10-22 MED ORDER — SODIUM CHLORIDE 0.9 % IV SOLN
Freq: Once | INTRAVENOUS | Status: AC
  Administered 2018-10-22: 22:00:00 via INTRAVENOUS

## 2018-10-22 MED ORDER — OCUVITE-LUTEIN PO CAPS
1.0000 | ORAL_CAPSULE | Freq: Every day | ORAL | Status: DC
  Administered 2018-10-23 – 2018-10-26 (×4): 1 via ORAL
  Filled 2018-10-22 (×4): qty 1

## 2018-10-22 MED ORDER — METOPROLOL SUCCINATE ER 50 MG PO TB24
50.0000 mg | ORAL_TABLET | Freq: Every day | ORAL | Status: DC
  Administered 2018-10-23 – 2018-10-26 (×4): 50 mg via ORAL
  Filled 2018-10-22 (×4): qty 1

## 2018-10-22 MED ORDER — VANCOMYCIN HCL 10 G IV SOLR: 1750.0000 mg | Freq: Once | INTRAVENOUS | Status: DC

## 2018-10-22 MED ORDER — ENOXAPARIN SODIUM 40 MG/0.4ML ~~LOC~~ SOLN
40.0000 mg | SUBCUTANEOUS | Status: DC
  Administered 2018-10-23 – 2018-10-26 (×4): 40 mg via SUBCUTANEOUS
  Filled 2018-10-22 (×4): qty 0.4

## 2018-10-22 MED ORDER — ALBUTEROL SULFATE (2.5 MG/3ML) 0.083% IN NEBU: 2.5000 mg | INHALATION_SOLUTION | RESPIRATORY_TRACT | Status: DC | PRN

## 2018-10-22 MED ORDER — DOCUSATE SODIUM 100 MG PO CAPS
100.0000 mg | ORAL_CAPSULE | Freq: Two times a day (BID) | ORAL | Status: DC
  Administered 2018-10-22 – 2018-10-24 (×4): 100 mg via ORAL
  Filled 2018-10-22 (×6): qty 1

## 2018-10-22 MED ORDER — OXYCODONE HCL 5 MG PO TABS
5.0000 mg | ORAL_TABLET | Freq: Four times a day (QID) | ORAL | Status: DC | PRN
  Administered 2018-10-23 – 2018-10-26 (×4): 5 mg via ORAL
  Filled 2018-10-22 (×4): qty 1

## 2018-10-22 MED ORDER — ONDANSETRON HCL 4 MG PO TABS: 4.0000 mg | ORAL_TABLET | Freq: Four times a day (QID) | ORAL | Status: DC | PRN

## 2018-10-22 MED ORDER — ACETAMINOPHEN 325 MG PO TABS: 650.0000 mg | ORAL_TABLET | Freq: Four times a day (QID) | ORAL | Status: DC | PRN

## 2018-10-22 MED ORDER — SERTRALINE HCL 50 MG PO TABS
50.0000 mg | ORAL_TABLET | Freq: Every day | ORAL | Status: DC
  Administered 2018-10-23 – 2018-10-26 (×4): 50 mg via ORAL
  Filled 2018-10-22 (×4): qty 1

## 2018-10-22 MED ORDER — PIPERACILLIN-TAZOBACTAM 3.375 G IVPB
3.3750 g | Freq: Three times a day (TID) | INTRAVENOUS | Status: DC
  Administered 2018-10-23 – 2018-10-26 (×11): 3.375 g via INTRAVENOUS
  Filled 2018-10-22 (×11): qty 50

## 2018-10-22 MED ORDER — ACETAMINOPHEN 650 MG RE SUPP: 650.0000 mg | Freq: Four times a day (QID) | RECTAL | Status: DC | PRN

## 2018-10-22 MED ORDER — PIPERACILLIN-TAZOBACTAM 3.375 G IVPB 30 MIN
3.3750 g | Freq: Once | INTRAVENOUS | Status: AC
  Administered 2018-10-22: 18:00:00 3.375 g via INTRAVENOUS
  Filled 2018-10-22: qty 50

## 2018-10-22 MED ORDER — ONDANSETRON HCL 4 MG/2ML IJ SOLN: 4.0000 mg | Freq: Four times a day (QID) | INTRAMUSCULAR | Status: DC | PRN

## 2018-10-22 MED ORDER — SODIUM CHLORIDE 0.9 % IV BOLUS
1000.0000 mL | Freq: Once | INTRAVENOUS | Status: AC
  Administered 2018-10-22: 1000 mL via INTRAVENOUS

## 2018-10-22 MED ORDER — ACETAMINOPHEN 325 MG PO TABS
650.0000 mg | ORAL_TABLET | Freq: Four times a day (QID) | ORAL | Status: DC | PRN
  Administered 2018-10-23 – 2018-10-26 (×3): 650 mg via ORAL
  Filled 2018-10-22 (×3): qty 2

## 2018-10-22 MED ORDER — LAMOTRIGINE 100 MG PO TABS
50.0000 mg | ORAL_TABLET | Freq: Every day | ORAL | Status: DC
  Administered 2018-10-22 – 2018-10-25 (×4): 50 mg via ORAL
  Filled 2018-10-22 (×4): qty 1

## 2018-10-22 MED ORDER — VANCOMYCIN VARIABLE DOSE PER UNSTABLE RENAL FUNCTION (PHARMACIST DOSING): Status: DC

## 2018-10-22 MED ORDER — ENSURE MAX PROTEIN PO LIQD
11.0000 [oz_av] | Freq: Two times a day (BID) | ORAL | Status: DC
  Administered 2018-10-23 – 2018-10-26 (×6): 11 [oz_av] via ORAL
  Filled 2018-10-22: qty 330

## 2018-10-22 NOTE — Consult Note (Signed)
Subjective:   CC: sacral wound  HPI:  Monica [name] is a 85 y.o. female who was consulted by Posey Pronto for evaluation of above.  Recent debridement by Dr. Rosana Hoes about a week ago, reported to have wound VAC in place prior to transfer to ED for possible sepsis.  Consulted for wound check.  Per ED provider note,   "Areas of the decubitus ulcer looks well appearing considering how recent the debridement was, there is no pus, no crepitus, there is a small area of cellulitis surrounding it."  Pt denies any pain to area.   Past Medical History:  has a past medical history of B12 deficiency, COPD (chronic obstructive pulmonary disease) ([place]), Diabetes mellitus, H/O: Bell's palsy, Hyperlipidemia, Hypertension, Lung mass (April 2011), and Osteomyelitis [facility]).  Past Surgical History:  has a past surgical history that includes Cholecystectomy; Spine surgery (2001); Abdominal hysterectomy; Lung biopsy (2012); Incision and drainage of wound (N/A, 9/73/5329); and Application if wound vac (N/A, 10/16/2018).  Family History: family history includes Arthritis in her sister; Cancer (age of onset: 67) in her mother; Diabetes in her mother; Heart disease in her brother and sister; Heart disease (age of onset: 79) in her father.  Social History:  reports that she has quit smoking. Her smoking use included cigarettes. She has a 2.50 pack-year smoking history. She has never used smokeless tobacco. She reports that she does not drink alcohol or use drugs.  Current Medications:  Medications Prior to Admission  Medication Sig Dispense Refill  . cefTRIAXone (ROCEPHIN) IVPB Inject 2 g into the vein daily for 12 days. Doses due at 14:00 Indication:  Sacral decubitus and osteomyelitis Last Day of Therapy:  11/02/2018 Labs - Once weekly:  CBC/D and BMP, Labs - Every other week:  ESR and CRP 12 Units 0  . collagenase (SANTYL) ointment Apply topically daily. 15 g 0  . docusate sodium (COLACE) 100 MG capsule Take 1 capsule (100  mg total) by mouth 2 (two) times daily. 60 capsule 0  . enoxaparin (LOVENOX) 40 MG/0.4ML injection Inject 0.4 mLs (40 mg total) into the skin daily. 30 Syringe 0  . lamoTRIgine (LAMICTAL) 25 MG tablet Take 2 tablets (50 mg total) by mouth at bedtime. 30 tablet 0  . metFORMIN (GLUCOPHAGE) 500 MG tablet Take 1 tablet (500 mg total) by mouth 2 (two) times daily with a meal. 60 tablet 0  . metoprolol succinate (TOPROL-XL) 50 MG 24 hr tablet Take 1 tablet (50 mg total) by mouth daily. Take with or immediately following a meal. 30 tablet 0  . multivitamin-lutein (OCUVITE-LUTEIN) CAPS capsule Take 1 capsule by mouth daily. 30 capsule 0  . oxyCODONE (OXY IR/ROXICODONE) 5 MG immediate release tablet Take 1 tablet (5 mg total) by mouth every 6 (six) hours as needed for moderate pain or severe pain. 20 tablet 0  . sertraline (ZOLOFT) 50 MG tablet Take 1 tablet (50 mg total) by mouth daily. 90 tablet 1  . sodium chloride 0.9 % infusion Inject 20 mLs into the vein as needed (for administration of IV medications (carrier fluid)). 1000 mL 0  . vancomycin IVPB Inject 1,000 mg into the vein daily for 13 days. Doses due at 18:00 will need vancomycin trough 4/30 Indication: sacral decubitus and osteomyelitis Last Day of Therapy:  11/02/2018 Labs - Sunday/Monday:  CBC/D, BMP, and vancomycin trough. Labs - Thursday:  BMP and vancomycin trough Labs - Every other week:  ESR and CRP 13 Units 0  . acetaminophen (TYLENOL) 325 MG tablet  Take 2 tablets (650 mg total) by mouth every 6 (six) hours as needed for mild pain (or Fever >/= 101). 30 tablet 0  . Ensure Max Protein (ENSURE MAX PROTEIN) LIQD Take 330 mLs (11 oz total) by mouth 2 (two) times daily between meals. 330 mL 0    Allergies:  No Known Allergies  ROS:  General: Denies weight loss, weight gain, fatigue, fevers, chills, and night sweats. GI: Denies change in appetite, heartburn, nausea, vomiting, constipation, diarrhea, and blood in stool. GU: Denies  difficulty urinating, pain with urinating, urgency, frequency, blood in urine. Musculoskeletal: Denies muscle weakness. Skin: Denies rash, itching, mass, tumors,    Objective:     BP 115/61 (BP Location: Left Arm)   Pulse (!) 103   Temp 98.3 F (36.8 C) (Oral)   Resp 19   Ht '5\' 7"'  (1.702 m)   Wt 73.9 kg   SpO2 99%   BMI 25.53 kg/m   Constitutional :  alert, cooperative, appears stated age and no distress  Skin: Decubitus ulcer down to periosteum with clean base, no odor, minimal exduate, and no tunnelling along wound edge  Psychiatric: Normal affect, non-agitated, not confused       LABS:  CMP Latest Ref Rng & Units 10/22/2018 10/20/2018 10/19/2018  Glucose 70 - 99 mg/dL 212(H) - 150(H)  BUN 8 - 23 mg/dL 19 - 16  Creatinine 0.44 - 1.00 mg/dL 1.21(H) 0.98 0.79  Sodium 135 - 145 mmol/L 138 - 141  Potassium 3.5 - 5.1 mmol/L 4.1 - 4.5  Chloride 98 - 111 mmol/L 105 - 114(H)  CO2 22 - 32 mmol/L 22 - 22  Calcium 8.9 - 10.3 mg/dL 8.3(L) - 7.9(L)  Total Protein 6.5 - 8.1 g/dL - - -  Total Bilirubin 0.3 - 1.2 mg/dL - - -  Alkaline Phos 38 - 126 U/L - - -  AST 15 - 41 U/L - - -  ALT 0 - 44 U/L - - -   CBC Latest Ref Rng & Units 10/22/2018 10/18/2018 10/15/2018  WBC 4.0 - 10.5 K/uL 15.9(H) 7.5 9.3  Hemoglobin 12.0 - 15.0 g/dL 10.5(L) 9.6(L) 10.2(L)  Hematocrit 36.0 - 46.0 % 33.8(L) 30.1(L) 33.5(L)  Platelets 150 - 400 K/uL 411(H) 326 289      Assessment:     sacral wound stage IV sepsis   Plan:    agree with ED provider initial assessment of clean stage IV sacral ulcer.  Not cause of her sepsis.  Recommend continued workup for sepsis source and IV abx management per primary and ID team.  Continue with wound vac as previously prescribed.   I will notify Dr. Rosana Hoes of admission and transfer care to his team in the morning.

## 2018-10-22 NOTE — ED Triage Notes (Addendum)
Pt in via EMS from [facility]; PICC line in place to receive antibiotics for osteomyelitis; DM/COPD/UTI/Depression/Kidney dx/weakness history; EKG from facility HR 150-190 but dated in January (facility claims was taken today; EMS EKG at facility HR 105 ST).

## 2018-10-22 NOTE — ED Notes (Signed)
Zack NT to assist in drawing blood cultures.

## 2018-10-22 NOTE — ED Notes (Signed)
EDP Veronese notified in person of Lactic 3.

## 2018-10-22 NOTE — ED Notes (Addendum)
EDP Veronese notified of Trop 0.13 in person. Pt repositioned in bed.

## 2018-10-22 NOTE — Progress Notes (Signed)
Family Meeting Note  Advance Directive yes Today a meeting took place with the patient patient comes in with tachycardia diaphoresis admitted for sepsis. She has stage IV sacral decubitus/osteomyelitis underwent extensive debridement recently. She has bilateral heel ulcers. Code status address she is a full code.  Palliative care as needed Time spent during discussion: 16 minutes Fritzi Mandes, MD

## 2018-10-22 NOTE — ED Notes (Addendum)
Lattie Haw pt's daughter updated briefly per verbal request from pt. Used number from demographics. Sacral dressing changed. Wound Pink/red/yellow. Deep. Site cleansed with irrigating saline and wet-to-dry dressing applied. Pt had soft brown BM. Peri care completed.

## 2018-10-22 NOTE — Progress Notes (Signed)
Pharmacy Antibiotic Note  Monica [name] is a 85 y.o. female admitted on 10/22/2018 with sepsis.  Pharmacy has been consulted for Vancomycin, Zosyn dosing.  Plan: Zosyn 3.375 gm IV X 1 given over 30 min in ED on 4/30 @ 1800.+ Zosyn 3.375 gm IV Q8H EI ordered to start on 5/1 @ 0000.  Pt was on vancomycin 1 gm IV Q24H PTA for stage IV sacral decubitus/osteomyelitis.  Additionally, pt's SrCr has increased by 50% over past 3 days.  Random Vanc on 4/30 @ 1908 = 24 mcg/mL Will not give additional vancomycin at this time. Based on current lab values, pt's Vanc T1/2 is 22.1 hrs.  Therefore, would not expect to see vanc levels fall to within therapeutic range until 5/1 PM .  Renal function may improve overnight.   Will order another random Vanc level for 5/1 @ 0800 to ensure pt is clearing vancomycin as expected.   Height: 5\' 7"  (170.2 cm) Weight: 163 lb (73.9 kg) IBW/kg (Calculated) : 61.6  Temp (24hrs), Avg:98.5 F (36.9 C), Min:98.5 F (36.9 C), Max:98.5 F (36.9 C)  Recent Labs  Lab 10/17/18 0527 10/18/18 0435 10/19/18 0420 10/20/18 0619 10/22/18 1625 10/22/18 1908  WBC  --  7.5  --   --  15.9*  --   CREATININE 0.78 0.69 0.79 0.98 1.21*  --   LATICACIDVEN  --   --   --   --  3.0*  --   VANCORANDOM  --   --   --   --   --  24    Estimated Creatinine Clearance: 32.5 mL/min (A) (by C-G formula based on SCr of 1.21 mg/dL (H)).    No Known Allergies  Antimicrobials this admission:   >>    >>   Dose adjustments this admission:   Microbiology results:  BCx:   UCx:    Sputum:    MRSA PCR:   Thank you for allowing pharmacy to be a part of this patient's care.  Traniya Prichett D 10/22/2018 8:09 PM

## 2018-10-22 NOTE — ED Notes (Addendum)
1 set cultures off L ac area (butterfly by Zack NT); 1 set taken from R arm PICC line [facility], RN) per EDP Alfred Levins verbal order.

## 2018-10-22 NOTE — ED Provider Notes (Signed)
[facility] Emergency Department Provider Note  ____________________________________________  Time seen: Approximately 5:11 PM  I have reviewed the triage vital signs and the nursing notes.   HISTORY  Chief Complaint Tachycardia   HPI Monica [name] is a 85 y.o. female with a history of sacral osteomyelitis currently with a PICC line receiving IV Rocephin and vancomycin, status post debridement 6 days ago, COPD, diabetes, hypertension, hyperlipidemia, anemia who presents for evaluation of tachycardia.  Per facility patient was found to have heart rate between 150 and 190 which prompted her visit to the emergency room.  Patient reports occasional nausea but otherwise has no chest pain, no shortness of breath, has had no fevers, no vomiting, no diarrhea, no dysuria, no abdominal pain.  Patient denies pain in her decubitus ulcer area.  Has been receiving antibiotics as prescribed.  Past Medical History:  Diagnosis Date  . B12 deficiency   . COPD (chronic obstructive pulmonary disease) ([place])   . Diabetes mellitus   . H/O: Bell's palsy   . Hyperlipidemia   . Hypertension   . Lung mass April 2011   s/p biopsy, no cancer cells found, Repeat CT Dec 2012 unchanged  . Osteomyelitis [facility])     Patient Active Problem List   Diagnosis Date Noted  . Stage IV pressure ulcer of sacral region ([place])   . Weakness 10/13/2018  . Pressure injury of skin 10/13/2018  . Spinal stenosis of lumbar region 10/06/2018  . Fecal incontinence 10/06/2018  . Type 2 diabetes mellitus with diabetic nephropathy, with long-term current use of insulin ([place]) 09/27/2018  . CKD stage 3 due to type 2 diabetes mellitus ([place]) 09/27/2018  . Delirium due to another medical condition, acute, hyperactive 09/27/2018  . Impaired ambulation 09/27/2018  . Recurrent falls 06/11/2017  . Dystrophic nail 11/13/2016  . Allergic rhinitis 09/30/2015  . Major depressive disorder, recurrent episode with anxious  distress ([place]) 10/10/2013  . Vitamin D deficiency 10/08/2013  . Bilateral chronic knee pain 09/21/2011  . Tobacco abuse counseling 07/08/2011  . Incontinence of urine 06/13/2011  . History of tobacco abuse 06/11/2011  . Hyperlipidemia   . Hypertension   . COPD (chronic obstructive pulmonary disease) ([place])   . B12 deficiency   . Lung mass     Past Surgical History:  Procedure Laterality Date  . ABDOMINAL HYSTERECTOMY    . APPLICATION OF WOUND VAC N/A 10/16/2018   Procedure: APPLICATION OF WOUND VAC;  Surgeon: [name], MD;  Location: [facility] ORS;  Service: General;  Laterality: N/A;  . CHOLECYSTECTOMY    . INCISION AND DRAINAGE OF WOUND N/A 10/16/2018   Procedure: IRRIGATION AND DEBRIDEMENT WOUND;  Surgeon: [name], MD;  Location: [facility] ORS;  Service: General;  Laterality: N/A;  . LUNG BIOPSY  2012   for positive PET, negative biopsy for malignancy  . SPINE SURGERY  2001   Lumbar    Prior to Admission medications   Medication Sig Start Date End Date Taking? Authorizing Provider  acetaminophen (TYLENOL) 325 MG tablet Take 2 tablets (650 mg total) by mouth every 6 (six) hours as needed for mild pain (or Fever >/= 101). 10/20/18   Wieting, Richard, MD  cefTRIAXone (ROCEPHIN) IVPB Inject 2 g into the vein daily for 12 days. Doses due at 14:00 Indication:  Sacral decubitus and osteomyelitis Last Day of Therapy:  11/02/2018 Labs - Once weekly:  CBC/D and BMP, Labs - Every other week:  ESR and CRP 10/21/18 11/02/18  Wieting,  Richard, MD  collagenase (SANTYL) ointment Apply topically daily. 10/21/18   Loletha Grayer, MD  docusate sodium (COLACE) 100 MG capsule Take 1 capsule (100 mg total) by mouth 2 (two) times daily. 10/20/18   Loletha Grayer, MD  enoxaparin (LOVENOX) 40 MG/0.4ML injection Inject 0.4 mLs (40 mg total) into the skin daily. 10/20/18   Loletha Grayer, MD  Ensure Max Protein (ENSURE MAX PROTEIN) LIQD Take 330 mLs (11 oz total) by mouth 2 (two) times daily  between meals. 10/20/18   Loletha Grayer, MD  lamoTRIgine (LAMICTAL) 25 MG tablet Take 2 tablets (50 mg total) by mouth at bedtime. 10/20/18   Loletha Grayer, MD  metFORMIN (GLUCOPHAGE) 500 MG tablet Take 1 tablet (500 mg total) by mouth 2 (two) times daily with a meal. 10/20/18   Loletha Grayer, MD  metoprolol succinate (TOPROL-XL) 50 MG 24 hr tablet Take 1 tablet (50 mg total) by mouth daily. Take with or immediately following a meal. 10/21/18   Loletha Grayer, MD  multivitamin-lutein [facility]) CAPS capsule Take 1 capsule by mouth daily. 10/21/18   Loletha Grayer, MD  oxyCODONE (OXY IR/ROXICODONE) 5 MG immediate release tablet Take 1 tablet (5 mg total) by mouth every 6 (six) hours as needed for moderate pain or severe pain. 10/20/18   Loletha Grayer, MD  sertraline (ZOLOFT) 50 MG tablet Take 1 tablet (50 mg total) by mouth daily. 09/25/18   [name], MD  sodium chloride 0.9 % infusion Inject 20 mLs into the vein as needed (for administration of IV medications (carrier fluid)). 10/20/18   Loletha Grayer, MD  vancomycin IVPB Inject 1,000 mg into the vein daily for 13 days. Doses due at 18:00 will need vancomycin trough 4/30 Indication: sacral decubitus and osteomyelitis Last Day of Therapy:  11/02/2018 Labs - Sunday/Monday:  CBC/D, BMP, and vancomycin trough. Labs - Thursday:  BMP and vancomycin trough Labs - Every other week:  ESR and CRP 10/20/18 11/02/18  Loletha Grayer, MD    Allergies Patient has no known allergies.  Family History  Problem Relation Age of Onset  . Cancer Mother 54       Breast  . Diabetes Mother   . Heart disease Father 32       Acute MI  . Heart disease Sister   . Arthritis Sister   . Heart disease Brother     Social History Social History   Tobacco Use  . Smoking status: Former Smoker    Packs/day: 0.50    Years: 5.00    Pack years: 2.50    Types: Cigarettes  . Smokeless tobacco: Never Used  . Tobacco comment: quit approx 2016   Substance Use Topics  . Alcohol use: No  . Drug use: No    Review of Systems  Constitutional: Negative for fever. Eyes: Negative for visual changes. ENT: Negative for sore throat. Neck: No neck pain  Cardiovascular: Negative for chest pain. + tachycardia  Respiratory: Negative for shortness of breath. Gastrointestinal: Negative for abdominal pain, vomiting or diarrhea. + nausea Genitourinary: Negative for dysuria. Musculoskeletal: Negative for back pain. Skin: Negative for rash. Neurological: Negative for headaches, weakness or numbness. Psych: No SI or HI  ____________________________________________   PHYSICAL EXAM:  VITAL SIGNS: ED Triage Vitals  Enc Vitals Group     BP 10/22/18 1617 114/68     Pulse Rate 10/22/18 1617 (!) 109     Resp 10/22/18 1613 (!) 24     Temp 10/22/18 1611 98.5 F (36.9 C)  Temp Source 10/22/18 1611 Oral     SpO2 10/22/18 1617 98 %     Weight 10/22/18 1614 163 lb (73.9 kg)     Height 10/22/18 1614 '5\' 7"'$  (1.702 m)     Head Circumference --      Peak Flow --      Pain Score 10/22/18 1614 0     Pain Loc --      Pain Edu? --      Excl. in [place]? --     Constitutional: Alert and oriented. Well appearing and in no apparent distress. HEENT:      Head: Normocephalic and atraumatic.         Eyes: Conjunctivae are normal. Sclera is non-icteric.       Mouth/Throat: Mucous membranes are moist.       Neck: Supple with no signs of meningismus. Cardiovascular: Tachycardia with regular rhythm. No murmurs, gallops, or rubs. 2+ symmetrical distal pulses are present in all extremities. No JVD. Respiratory: Normal respiratory effort. Lungs are clear to auscultation bilaterally. No wheezes, crackles, or rhonchi.  Gastrointestinal: Soft, non tender, and non distended with positive bowel sounds. No rebound or guarding. Genitourinary: Large debridement of decubitus ulcer with small area of surrounding erythema, no evidence of purulent discharge  Musculoskeletal: Nontender with normal range of motion in all extremities. No edema, cyanosis, or erythema of extremities. Neurologic: Normal speech and language. Face is symmetric. Moving all extremities. No gross focal neurologic deficits are appreciated. Skin: Skin is warm, dry and intact. No rash noted. Psychiatric: Mood and affect are normal. Speech and behavior are normal.  ____________________________________________   LABS (all labs ordered are listed, but only abnormal results are displayed)  Labs Reviewed  CBC WITH DIFFERENTIAL/PLATELET - Abnormal; Notable for the following components:      Result Value   WBC 15.9 (*)    RBC 3.78 (*)    Hemoglobin 10.5 (*)    HCT 33.8 (*)    RDW 18.1 (*)    Platelets 411 (*)    Neutro Abs 12.1 (*)    Monocytes Absolute 1.9 (*)    Abs Immature Granulocytes 0.38 (*)    All other components within normal limits  BASIC METABOLIC PANEL - Abnormal; Notable for the following components:   Glucose, Bld 212 (*)    Creatinine, Ser 1.21 (*)    Calcium 8.3 (*)    GFR calc non Af Amer 40 (*)    GFR calc Af Amer 47 (*)    All other components within normal limits  LACTIC ACID, PLASMA - Abnormal; Notable for the following components:   Lactic Acid, Venous 3.0 (*)    All other components within normal limits  TROPONIN I - Abnormal; Notable for the following components:   Troponin I 0.13 (*)    All other components within normal limits  CULTURE, BLOOD (ROUTINE X 2)  CULTURE, BLOOD (ROUTINE X 2)  URINALYSIS, COMPLETE (UACMP) WITH MICROSCOPIC   ____________________________________________  EKG  ED ECG REPORT I, Rudene Re, the attending physician, personally viewed and interpreted this ECG.  Sinus tachycardia, rate of 112, normal intervals, normal axis, no ST elevations or depressions. ____________________________________________  RADIOLOGY  I have personally reviewed the images performed during this visit and I agree with the  Radiologist's read.   Interpretation by Radiologist:  No results found.    ____________________________________________   PROCEDURES  Procedure(s) performed: None Procedures Critical Care performed: yes  CRITICAL CARE Performed by: Rudene Re  ?  Total  critical care time: 40 min  Critical care time was exclusive of separately billable procedures and treating other patients.  Critical care was necessary to treat or prevent imminent or life-threatening deterioration.  Critical care was time spent personally by me on the following activities: development of treatment plan with patient and/or surrogate as well as nursing, discussions with consultants, evaluation of patient's response to treatment, examination of patient, obtaining history from patient or surrogate, ordering and performing treatments and interventions, ordering and review of laboratory studies, ordering and review of radiographic studies, pulse oximetry and re-evaluation of patient's condition.  ____________________________________________   INITIAL IMPRESSION / ASSESSMENT AND PLAN / ED COURSE   85 y.o. female with a history of sacral osteomyelitis currently with a PICC line receiving IV Rocephin and vancomycin, status post debridement 6 days ago, COPD, diabetes, hypertension, hyperlipidemia, anemia who presents for evaluation of tachycardia.  Here patient is in sinus tachycardia with rate in the 110s.  Looks slightly dry.  Areas of the decubitus ulcer looks well appearing considering how recent the debridement was, there is no pus, no crepitus, there is a small area of cellulitis surrounding it.  Patient is currently on Rocephin and vancomycin for it.  She has no chest pain or shortness of breath therefore low suspicion for pulmonary embolism especially since patient is receiving Lovenox shots. EKG sent from nursing home showing SVT with rate in the 150s.  Currently patient is not in SVT.  Will monitor closely on  telemetry.  Differential diagnosis for patient's tachycardia including dysrhythmias versus anemia versus DKA versus dehydration versus sepsis versus bacteremia.  Labs are pending.  Will give IV fluids.    _________________________ 5:21 PM on 10/22/2018 ----------------------------------------- Labs showing acute kidney injury with creatinine of 1.21 and a GFR of 40, patient's baseline GFR is greater than 60.  Patient also has WBC 15.9 with tachycardia and elevated lactic at 3.0 concerning for sepsis vs bacteremia,. Will send cultures from PICC line and peripheral.  Will broaden patient's antibiotics to Zosyn for possible Pseudomonas coverage.  Patient has already received vancomycin less than 8 hours ago so we will hold off at this time.  Troponin is also elevated at 0.13 with EKGs from nursing home showing SVT, most likely demand ischemia.  Patient denies any chest pain or shortness of breath.  At this time will give IV fluids as well and admit patient to the hospitalist service.    As part of my medical decision making, I reviewed the following data within the [place] notes reviewed and incorporated, Labs reviewed , EKG interpreted , Old EKG reviewed, Old chart reviewed, Radiograph reviewed , Discussed with admitting physician , Notes from prior ED visits and [place] Controlled Substance Database    Pertinent labs & imaging results that were available during my care of the patient were reviewed by me and considered in my medical decision making (see chart for details).    ____________________________________________   FINAL CLINICAL IMPRESSION(S) / ED DIAGNOSES  Final diagnoses:  Demand ischemia of myocardium ([place])  Dehydration  AKI (acute kidney injury) ([place])  Sepsis with acute renal failure without septic shock, due to unspecified organism, unspecified acute renal failure type ([place])      NEW MEDICATIONS STARTED DURING THIS VISIT:  ED Discharge Orders     None       Note:  This document was prepared using Dragon voice recognition software and may include unintentional dictation errors.    Alfred Levins, [place], MD 10/22/18  1723  

## 2018-10-22 NOTE — ED Notes (Addendum)
EKG completed. Pt arrived with heel protecting boots in place.

## 2018-10-22 NOTE — H&P (Signed)
[place] [facility] [place] NAME: Monica [name]    MR#:  [id number]  DATE OF BIRTH:  1930-03-12  DATE OF ADMISSION:  10/22/2018  PRIMARY CARE PHYSICIAN: [name], MD   REQUESTING/REFERRING PHYSICIAN: Dr Alfred Levins  CHIEF COMPLAINT:   Came in to the ED with tachycardia and diaphoresis from [facility] HISTORY OF PRESENT ILLNESS:  Monica [name]  is a 85 y.o. female with a known history of COPD, diabetes, hypertension, sacral osteomyelitis with stage IV sacral decubitus down to the bones status post extensive surgery done few days ago went to the facility two days ago with PICC line that was placed on 428 2020 for IV antibiotics comes in with diaphoresis and found to have heart rate in the 152 170s/SVT at the facility.  ER patient's heart rate was in the 100s when I saw her. She is afebrile. She is feeling very weak. Her lactic acid and white count is elevated.  She received broad-spectrum antibiotic with Zosyn. Vancomycin was given earlier at the facility.  Patient is being admitted with sepsis.  PAST MEDICAL HISTORY:   Past Medical History:  Diagnosis Date  . B12 deficiency   . COPD (chronic obstructive pulmonary disease) ([place])   . Diabetes mellitus   . H/O: Bell's palsy   . Hyperlipidemia   . Hypertension   . Lung mass April 2011   s/p biopsy, no cancer cells found, Repeat CT Dec 2012 unchanged  . Osteomyelitis ([place])     PAST SURGICAL HISTOIRY:   Past Surgical History:  Procedure Laterality Date  . ABDOMINAL HYSTERECTOMY    . APPLICATION OF WOUND VAC N/A 10/16/2018   Procedure: APPLICATION OF WOUND VAC;  Surgeon: [name], MD;  Location: [facility] ORS;  Service: General;  Laterality: N/A;  . CHOLECYSTECTOMY    . INCISION AND DRAINAGE OF WOUND N/A 10/16/2018   Procedure: IRRIGATION AND DEBRIDEMENT WOUND;  Surgeon: [name], MD;  Location: [facility] ORS;  Service: General;  Laterality: N/A;  . LUNG BIOPSY  2012    for positive PET, negative biopsy for malignancy  . SPINE SURGERY  2001   Lumbar    SOCIAL HISTORY:   Social History   Tobacco Use  . Smoking status: Former Smoker    Packs/day: 0.50    Years: 5.00    Pack years: 2.50    Types: Cigarettes  . Smokeless tobacco: Never Used  . Tobacco comment: quit approx 2016  Substance Use Topics  . Alcohol use: No    FAMILY HISTORY:   Family History  Problem Relation Age of Onset  . Cancer Mother 86       Breast  . Diabetes Mother   . Heart disease Father 35       Acute MI  . Heart disease Sister   . Arthritis Sister   . Heart disease Brother     DRUG ALLERGIES:  No Known Allergies  REVIEW OF SYSTEMS:  Review of Systems  Constitutional: Negative for chills, fever and weight loss.  HENT: Negative for ear discharge, ear pain and nosebleeds.   Eyes: Negative for blurred vision, pain and discharge.  Respiratory: Negative for sputum production, shortness of breath, wheezing and stridor.   Cardiovascular: Negative for chest pain, palpitations, orthopnea and PND.  Gastrointestinal: Negative for abdominal pain, diarrhea, nausea and vomiting.  Genitourinary: Negative for frequency and urgency.  Musculoskeletal: Positive for joint pain. Negative for back pain.  Neurological: Positive for weakness. Negative  for sensory change, speech change and focal weakness.  Psychiatric/Behavioral: Negative for depression and hallucinations. The patient is not nervous/anxious.      MEDICATIONS AT HOME:   Prior to Admission medications   Medication Sig Start Date End Date Taking? Authorizing Provider  cefTRIAXone (ROCEPHIN) IVPB Inject 2 g into the vein daily for 12 days. Doses due at 14:00 Indication:  Sacral decubitus and osteomyelitis Last Day of Therapy:  11/02/2018 Labs - Once weekly:  CBC/D and BMP, Labs - Every other week:  ESR and CRP 10/21/18 11/02/18 Yes Wieting, Richard, MD  collagenase (SANTYL) ointment Apply topically daily. 10/21/18  Yes  Wieting, Richard, MD  docusate sodium (COLACE) 100 MG capsule Take 1 capsule (100 mg total) by mouth 2 (two) times daily. 10/20/18  Yes Wieting, Richard, MD  enoxaparin (LOVENOX) 40 MG/0.4ML injection Inject 0.4 mLs (40 mg total) into the skin daily. 10/20/18  Yes Wieting, Richard, MD  lamoTRIgine (LAMICTAL) 25 MG tablet Take 2 tablets (50 mg total) by mouth at bedtime. 10/20/18  Yes Loletha Grayer, MD  metFORMIN (GLUCOPHAGE) 500 MG tablet Take 1 tablet (500 mg total) by mouth 2 (two) times daily with a meal. 10/20/18  Yes Wieting, Richard, MD  metoprolol succinate (TOPROL-XL) 50 MG 24 hr tablet Take 1 tablet (50 mg total) by mouth daily. Take with or immediately following a meal. 10/21/18  Yes Wieting, Richard, MD  multivitamin-lutein [facility]) CAPS capsule Take 1 capsule by mouth daily. 10/21/18  Yes Wieting, Richard, MD  oxyCODONE (OXY IR/ROXICODONE) 5 MG immediate release tablet Take 1 tablet (5 mg total) by mouth every 6 (six) hours as needed for moderate pain or severe pain. 10/20/18  Yes Wieting, Richard, MD  sertraline (ZOLOFT) 50 MG tablet Take 1 tablet (50 mg total) by mouth daily. 09/25/18  Yes [name], MD  sodium chloride 0.9 % infusion Inject 20 mLs into the vein as needed (for administration of IV medications (carrier fluid)). 10/20/18  Yes Wieting, Richard, MD  vancomycin IVPB Inject 1,000 mg into the vein daily for 13 days. Doses due at 18:00 will need vancomycin trough 4/30 Indication: sacral decubitus and osteomyelitis Last Day of Therapy:  11/02/2018 Labs - Sunday/Monday:  CBC/D, BMP, and vancomycin trough. Labs - Thursday:  BMP and vancomycin trough Labs - Every other week:  ESR and CRP 10/20/18 11/02/18 Yes Wieting, Richard, MD  acetaminophen (TYLENOL) 325 MG tablet Take 2 tablets (650 mg total) by mouth every 6 (six) hours as needed for mild pain (or Fever >/= 101). 10/20/18   Loletha Grayer, MD  Ensure Max Protein (ENSURE MAX PROTEIN) LIQD Take 330 mLs (11 oz total) by  mouth 2 (two) times daily between meals. 10/20/18   Loletha Grayer, MD      VITAL SIGNS:  Blood pressure 109/75, pulse (!) 118, temperature 98.5 F (36.9 C), temperature source Oral, resp. rate (!) 25, height 5' 7" (1.702 m), weight 73.9 kg, SpO2 100 %.  PHYSICAL EXAMINATION:  GENERAL:  85 y.o.-year-old patient lying in the bed with no acute distress. Chronically ill EYES: Pupils equal, round, reactive to light and accommodation. No scleral icterus. Extraocular muscles intact.  HEENT: Head atraumatic, normocephalic. Oropharynx and nasopharynx clear.  NECK:  Supple, no jugular venous distention. No thyroid enlargement, no tenderness.  LUNGS: Normal breath sounds bilaterally, no wheezing, rales,rhonchi or crepitation. No use of accessory muscles of respiration.  CARDIOVASCULAR: S1, S2 normal. No murmurs, rubs, or gallops. Mild tachycardia ABDOMEN: Soft, nontender, nondistended. Bowel sounds present. No organomegaly or  mass.  EXTREMITIES: bilateral heel protectors. Cannot do thorough skin exam NEUROLOGIC: Cranial nerves II through XII are intact. Muscle strength 5/5 in all extremities. Sensation intact. Gait not checked.  PSYCHIATRIC: The patient is alert and oriented x 3.  SKIN: patient has a sacral decubitus ulcer. According to ER physician she is dressing placed. No severe foul-smelling order noted. I have not examined her buttock skin  LABORATORY PANEL:   CBC Recent Labs  Lab 10/22/18 1625  WBC 15.9*  HGB 10.5*  HCT 33.8*  PLT 411*   ------------------------------------------------------------------------------------------------------------------  Chemistries  Recent Labs  Lab 10/19/18 0420  10/22/18 1625  NA 141  --  138  K 4.5  --  4.1  CL 114*  --  105  CO2 22  --  22  GLUCOSE 150*  --  212*  BUN 16  --  19  CREATININE 0.79   < > 1.21*  CALCIUM 7.9*  --  8.3*  MG 1.6*  --   --    < > = values in this interval not displayed.    ------------------------------------------------------------------------------------------------------------------  Cardiac Enzymes Recent Labs  Lab 10/22/18 1625  TROPONINI 0.13*   ------------------------------------------------------------------------------------------------------------------  RADIOLOGY:  Dg Chest Portable 1 View  Result Date: 10/22/2018 CLINICAL DATA:  Weakness, tachycardia EXAM: PORTABLE CHEST 1 VIEW COMPARISON:  10/12/2018 FINDINGS: Right-sided PICC line tip is at the cavoatrial junction. No focal airspace consolidation or pulmonary edema. Trace left pleural effusion. IMPRESSION: Right PICC line tip at the cavoatrial junction. Trace left pleural effusion. Electronically Signed   By: Ulyses Jarred M.D.   On: 10/22/2018 17:38    EKG:    IMPRESSION AND PLAN:   Monica [name]  is a 85 y.o. female with a known history of COPD, diabetes, hypertension, sacral osteomyelitis with stage IV sacral decubitus down to the bones status post extensive surgery done few days ago went to the facility two days ago with PICC line that was placed on 428 2020 for IV antibiotics comes in with diaphoresis and found to have heart rate in the 152 170s/SVT at the facility.  1. Sepsis suspected due to stage IV sacral decubitus/osteomyelitis with extensive recent debridement -IV vancomycin and Zosyn. Pharmacy to dose -infectious disease and surgical consultation. Informed Dr. Ola Spurr and Dr. Lysle Pearl -follow-up blood culture lactic acid white count  2. SVT/severe tachycardia -patient does not have cardiac history -heart rate is in the 100s -troponin .13 appears demand ischemia. Patient does not have chest pain -will cycle troponin times three check echo  3. COPD stable  4. Type II diabetes/hemoglobin A1c. Back control. Restart metformin at discharge  5. Depression continue Zoloft and Lamictal  6. Patient was COVID-19 test negative. Test was ordered because patient went out to  rehab.  7. DVT prophylaxis Lovenox  8. Bilateral heel decubitus local wound care  All the records are reviewed and case discussed with ED provider.   CODE STATUS: *FC  TOTAL TIME TAKING CARE OF THIS PATIENT: *50* minutes.    Fritzi Mandes M.D on 10/22/2018 at 6:17 PM  Between 7am to 6pm - Pager - [phone]  After 6pm go to [email or web address] - password [email or web address] [facility]  [facility]  Office  [phone]  CC: Primary care physician; [name], MD

## 2018-10-22 NOTE — ED Notes (Signed)
Blue/Grn/Lav tubes sent to lab.

## 2018-10-22 NOTE — ED Notes (Signed)
ED TO INPATIENT HANDOFF REPORT  ED Nurse Name and Phone #: Metta Clines [phone]  S Name/Age/Gender Ruby Cola 85 y.o. female Room/Bed: ED03A/ED03A  Code Status   Code Status: Prior  Home/SNF/Other [place] Patient oriented to: self, place, time and situation Is this baseline? Yes   Triage Complete: Triage complete  Chief Complaint Rapid Heart Rate  Triage Note Pt in via EMS from [facility]; PICC line in place to receive antibiotics for osteomyelitis; DM/COPD/UTI/Depression/Kidney dx/weakness history; EKG from facility HR 150-190 but dated in January (facility claims was taken today; EMS EKG at facility HR 105 ST).    Allergies No Known Allergies  Level of Care/Admitting Diagnosis ED Disposition    ED Disposition Condition [facility] Area: [place] [[id number]]  Level of Care: Telemetry [5]  Covid Evaluation: N/A  Diagnosis: Sepsis [facility]) [[id number]]  Admitting Physician: Odessa Fleming  Attending Physician: Odessa Fleming  Estimated length of stay: past midnight tomorrow  Certification:: I certify this patient will need inpatient services for at least 2 midnights  PT Class (Do Not Modify): Inpatient [101]  PT Acc Code (Do Not Modify): Private [1]       B Medical/Surgery History Past Medical History:  Diagnosis Date  . B12 deficiency   . COPD (chronic obstructive pulmonary disease) ([place])   . Diabetes mellitus   . H/O: Bell's palsy   . Hyperlipidemia   . Hypertension   . Lung mass April 2011   s/p biopsy, no cancer cells found, Repeat CT Dec 2012 unchanged  . Osteomyelitis [facility])    Past Surgical History:  Procedure Laterality Date  . ABDOMINAL HYSTERECTOMY    . APPLICATION OF WOUND VAC N/A 10/16/2018   Procedure: APPLICATION OF WOUND VAC;  Surgeon: [name], MD;  Location: [facility] ORS;  Service: General;  Laterality: N/A;  . CHOLECYSTECTOMY    . INCISION AND DRAINAGE OF WOUND N/A 10/16/2018    Procedure: IRRIGATION AND DEBRIDEMENT WOUND;  Surgeon: [name], MD;  Location: [facility] ORS;  Service: General;  Laterality: N/A;  . LUNG BIOPSY  2012   for positive PET, negative biopsy for malignancy  . SPINE SURGERY  2001   Lumbar     A IV Location/Drains/Wounds Patient Lines/Drains/Airways Status   Active Line/Drains/Airways    Name:   Placement date:   Placement time:   Site:   Days:   Peripheral IV 10/22/18 Left Antecubital   10/22/18    1607    Antecubital   less than 1   PICC Single Lumen 10/20/18 PICC Right Cephalic 36 cm 0 cm   03/50/09    3818    Cephalic   2   Negative Pressure Wound Therapy Sacrum Mid   10/16/18    0902    -   6   Incision (Closed) 10/16/18 Coccyx   10/16/18    0907     6   Pressure Injury 10/13/18 Unstageable - Full thickness tissue loss in which the base of the ulcer is covered by slough (yellow, tan, gray, green or brown) and/or eschar (tan, brown or black) in the wound bed. large area of eschar tissue   10/13/18    0445     9   Pressure Injury 10/13/18 Stage I -  Intact skin with non-blanchable redness of a localized area usually over a bony prominence. small area of blanchable redness   10/13/18    0445     9  Pressure Injury 10/13/18 Stage I -  Intact skin with non-blanchable redness of a localized area usually over a bony prominence. area of blanchable redness   10/13/18    0445     9          Intake/Output Last 24 hours No intake or output data in the 24 hours ending 10/22/18 1819  Labs/Imaging Results for orders placed or performed during the hospital encounter of 10/22/18 (from the past 48 hour(s))  CBC with Differential/Platelet     Status: Abnormal   Collection Time: 10/22/18  4:25 PM  Result Value Ref Range   WBC 15.9 (H) 4.0 - 10.5 K/uL   RBC 3.78 (L) 3.87 - 5.11 MIL/uL   Hemoglobin 10.5 (L) 12.0 - 15.0 g/dL   HCT 33.8 (L) 36.0 - 46.0 %   MCV 89.4 80.0 - 100.0 fL   MCH 27.8 26.0 - 34.0 pg   MCHC 31.1 30.0 - 36.0 g/dL   RDW  18.1 (H) 11.5 - 15.5 %   Platelets 411 (H) 150 - 400 K/uL   nRBC 0.0 0.0 - 0.2 %   Neutrophils Relative % 77 %   Neutro Abs 12.1 (H) 1.7 - 7.7 K/uL   Lymphocytes Relative 9 %   Lymphs Abs 1.4 0.7 - 4.0 K/uL   Monocytes Relative 12 %   Monocytes Absolute 1.9 (H) 0.1 - 1.0 K/uL   Eosinophils Relative 0 %   Eosinophils Absolute 0.0 0.0 - 0.5 K/uL   Basophils Relative 0 %   Basophils Absolute 0.1 0.0 - 0.1 K/uL   Immature Granulocytes 2 %   Abs Immature Granulocytes 0.38 (H) 0.00 - 0.07 K/uL    Comment: Performed at [facility], [address] [place], [place] [address]  Basic metabolic panel     Status: Abnormal   Collection Time: 10/22/18  4:25 PM  Result Value Ref Range   Sodium 138 135 - 145 mmol/L   Potassium 4.1 3.5 - 5.1 mmol/L   Chloride 105 98 - 111 mmol/L   CO2 22 22 - 32 mmol/L   Glucose, Bld 212 (H) 70 - 99 mg/dL   BUN 19 8 - 23 mg/dL   Creatinine, Ser 1.21 (H) 0.44 - 1.00 mg/dL   Calcium 8.3 (L) 8.9 - 10.3 mg/dL   GFR calc non Af Amer 40 (L) >60 mL/min   GFR calc Af Amer 47 (L) >60 mL/min   Anion gap 11 5 - 15    Comment: Performed at [facility], [address] [place], [place] [address]  Lactic acid, plasma     Status: Abnormal   Collection Time: 10/22/18  4:25 PM  Result Value Ref Range   Lactic Acid, Venous 3.0 (HH) 0.5 - 1.9 mmol/L    Comment: CRITICAL RESULT CALLED TO, READ BACK BY AND VERIFIED WITH GEORGIE Bao Coreas 10/22/18 @ 1717  [place] Performed at [facility], [address] [place], [place] [address]   Troponin I - ONCE - STAT     Status: Abnormal   Collection Time: 10/22/18  4:25 PM  Result Value Ref Range   Troponin I 0.13 (HH) <0.03 ng/mL    Comment: CRITICAL RESULT CALLED TO, READ BACK BY AND VERIFIED WITH GEORGIE Cynthie Garmon 10/22/18 @ 1712  [place] Performed at [facility], [address] [place], [place] [address]    Dg Chest Portable 1 View  Result Date: 10/22/2018 CLINICAL DATA:  Weakness, tachycardia EXAM:  PORTABLE CHEST 1 VIEW COMPARISON:  10/12/2018  FINDINGS: Right-sided PICC line tip is at the cavoatrial junction. No focal airspace consolidation or pulmonary edema. Trace left pleural effusion. IMPRESSION: Right PICC line tip at the cavoatrial junction. Trace left pleural effusion. Electronically Signed   By: Ulyses Jarred M.D.   On: 10/22/2018 17:38    Pending Labs Unresulted Labs (From admission, onward)    Start     Ordered   10/23/18 0500  Lactic acid, plasma  Tomorrow morning,   STAT     10/22/18 1810   10/22/18 1720  Blood culture (routine x 2)  BLOOD CULTURE X 2,   STAT     10/22/18 1720   10/22/18 1712  Urinalysis, Complete w Microscopic  ONCE - STAT,   STAT     10/22/18 1711   Signed and Held  Troponin I - Now Then Q6H  Now then every 6 hours,   R     Signed and Held          Vitals/Pain Today's Vitals   10/22/18 1617 10/22/18 1630 10/22/18 1645 10/22/18 1808  BP: 114/68 109/75  123/77  Pulse: (!) 109 (!) 118  (!) 103  Resp:  (!) 31 (!) 25 (!) 23  Temp:      TempSrc:      SpO2: 98% 100%  99%  Weight:      Height:      PainSc:        Isolation Precautions No active isolations  Medications Medications  piperacillin-tazobactam (ZOSYN) IVPB 3.375 g (3.375 g Intravenous New Bag/Given 10/22/18 1817)  insulin aspart (novoLOG) injection 0-9 Units (has no administration in time range)  sodium chloride 0.9 % bolus 1,000 mL (0 mLs Intravenous Stopped 10/22/18 1750)    Mobility Usually with device but states hasn't been up at facility in weeks. Moderate fall risk   Focused Assessments ST at 103; RA 98%; RR 20-28; Lactic 3; septic workup completed; afebrile; PICC on R arm; sacral wounds; L ac 20g; Zosyn running   R Recommendations: See Admitting Provider Note  Report given to:   Additional Notes:  Sacral dressing changed; R hand edematous; R wrist pulse 2+.

## 2018-10-22 NOTE — ED Notes (Addendum)
Pt requesting tylenol for knee pain. Pt repositioned in bed.

## 2018-10-22 NOTE — ED Notes (Addendum)
Pt given warm blankets. This RN assisted EDP [place] to roll pt and assess sacral wounds. Pt given tv remote but declined to turn it on. Rails up. Bed in lowest position. Pt denies any other needs. Call bell within reach.

## 2018-10-23 ENCOUNTER — Other Ambulatory Visit: Payer: Self-pay | Admitting: Internal Medicine

## 2018-10-23 ENCOUNTER — Inpatient Hospital Stay
Admit: 2018-10-23 | Discharge: 2018-10-23 | Disposition: A | Discharge status: Home / Self Care | Payer: Medicare Other | Attending: Internal Medicine | Admitting: Internal Medicine

## 2018-10-23 LAB — VANCOMYCIN, RANDOM: Vancomycin Rm: 18

## 2018-10-23 LAB — ECHOCARDIOGRAM COMPLETE
Height: 67 in
Weight: 2726.65 oz

## 2018-10-23 LAB — GLUCOSE, CAPILLARY
Glucose-Capillary: 145 mg/dL — ABNORMAL HIGH (ref 70–99)
Glucose-Capillary: 237 mg/dL — ABNORMAL HIGH (ref 70–99)
Glucose-Capillary: 153 mg/dL — ABNORMAL HIGH (ref 70–99)
Glucose-Capillary: 184 mg/dL — ABNORMAL HIGH (ref 70–99)

## 2018-10-23 LAB — LACTIC ACID, PLASMA: Lactic Acid, Venous: 0.9 mmol/L (ref 0.5–1.9)

## 2018-10-23 LAB — TROPONIN I
Troponin I: 0.21 ng/mL
Troponin I: 0.17 ng/mL

## 2018-10-23 LAB — PROCALCITONIN: Procalcitonin: 0.36 ng/mL

## 2018-10-23 MED ORDER — VANCOMYCIN HCL 10 G IV SOLR
1250.0000 mg | Freq: Once | INTRAVENOUS | Status: AC
  Administered 2018-10-24: 09:00:00 1250 mg via INTRAVENOUS
  Filled 2018-10-23: qty 1250

## 2018-10-23 NOTE — Progress Notes (Signed)
Pharmacy Antibiotic Note  Monica [name] is a 85 y.o. female admitted on 10/22/2018 with sepsis.  Pharmacy has been consulted for Vancomycin, Zosyn dosing. Patient was recently discharged to SNF on Vancomycin 1g IV q24h for treatment of osteomyelitis, this was to be continued through 5/11. Unclear when patient received last dose of Vancomycin at [facility]. Additionally, pt's SrCr has increased by 50% over past 3 days.  Random Vanc on 4/30 @ 1908 = 24 mcg/mL Random Vanc on 5/01 @ 0855 = 18 mcg/ml  Estimated Ke of 0.021 (based on above levels) Estimated t1/2 of 33 hr   Plan: Continue Zosyn 3.375g IV q8h  Recalculated Vancomycin using SCr 1.21 and empiric dosing calculator. Vancomycin 1250 mg IV Q 36 hrs. Goal AUC 400-550. Expected AUC: 477.8 SCr used: 1.21 Will order Vancomycin 1250mg  IV one time dose for 5/2 0800. Will need to follow up on AM SCr to determine further dosing.   Height: 5\' 7"  (170.2 cm) Weight: 170 lb 6.7 oz (77.3 kg) IBW/kg (Calculated) : 61.6  Temp (24hrs), Avg:98.1 F (36.7 C), Min:97.7 F (36.5 C), Max:98.5 F (36.9 C)  Recent Labs  Lab 10/17/18 0527 10/18/18 0435 10/19/18 0420 10/20/18 0619 10/22/18 1625 10/22/18 1908 10/23/18 0515 10/23/18 0855  WBC  --  7.5  --   --  15.9*  --   --   --   CREATININE 0.78 0.69 0.79 0.98 1.21*  --   --   --   LATICACIDVEN  --   --   --   --  3.0*  --  0.9  --   VANCORANDOM  --   --   --   --   --  24  --  18    Estimated Creatinine Clearance: 35.8 mL/min (A) (by C-G formula based on SCr of 1.21 mg/dL (H)).    No Known Allergies  Thank you for allowing pharmacy to be a part of this patient's care.  Paulina Fusi, PharmD, BCPS 10/23/2018 1:33 PM

## 2018-10-23 NOTE — Consult Note (Addendum)
[place] Nurse wound consult note Reason for Consult: sacral Stage 4 pressure injury Wound type:Pressure Pressure Injury POA: Yes Measurement:11cm x 9cm x 3cm with undermining from 11-1 o'clock measuring 2.5cm. There is a purple/maroon area of skin discoloration at 4 o'clock measuring 2.5cm x 3cm  Wound bed: 80% red, 20% yellow nonviable tissue Drainage (amount, consistency, odor) small amount of serous exudate Periwound:intact with a ring of erythema at periphery. See above for area of DPTI at 4 o'clock. Dressing procedure/placement/frequency: Wound cleansed with NS, and gently patted dry. Periwound is protected with strips of drape. 1 piece of black foam used to obliterate dead space and this is tucked into undermined area at apex of wound. Foam is secured with drape and an aperture is cut into the dressing to affix the T.R.A.C pad.  Dressing is attached to 166mmHg negative continuous pressure and an immediate seal is achieved.  It is noted that the distal margin of the wound is very close to the anus and stool may compromise seal.  Patient is to be turned side to side and avoid the supine position. I will provide a mattress replacement with low air loss feature to redistribute pressure. Patient tolerated procedure well.  [place] nursing team will follow, and will remain available to this patient, the nursing and medical teams.  Thanks, Maudie Flakes, MSN, RN, [place], Arther Abbott  Pager# [phone]

## 2018-10-23 NOTE — Progress Notes (Signed)
*  PRELIMINARY RESULTS* Echocardiogram 2D Echocardiogram has been performed.  [place] 10/23/2018, 8:03 AM

## 2018-10-23 NOTE — Progress Notes (Signed)
MD orders for negative pressure wound therapy implemented at this time with [place] nurse.

## 2018-10-23 NOTE — Progress Notes (Signed)
[facility] [place] NAME: Monica [name]    MR#:  [id number]  DATE OF BIRTH:  1929-09-03  SUBJECTIVE:   patient here with weakness and tachycardia from NH thought to be due to known sacral decub No fevers  Asking when she will be discharged.   REVIEW OF SYSTEMS:    Review of Systems  Constitutional: Negative for fever, chills weight loss HENT: Negative for ear pain, nosebleeds, congestion, facial swelling, rhinorrhea, neck pain, neck stiffness and ear discharge.   Respiratory: Negative for cough, shortness of breath, wheezing  Cardiovascular: Negative for chest pain, palpitations and leg swelling.  Gastrointestinal: Negative for heartburn, abdominal pain, vomiting, diarrhea or consitpation Genitourinary: Negative for dysuria, urgency, frequency, hematuria Musculoskeletal: Negative for back pain or joint pain Neurological: Negative for dizziness, seizures, syncope, focal weakness,  numbness and headaches.  Hematological: Does not bruise/bleed easily.  Psychiatric/Behavioral: Negative for hallucinations, confusion, dysphoric mood  SKIN sacral stage 4 decub, POA  Tolerating Diet: yes      DRUG ALLERGIES:  No Known Allergies  VITALS:  Blood pressure (!) 129/58, pulse 72, temperature 97.7 F (36.5 C), resp. rate 18, height 5\' 7"  (1.702 m), weight 77.3 kg, SpO2 99 %.  PHYSICAL EXAMINATION:  Constitutional: Appears well-developed and well-nourished. No distress. HENT: Normocephalic. Marland Kitchen Oropharynx is clear and moist.  Eyes: Conjunctivae and EOM are normal. PERRLA, no scleral icterus.  Neck: Normal ROM. Neck supple. No JVD. No tracheal deviation. CVS: RRR, S1/S2 +, no murmurs, no gallops, no carotid bruit.  Pulmonary: Effort and breath sounds normal, no stridor, rhonchi, wheezes, rales.  Abdominal: Soft. BS +,  no distension, tenderness, rebound or guarding.  Musculoskeletal: Normal range of motion. No edema and no tenderness.  Neuro:  Alert. CN 2-12 grossly intact. No focal deficits. Skin: Decubitus ulcer down to periosteum with clean base, no odor, minimal exduate, and no tunnelling along wound edge. Psychiatric: Normal mood and affect.   PICC line placed   LABORATORY PANEL:   CBC Recent Labs  Lab 10/22/18 1625  WBC 15.9*  HGB 10.5*  HCT 33.8*  PLT 411*   ------------------------------------------------------------------------------------------------------------------  Chemistries  Recent Labs  Lab 10/19/18 0420  10/22/18 1625  NA 141  --  138  K 4.5  --  4.1  CL 114*  --  105  CO2 22  --  22  GLUCOSE 150*  --  212*  BUN 16  --  19  CREATININE 0.79   < > 1.21*  CALCIUM 7.9*  --  8.3*  MG 1.6*  --   --    < > = values in this interval not displayed.   ------------------------------------------------------------------------------------------------------------------  Cardiac Enzymes Recent Labs  Lab 10/22/18 2137 10/23/18 0400 10/23/18 0855  TROPONINI 0.27* 0.21* 0.17*   ------------------------------------------------------------------------------------------------------------------  RADIOLOGY:  Dg Chest Portable 1 View  Result Date: 10/22/2018 CLINICAL DATA:  Weakness, tachycardia EXAM: PORTABLE CHEST 1 VIEW COMPARISON:  10/12/2018 FINDINGS: Right-sided PICC line tip is at the cavoatrial junction. No focal airspace consolidation or pulmonary edema. Trace left pleural effusion. IMPRESSION: Right PICC line tip at the cavoatrial junction. Trace left pleural effusion. Electronically Signed   By: Ulyses Jarred M.D.   On: 10/22/2018 17:38     ASSESSMENT AND PLAN:   85 year old female with a history of COPD and chronic sacral wound stage IV with recent extensive surgery who presented to the emergency room from [facility] care due to generalized weakness and tachycardia.   1.  SVT: Patient heart rates have improved. Continue telemetry  2.  Elevated troponin: This is due to demand  ischemia.  Patient ruled out for ACS.  Follow-up on echocardiogram.  3.  Stage IV sacral decubitus with extensive recent debridement: Sepsis has been ruled out.  Patient evaluated surgery who does not feel that the decubitus is infected. I would recommend to continue antibiotics for today and if blood cultures remain negative and okay with ID then discontinue antibiotics tomorrow. Follow-up in ID consultation. Continue vancomycin and Zosyn for now. 4.  COPD without signs of exacerbation Continue PRN nebs 5.  Diabetes: Continue sliding scale  6.  Essential hypertension: Continue metoprolol  7.  Depression: Continue Zoloft and Lamictal   Patient needs COVID-19 testing prior to going back to facility.  This has been ordered and results should be in the computer by tomorrow.   Management plans discussed with the patient and she is in agreement.  CODE STATUS: FULL  TOTAL TIME TAKING CARE OF THIS PATIENT: 30 minutes.     POSSIBLE D/C tomorrow, DEPENDING ON CLINICAL CONDITION.   Bettey Costa M.D on 10/23/2018 at 11:03 AM  Between 7am to 6pm - Pager - [phone] After 6pm go to [email or web address] - password [email or web address] [facility]  Office  [phone]  CC: Primary care physician; [name], MD  Note: This dictation was prepared with Dragon dictation along with smaller phrase technology. Any transcriptional errors that result from this process are unintentional.

## 2018-10-23 NOTE — Progress Notes (Addendum)
[place] Hospital Day(s): 1.   Post op day(s):  Marland Kitchen   Interval History: Patient seen and examined, no acute events or new complaints overnight. Patient reports that she is feeling much better this morning. No reports of fever or chills. She denies any pain in her sacral area. She had wet-to-dry dressing placed. She was discharged last week with wound vac to be changed MWF. The patient is unsure when this was removed.    Will ssume care from Dr Lysle Pearl as this patient is known to our service.   Review of Systems:  Constitutional: denies fever, chills  Integumentary: + Stage IV Sacral Decubitus   Vital signs in last 24 hours: [min-max] current  Temp:  [97.7 F (36.5 C)-98.5 F (36.9 C)] 97.7 F (36.5 C) (05/01 0800) Pulse Rate:  [72-118] 72 (05/01 0800) Resp:  [18-31] 18 (05/01 0800) BP: (109-154)/(56-77) 129/58 (05/01 0800) SpO2:  [97 %-100 %] 99 % (05/01 0800) Weight:  [73.9 kg-77.3 kg] 77.3 kg (05/01 0425)     Height: 5\' 7"  (170.2 cm) Weight: 77.3 kg BMI (Calculated): 26.68   Intake/Output this shift:  No intake/output data recorded.    Physical Exam:  Constitutional: alert, cooperative and no distress  Respiratory: breathing non-labored at rest  Cardiovascular: regular rate and sinus rhythm   Wound (10/23/2018):      Labs:  CBC Latest Ref Rng & Units 10/22/2018 10/18/2018 10/15/2018  WBC 4.0 - 10.5 K/uL 15.9(H) 7.5 9.3  Hemoglobin 12.0 - 15.0 g/dL 10.5(L) 9.6(L) 10.2(L)  Hematocrit 36.0 - 46.0 % 33.8(L) 30.1(L) 33.5(L)  Platelets 150 - 400 K/uL 411(H) 326 289   CMP Latest Ref Rng & Units 10/22/2018 10/20/2018 10/19/2018  Glucose 70 - 99 mg/dL 212(H) - 150(H)  BUN 8 - 23 mg/dL 19 - 16  Creatinine 0.44 - 1.00 mg/dL 1.21(H) 0.98 0.79  Sodium 135 - 145 mmol/L 138 - 141  Potassium 3.5 - 5.1 mmol/L 4.1 - 4.5  Chloride 98 - 111 mmol/L 105 - 114(H)  CO2 22 - 32 mmol/L 22 - 22  Calcium 8.9 - 10.3 mg/dL 8.3(L) - 7.9(L)  Total Protein  6.5 - 8.1 g/dL - - -  Total Bilirubin 0.3 - 1.2 mg/dL - - -  Alkaline Phos 38 - 126 U/L - - -  AST 15 - 41 U/L - - -  ALT 0 - 44 U/L - - -     Imaging studies: No new pertinent imaging studies   Assessment/Plan: (ICD-10's: L89.154) 85 y.o. female admitted to the Hospitalist with concern of sepsis. She does have a known stage IV sacral decubitus ulcer and underlying osteomyelitis 7 days s/p incision and debridement and wound vac placement, which does not appear to be source of patient's sepsis    - Okay for diet  - Pain control prn  - Continue IV Abx (Vancomycin, Zosyn)  - ID consult pending  - Recommend continuing wound vac - MWF  - no emergent surgical needs  - Further management per primary team    - General surgery will sign-off; please call with questions or if new issues arise  All of the above findings and recommendations were discussed with the patient, and the medical team, and all of patient's questions were answered to her expressed satisfaction.  -- Edison Simon, PA-C [facility] 10/23/2018, 9:17 AM [phone] M-F: 7am - 4pm

## 2018-10-23 NOTE — Plan of Care (Signed)
°  Problem: Elimination: °Goal: Will not experience complications related to bowel motility °Outcome: Progressing °  °Problem: Pain Managment: °Goal: General experience of comfort will improve °Outcome: Progressing °  °

## 2018-10-23 NOTE — Consult Note (Signed)
Infectious Disease     Reason for Consult: Sacral Osteomyelitis   Referring Physician: Karlton Lemon Date of Admission:  10/22/2018   Active Problems:   Sepsis ([place])   HPI: Monica [name] is a 85 y.o. female with hx of DM, HTN, spinal stenosis, COPD, FTT with increasing immobility and the development of a sacral decubitus ulcer. She had recent UTI and was treated with abx.   She is largely bedbound for the last several weeks and having increasing delirium.  Has recent admission for decub infection and had surgery and was dced on wound vac and IV vanco and ceftriaxone. Now readmitted with tachycardia due to SVT. No fevers on admi, wbc up to 15. Currently stable with HR improved. Denies pain or fevers, chills.    Past Medical History:  Diagnosis Date  . B12 deficiency   . COPD (chronic obstructive pulmonary disease) ([place])   . Diabetes mellitus   . H/O: Bell's palsy   . Hyperlipidemia   . Hypertension   . Lung mass April 2011   s/p biopsy, no cancer cells found, Repeat CT Dec 2012 unchanged  . Osteomyelitis [facility])    Past Surgical History:  Procedure Laterality Date  . ABDOMINAL HYSTERECTOMY    . APPLICATION OF WOUND VAC N/A 10/16/2018   Procedure: APPLICATION OF WOUND VAC;  Surgeon: [name], MD;  Location: [facility] ORS;  Service: General;  Laterality: N/A;  . CHOLECYSTECTOMY    . INCISION AND DRAINAGE OF WOUND N/A 10/16/2018   Procedure: IRRIGATION AND DEBRIDEMENT WOUND;  Surgeon: [name], MD;  Location: [facility] ORS;  Service: General;  Laterality: N/A;  . LUNG BIOPSY  2012   for positive PET, negative biopsy for malignancy  . SPINE SURGERY  2001   Lumbar   Social History   Tobacco Use  . Smoking status: Former Smoker    Packs/day: 0.50    Years: 5.00    Pack years: 2.50    Types: Cigarettes  . Smokeless tobacco: Never Used  . Tobacco comment: quit approx 2016  Substance Use Topics  . Alcohol use: No  . Drug use: No   Family History  Problem Relation Age of  Onset  . Cancer Mother 69       Breast  . Diabetes Mother   . Heart disease Father 54       Acute MI  . Heart disease Sister   . Arthritis Sister   . Heart disease Brother     Allergies: No Known Allergies  Current antibiotics: Antibiotics Given (last 72 hours)    Date/Time Action Medication Dose Rate   10/22/18 1817 New Bag/Given   piperacillin-tazobactam (ZOSYN) IVPB 3.375 g 3.375 g 100 mL/hr   10/23/18 0120 New Bag/Given   piperacillin-tazobactam (ZOSYN) IVPB 3.375 g 3.375 g 12.5 mL/hr   10/23/18 0848 New Bag/Given   piperacillin-tazobactam (ZOSYN) IVPB 3.375 g 3.375 g 12.5 mL/hr      MEDICATIONS: . docusate sodium  100 mg Oral BID  . enoxaparin  40 mg Subcutaneous Q24H  . insulin aspart  0-9 Units Subcutaneous TID WC  . lamoTRIgine  50 mg Oral QHS  . metoprolol succinate  50 mg Oral Daily  . multivitamin-lutein  1 capsule Oral Daily  . Ensure Max Protein  11 oz Oral BID BM  . sertraline  50 mg Oral Daily  . vancomycin variable dose per unstable renal function (pharmacist dosing)   Does not apply See admin instructions    Review of Systems -  unable to obtain due to dementia OBJECTIVE: Temp:  [97.7 F (36.5 C)-98.5 F (36.9 C)] 97.7 F (36.5 C) (05/01 0800) Pulse Rate:  [72-118] 72 (05/01 0800) Resp:  [18-31] 18 (05/01 0800) BP: (109-154)/(56-77) 129/58 (05/01 0800) SpO2:  [97 %-100 %] 99 % (05/01 0800) Weight:  [73.9 kg-77.3 kg] 77.3 kg (05/01 0425) Physical Exam  Constitutional:  oriented to person, place, but not to time.  She is pleasant and interactive and in no distress. HENT: [place]/AT, PERRLA, no scleral icterus Mouth/Throat: Oropharynx is clear and moist. No oropharyngeal exudate.  Cardiovascular: Normal rate, regular rhythm and normal heart sounds.  Pulmonary/Chest: Effort normal and breath sounds normal. No respiratory distress.  has no wheezes.  Neck = supple, no nuchal rigidity Abdominal: Soft. Bowel sounds are normal.  exhibits no distension. There is  no tenderness.  Lymphadenopathy: no cervical adenopathy. No axillary adenopathy Neurological: alert and oriented to person, place, but not time.  Able to move all 4 extremities. Skin: Her sacral wound is covered with the wound VAC.  However I reviewed the photo from today which shows a large wound with very clean base and no necrotic tissu Psychiatric: a normal mood and affect.  behavior is normal.    LABS: Results for orders placed or performed during the hospital encounter of 10/22/18 (from the past 48 hour(s))  CBC with Differential/Platelet     Status: Abnormal   Collection Time: 10/22/18  4:25 PM  Result Value Ref Range   WBC 15.9 (H) 4.0 - 10.5 K/uL   RBC 3.78 (L) 3.87 - 5.11 MIL/uL   Hemoglobin 10.5 (L) 12.0 - 15.0 g/dL   HCT 33.8 (L) 36.0 - 46.0 %   MCV 89.4 80.0 - 100.0 fL   MCH 27.8 26.0 - 34.0 pg   MCHC 31.1 30.0 - 36.0 g/dL   RDW 18.1 (H) 11.5 - 15.5 %   Platelets 411 (H) 150 - 400 K/uL   nRBC 0.0 0.0 - 0.2 %   Neutrophils Relative % 77 %   Neutro Abs 12.1 (H) 1.7 - 7.7 K/uL   Lymphocytes Relative 9 %   Lymphs Abs 1.4 0.7 - 4.0 K/uL   Monocytes Relative 12 %   Monocytes Absolute 1.9 (H) 0.1 - 1.0 K/uL   Eosinophils Relative 0 %   Eosinophils Absolute 0.0 0.0 - 0.5 K/uL   Basophils Relative 0 %   Basophils Absolute 0.1 0.0 - 0.1 K/uL   Immature Granulocytes 2 %   Abs Immature Granulocytes 0.38 (H) 0.00 - 0.07 K/uL    Comment: Performed at [facility], [address] [place],  [address]  Basic metabolic panel     Status: Abnormal   Collection Time: 10/22/18  4:25 PM  Result Value Ref Range   Sodium 138 135 - 145 mmol/L   Potassium 4.1 3.5 - 5.1 mmol/L   Chloride 105 98 - 111 mmol/L   CO2 22 22 - 32 mmol/L   Glucose, Bld 212 (H) 70 - 99 mg/dL   BUN 19 8 - 23 mg/dL   Creatinine, Ser 1.21 (H) 0.44 - 1.00 mg/dL   Calcium 8.3 (L) 8.9 - 10.3 mg/dL   GFR calc non Af Amer 40 (L) >60 mL/min   GFR calc Af Amer 47 (L) >60 mL/min   Anion gap 11 5 - 15     Comment: Performed at [facility], [address] [place], [place] [address]  Lactic acid, plasma     Status: Abnormal  Collection Time: 10/22/18  4:25 PM  Result Value Ref Range   Lactic Acid, Venous 3.0 (HH) 0.5 - 1.9 mmol/L    Comment: CRITICAL RESULT CALLED TO, READ BACK BY AND VERIFIED WITH GEORGIE HAHLE 10/22/18 @ 44  [place] Performed at [facility], [address] [place], [place] [address]   Troponin I - ONCE - STAT     Status: Abnormal   Collection Time: 10/22/18  4:25 PM  Result Value Ref Range   Troponin I 0.13 (HH) <0.03 ng/mL    Comment: CRITICAL RESULT CALLED TO, READ BACK BY AND VERIFIED WITH GEORGIE HAHLE 10/22/18 @ 1712  [place] Performed at [facility], [address] [place], [place] [address]   Blood culture (routine x 2)     Status: None (Preliminary result)   Collection Time: 10/22/18  5:58 PM  Result Value Ref Range   Specimen Description      BLOOD LEFT ANTECUBITAL Performed at [facility], [address] [place], [place] [address]    Special Requests      BOTTLES DRAWN AEROBIC AND ANAEROBIC Blood Culture results may not be optimal due to an excessive volume of blood received in culture bottles Performed at [facility], [address] [place], [place] [address]    Culture      NO GROWTH < 24 HOURS Performed at [facility], [address] [place], [place] [address]    Report Status PENDING   Blood culture (routine x 2)     Status: None (Preliminary result)   Collection Time: 10/22/18  5:58 PM  Result Value Ref Range   Specimen Description      BLOOD PORTA CATH Performed at [facility], [address] [place], [place] [address]    Special Requests      BOTTLES DRAWN AEROBIC AND ANAEROBIC Blood Culture results may not be optimal due to an excessive volume of blood received in culture bottles Performed at [facility], [address] [place], [place] [address]    Culture       NO GROWTH < 24 HOURS Performed at [facility], [address], [place], [place] [address]    Report Status PENDING   Vancomycin, random     Status: None   Collection Time: 10/22/18  7:08 PM  Result Value Ref Range   Vancomycin Rm 24     Comment:        Random Vancomycin therapeutic range is dependent on dosage and time of specimen collection. A peak range is 20.0-40.0 ug/mL A trough range is 5.0-15.0 ug/mL        Performed at [facility], [address] [place], [place] [address]   Glucose, capillary     Status: Abnormal   Collection Time: 10/22/18  8:38 PM  Result Value Ref Range   Glucose-Capillary 201 (H) 70 - 99 mg/dL  Troponin I - Now Then Q6H     Status: Abnormal   Collection Time: 10/22/18  9:37 PM  Result Value Ref Range   Troponin I 0.27 (HH) <0.03 ng/mL    Comment: CRITICAL VALUE NOTED. VALUE IS CONSISTENT WITH PREVIOUSLY REPORTED/CALLED VALUE SMA Performed at [facility], [address] [place], [place] [address]   Troponin I - Now Then Q6H     Status: Abnormal   Collection Time: 10/23/18  4:00 AM  Result Value Ref Range   Troponin I 0.21 (HH) <0.03 ng/mL    Comment: CRITICAL VALUE NOTED. VALUE  IS CONSISTENT WITH PREVIOUSLY REPORTED/CALLED VALUE SMA Performed at [facility], [address] [place], [place] [address]   Lactic acid, plasma     Status: None   Collection Time: 10/23/18  5:15 AM  Result Value Ref Range   Lactic Acid, Venous 0.9 0.5 - 1.9 mmol/L    Comment: Performed at [facility], [address] [place], [place] [address]  Glucose, capillary     Status: Abnormal   Collection Time: 10/23/18  7:57 AM  Result Value Ref Range   Glucose-Capillary 145 (H) 70 - 99 mg/dL  Vancomycin, random     Status: None   Collection Time: 10/23/18  8:55 AM  Result Value Ref Range   Vancomycin Rm 18     Comment:        Random Vancomycin therapeutic range is dependent on dosage and time of specimen collection. A  peak range is 20.0-40.0 ug/mL A trough range is 5.0-15.0 ug/mL        Performed at [facility], [place], [place] [address]   Troponin I - Now Then Q6H     Status: Abnormal   Collection Time: 10/23/18  8:55 AM  Result Value Ref Range   Troponin I 0.17 (HH) <0.03 ng/mL    Comment: CRITICAL VALUE NOTED. VALUE IS CONSISTENT WITH PREVIOUSLY REPORTED/CALLED VALUE DAS Performed at [facility], [address] [place], [place] [address]   Procalcitonin - Baseline     Status: None   Collection Time: 10/23/18  8:55 AM  Result Value Ref Range   Procalcitonin 0.36 ng/mL    Comment:        Interpretation: PCT (Procalcitonin) <= 0.5 ng/mL: Systemic infection (sepsis) is not likely. Local bacterial infection is possible. (NOTE)       Sepsis PCT Algorithm           Lower Respiratory Tract                                      Infection PCT Algorithm    ----------------------------     ----------------------------         PCT < 0.25 ng/mL                PCT < 0.10 ng/mL         Strongly encourage             Strongly discourage   discontinuation of antibiotics    initiation of antibiotics    ----------------------------     -----------------------------       PCT 0.25 - 0.50 ng/mL            PCT 0.10 - 0.25 ng/mL               OR       >80% decrease in PCT            Discourage initiation of                                            antibiotics      Encourage discontinuation           of antibiotics    ----------------------------     -----------------------------         PCT >= 0.50 ng/mL  PCT 0.26 - 0.50 ng/mL               AND        <80% decrease in PCT             Encourage initiation of                                             antibiotics       Encourage continuation           of antibiotics    ----------------------------     -----------------------------        PCT >= 0.50 ng/mL                  PCT > 0.50 ng/mL                AND         increase in PCT                  Strongly encourage                                      initiation of antibiotics    Strongly encourage escalation           of antibiotics                                     -----------------------------                                           PCT <= 0.25 ng/mL                                                 OR                                        > 80% decrease in PCT                                     Discontinue / Do not initiate                                             antibiotics Performed at [facility], [address] [place], [place] [address]   Glucose, capillary     Status: Abnormal   Collection Time: 10/23/18 11:35 AM  Result Value Ref Range   Glucose-Capillary 237 (H) 70 - 99 mg/dL   No components found for: ESR, C REACTIVE PROTEIN MICRO: Recent Results (from the past 720 hour(s))  Urine Culture     Status: Abnormal   Collection Time: 10/12/18  6:10 PM  Result Value Ref  Range Status   Specimen Description   Final    URINE, RANDOM Performed at [facility], [address] [place], [place] [address]    Special Requests   Final    NONE Performed at [facility], [address] [place], [place] [address]    Culture >=100,000 COLONIES/mL VIRIDANS STREPTOCOCCUS (A)  Final   Report Status 10/13/2018 FINAL  Final  Aerobic/Anaerobic Culture (surgical/deep wound)     Status: None   Collection Time: 10/16/18  8:58 AM  Result Value Ref Range Status   Specimen Description   Final    ULCER SACRAL Performed at [facility], [place] [address], [place], [place] [address]    Special Requests   Final    NONE Performed at [facility], [place], [place] [address]    Gram Stain NO WBC SEEN NO ORGANISMS SEEN   Final   Culture   Final    FEW CANDIDA ALBICANS NO ANAEROBES ISOLATED Performed at [facility], [place] [address] [place], [place] [address]     Report Status 10/21/2018 FINAL  Final  Novel Coronavirus, NAA (hospital order; send-out to ref lab)     Status: None   Collection Time: 10/17/18  3:46 PM  Result Value Ref Range Status   SARS-CoV-2, NAA NOT DETECTED NOT DETECTED Final    Comment: (NOTE) This test was developed and its performance characteristics determined by [employer]. This test has not been FDA cleared or approved. This test has been authorized by FDA under an Emergency Use Authorization (EUA). This test has been validated in accordance with the FDA's Guidance Document (Policy for [place] in Laboratories Certified to Perform High Complexity Testing under CLIA prior to Emergency Use Authorization for Coronavirus [id number] during the [facility] Emergency) issued on February 29th, 2020. FDA independent review of this validation is pending. This test is only authorized for the duration of time the declaration that circumstances exist justifying the authorization of the emergency use of in vitro diagnostic tests for detection of SARS-CoV- 2 virus and/or diagnosis of COVID-19 infection under section 564(b)(1) of the Act, 21 U.S.C. 562BWL-8(L)(3), unless the authorization is terminated or revoked sooner. Performed At: [facility] [address] [place], [place] [id number] Rush Farmer MD [id number]    [place]  Final    Comment: Performed at [facility], [address] [place], [place] [address]  Blood culture (routine x 2)     Status: None (Preliminary result)   Collection Time: 10/22/18  5:58 PM  Result Value Ref Range Status   Specimen Description   Final    BLOOD LEFT ANTECUBITAL Performed at [facility], [address] [place], [place] [address]    Special Requests   Final    BOTTLES DRAWN AEROBIC AND ANAEROBIC Blood Culture results may not be optimal due to an excessive volume of blood received in culture  bottles Performed at [facility], [address] [place], [place] [address]    Culture   Final    NO GROWTH < 24 HOURS Performed at [facility], [address], [place],  [address]    Report Status PENDING  Incomplete  Blood culture (routine x 2)     Status: None (Preliminary result)   Collection Time: 10/22/18  5:58 PM  Result Value Ref Range Status   Specimen Description   Final    BLOOD PORTA CATH Performed at [facility],  [place], [place] [address]    Special Requests   Final    BOTTLES DRAWN AEROBIC AND ANAEROBIC Blood Culture results may not be optimal due to an excessive volume of blood received in culture bottles Performed at [facility], [address] [place], [place] [address]    Culture   Final    NO GROWTH < 24 HOURS Performed at [facility], [address], [place], [place] [address]    Report Status PENDING  Incomplete    IMAGING: Dg Abd 1 View  Result Date: 10/14/2018 CLINICAL DATA:  Vomiting. EXAM: ABDOMEN - 1 VIEW COMPARISON:  None. FINDINGS: The bowel gas pattern is normal. No radio-opaque calculi or other significant radiographic abnormality are seen. Convex right lumbar scoliosis and multilevel degenerative change noted. IMPRESSION: No acute abnormality. Electronically Signed   By: Inge Rise M.D.   On: 10/14/2018 14:12   Ct Head Wo Contrast  Result Date: 10/12/2018 CLINICAL DATA:  Muscle weakness. EXAM: CT HEAD WITHOUT CONTRAST TECHNIQUE: Contiguous axial images were obtained from the base of the skull through the vertex without intravenous contrast. COMPARISON:  CT scan of February 25, 2012. FINDINGS: Brain: Mild diffuse cortical atrophy is noted. Mild chronic ischemic white matter disease is noted. No mass effect or midline shift is noted. Ventricular size is within normal limits. There is no evidence of mass lesion, hemorrhage or acute infarction. Vascular: No hyperdense vessel or unexpected  calcification. Skull: Normal. Negative for fracture or focal lesion. Sinuses/Orbits: No acute finding. Other: None. IMPRESSION: Mild diffuse cortical atrophy. Mild chronic ischemic white matter disease. No acute intracranial abnormality seen. Electronically Signed   By: Marijo Conception M.D.   On: 10/12/2018 18:29   Mr Pelvis W Wo Contrast  Result Date: 10/19/2018 CLINICAL DATA:  Sacral decubitus ulcer. EXAM: MRI PELVIS WITHOUT AND WITH CONTRAST TECHNIQUE: Multiplanar multisequence MR imaging of the pelvis was performed both before and after administration of intravenous contrast. CONTRAST:  8 mm Gadavist intravenous contrast. COMPARISON:  MRI left hip dated July 20, 2018. FINDINGS: Urinary Tract:  No abnormality visualized. Bowel:  Unremarkable visualized pelvic bowel loops. Vascular/Lymphatic: No pathologically enlarged lymph nodes. No significant vascular abnormality seen. Reproductive:  Prior hysterectomy.  No adnexal mass. Other:  Presacral edema.  No abscess. Musculoskeletal: Large sacral decubitus ulcer eccentric to the left with wound VAC in place. The wound extends to the sacrococcygeal junction. There is mild marrow edema in the dorsal aspect of the proximal coccyx with enhancement and indistinctness of the dorsal cortex. No acute fracture or dislocation. No joint effusion. Trace fluid in the left greater than right greater trochanteric bursae. Unchanged mild bilateral hip osteoarthritis. Mild-to-moderate diffuse muscle atrophy. IMPRESSION: 1. Large sacral decubitus ulcer extending to the sacrococcygeal junction with early osteomyelitis of the proximal coccyx. No abscess. Electronically Signed   By: Titus Dubin M.D.   On: 10/19/2018 07:59   Dg Chest Portable 1 View  Result Date: 10/22/2018 CLINICAL DATA:  Weakness, tachycardia EXAM: PORTABLE CHEST 1 VIEW COMPARISON:  10/12/2018 FINDINGS: Right-sided PICC line tip is at the cavoatrial junction. No focal airspace consolidation or pulmonary  edema. Trace left pleural effusion. IMPRESSION: Right PICC line tip at the cavoatrial junction. Trace left pleural effusion. Electronically Signed   By: Ulyses Jarred M.D.   On: 10/22/2018 17:38   Dg Chest Portable 1 View  Result Date: 10/12/2018 CLINICAL DATA:  Weakness. EXAM: PORTABLE CHEST 1 VIEW COMPARISON:  Radiographs of March 22, 2012. FINDINGS: The heart  size and mediastinal contours are within normal limits. Both lungs are clear. No pneumothorax or pleural effusion is noted. The visualized skeletal structures are unremarkable. IMPRESSION: No active disease. Electronically Signed   By: Marijo Conception M.D.   On: 10/12/2018 17:48   [place] Ekg Site Rite  Result Date: 10/20/2018 If Site Rite image not attached, placement could not be confirmed due to current cardiac rhythm.  [place] Ekg Site Rite  Result Date: 10/19/2018 If Site Rite image not attached, placement could not be confirmed due to current cardiac rhythm.   Assessment:   Monica [name] is a 85 y.o. female with a history of failure to thrive, chronically largely bedbound, diabetes, hypertension, spinal stenosis admitted with sacral decubitus ulcer with necrotic tissue.  She was admitted recently and had surgical debridement and wound vac placement. She has MRI findign of early osteomyelitis of the proximal coccyx.  There was no abscess.  Cultures were  negative from the wound.  She has been seen by palliative care and family seems aware of the limited option for improvement but wishes to continue treatment at this point. She was dced to SNF with IV vanco and ceftriaxone but returned due to tachycardia and svt.   On admit no fever, wbc 15.  He wound has been evaluated by surgery and it appears relatively clean without active infection  Today is day 12 total of IV abx.  Recommendations She has basically completed a 2 week course of IV abx for the coccyx early osteomyelitis.  At dc can convert to oral doxy 100 bid and cipro 500 bid for  another 2 weeks.. Continue wound care per surgery.   Thank you very much for allowing me to participate in the care of this patient. Please call with questions.   Cheral Marker. Ola Spurr, MD

## 2018-10-24 LAB — IRON AND TIBC
Iron: 27 ug/dL — ABNORMAL LOW (ref 28–170)
Saturation Ratios: 19 % (ref 10.4–31.8)
TIBC: 139 ug/dL — ABNORMAL LOW (ref 250–450)
UIBC: 112 ug/dL

## 2018-10-24 LAB — BASIC METABOLIC PANEL
Anion gap: 7 (ref 5–15)
BUN: 23 mg/dL (ref 8–23)
CO2: 24 mmol/L (ref 22–32)
Calcium: 8.1 mg/dL — ABNORMAL LOW (ref 8.9–10.3)
Chloride: 108 mmol/L (ref 98–111)
Creatinine, Ser: 1.1 mg/dL — ABNORMAL HIGH (ref 0.44–1.00)
GFR calc Af Amer: 53 mL/min — ABNORMAL LOW (ref >60)
GFR calc non Af Amer: 45 mL/min — ABNORMAL LOW (ref >60)
Glucose, Bld: 143 mg/dL — ABNORMAL HIGH (ref 70–99)
Potassium: 3.6 mmol/L (ref 3.5–5.1)
Sodium: 139 mmol/L (ref 135–145)

## 2018-10-24 LAB — GLUCOSE, CAPILLARY
Glucose-Capillary: 141 mg/dL — ABNORMAL HIGH (ref 70–99)
Glucose-Capillary: 195 mg/dL — ABNORMAL HIGH (ref 70–99)
Glucose-Capillary: 250 mg/dL — ABNORMAL HIGH (ref 70–99)
Glucose-Capillary: 182 mg/dL — ABNORMAL HIGH (ref 70–99)

## 2018-10-24 LAB — FERRITIN: Ferritin: 674 ng/mL — ABNORMAL HIGH (ref 11–307)

## 2018-10-24 LAB — CBC
HCT: 26.7 % — ABNORMAL LOW (ref 36.0–46.0)
Hemoglobin: 8.4 g/dL — ABNORMAL LOW (ref 12.0–15.0)
MCH: 28 pg (ref 26.0–34.0)
MCHC: 31.5 g/dL (ref 30.0–36.0)
MCV: 89 fL (ref 80.0–100.0)
Platelets: 322 10*3/uL (ref 150–400)
RBC: 3 MIL/uL — ABNORMAL LOW (ref 3.87–5.11)
RDW: 17.5 % — ABNORMAL HIGH (ref 11.5–15.5)
WBC: 8.5 10*3/uL (ref 4.0–10.5)
nRBC: 0 % (ref 0.0–0.2)

## 2018-10-24 LAB — RETICULOCYTES
Immature Retic Fract: 21.9 % — ABNORMAL HIGH (ref 2.3–15.9)
RBC.: 3.2 MIL/uL — ABNORMAL LOW (ref 3.87–5.11)
Retic Count, Absolute: 90.2 10*3/uL (ref 19.0–186.0)
Retic Ct Pct: 2.8 % (ref 0.4–3.1)

## 2018-10-24 LAB — PROCALCITONIN: Procalcitonin: 0.25 ng/mL

## 2018-10-24 LAB — VITAMIN B12: Vitamin B-12: 405 pg/mL (ref 180–914)

## 2018-10-24 LAB — NOVEL CORONAVIRUS, NAA (HOSP ORDER, SEND-OUT TO REF LAB; TAT 18-24 HRS): SARS-CoV-2, NAA: NOT DETECTED

## 2018-10-24 LAB — FOLATE: Folate: 10.1 ng/mL (ref >5.9)

## 2018-10-24 MED ORDER — SODIUM CHLORIDE 0.9% FLUSH
10.0000 mL | Freq: Two times a day (BID) | INTRAVENOUS | Status: DC
  Administered 2018-10-24 – 2018-10-26 (×4): 10 mL via INTRAVENOUS

## 2018-10-24 MED ORDER — SODIUM CHLORIDE 0.9 % IV SOLN
INTRAVENOUS | Status: DC | PRN
  Administered 2018-10-24 – 2018-10-25 (×3): 500 mL via INTRAVENOUS

## 2018-10-24 NOTE — NC FL2 (Signed)
[place] LEVEL OF CARE SCREENING TOOL     IDENTIFICATION  Patient Name: Monica [name] Birthdate: 1930/04/08 Sex: female Admission Date (Current Location): 10/22/2018  [place] and [place] Number:  [name] [id number] [place] and Address:  [facility], [address], [place], [place] [address]      Provider Number: [id number]  Attending Physician Name and Address:  Gladstone Lighter, MD  Relative Name and Phone Number:  Silvio Pate, grand daughter [phone]    Current Level of Care: Hospital Recommended Level of Care: [place] Prior Approval Number:    Date Approved/Denied:   PASRR Number: [id number]  Discharge Plan: SNF    Current Diagnoses: Patient Active Problem List   Diagnosis Date Noted  . Sepsis ([place]) 10/22/2018  . Stage IV pressure ulcer of sacral region ([place])   . Weakness 10/13/2018  . Pressure injury of skin 10/13/2018  . Spinal stenosis of lumbar region 10/06/2018  . Fecal incontinence 10/06/2018  . Type 2 diabetes mellitus with diabetic nephropathy, with long-term current use of insulin ([place]) 09/27/2018  . CKD stage 3 due to type 2 diabetes mellitus ([place]) 09/27/2018  . Delirium due to another medical condition, acute, hyperactive 09/27/2018  . Impaired ambulation 09/27/2018  . Recurrent falls 06/11/2017  . Dystrophic nail 11/13/2016  . Allergic rhinitis 09/30/2015  . Major depressive disorder, recurrent episode with anxious distress ([place]) 10/10/2013  . Vitamin D deficiency 10/08/2013  . Bilateral chronic knee pain 09/21/2011  . Tobacco abuse counseling 07/08/2011  . Incontinence of urine 06/13/2011  . History of tobacco abuse 06/11/2011  . Hyperlipidemia   . Hypertension   . COPD (chronic obstructive pulmonary disease) ([place])   . B12 deficiency   . Lung mass     Orientation RESPIRATION BLADDER Height & Weight     Self, Situation, Place  Normal Incontinent Weight: 166 lb 14.2 oz (75.7  kg)([name]) Height:  5\' 7"  (170.2 cm)  BEHAVIORAL SYMPTOMS/MOOD NEUROLOGICAL BOWEL NUTRITION STATUS      Incontinent Diet(Regular diet)  AMBULATORY STATUS COMMUNICATION OF NEEDS Skin   Extensive Assist Verbally PU Stage and Appropriate Care, Surgical wounds, Wound Vac PU Stage 1 Dressing: (Every 3 days)                     Personal Care Assistance Level of Assistance  Bathing, Feeding, Dressing Bathing Assistance: Limited assistance Feeding assistance: Limited assistance Dressing Assistance: Limited assistance Total Care Assistance: Limited assistance   Functional Limitations Info  Sight, Hearing, Speech Sight Info: Adequate Hearing Info: Adequate Speech Info: Adequate    SPECIAL CARE FACTORS FREQUENCY  PT (By licensed PT), OT (By licensed OT)     PT Frequency: Min 5x a week OT Frequency: min 5x a week            Contractures Contractures Info: Not present    Additional Factors Info  Code Status, Allergies, Insulin Sliding Scale, Psychotropic Code Status Info: Full Code Allergies Info: NKA Psychotropic Info: sertraline (ZOLOFT) tablet 50 mg  Insulin Sliding Scale Info: insulin aspart (novoLOG) injection 0-9 Units 3x a day with meals       Current Medications (10/24/2018):  This is the current hospital active medication list Current Facility-Administered Medications  Medication Dose Route Frequency Provider Last Rate Last Dose  . 0.9 %  sodium chloride infusion   Intravenous PRN Gladstone Lighter, MD 10 mL/hr at 10/24/18 0849 500 mL at 10/24/18 0849  . acetaminophen (TYLENOL) tablet 650 mg  650 mg Oral  Q6H PRN Fritzi Mandes, MD   650 mg at 10/23/18 1002   Or  . acetaminophen (TYLENOL) suppository 650 mg  650 mg Rectal Q6H PRN Fritzi Mandes, MD      . albuterol (PROVENTIL) (2.5 MG/3ML) 0.083% nebulizer solution 2.5 mg  2.5 mg Nebulization Q2H PRN Fritzi Mandes, MD      . docusate sodium (COLACE) capsule 100 mg  100 mg Oral BID Fritzi Mandes, MD   100 mg at 10/24/18 0824   . enoxaparin (LOVENOX) injection 40 mg  40 mg Subcutaneous Q24H Fritzi Mandes, MD   40 mg at 10/24/18 0845  . insulin aspart (novoLOG) injection 0-9 Units  0-9 Units Subcutaneous TID WC Fritzi Mandes, MD   2 Units at 10/24/18 1212  . lamoTRIgine (LAMICTAL) tablet 50 mg  50 mg Oral QHS Fritzi Mandes, MD   50 mg at 10/23/18 2156  . metoprolol succinate (TOPROL-XL) 24 hr tablet 50 mg  50 mg Oral Daily Fritzi Mandes, MD   50 mg at 10/24/18 0824  . multivitamin-lutein (OCUVITE-LUTEIN) capsule 1 capsule  1 capsule Oral Daily Fritzi Mandes, MD   1 capsule at 10/24/18 [phone]  . ondansetron (ZOFRAN) tablet 4 mg  4 mg Oral Q6H PRN Fritzi Mandes, MD       Or  . ondansetron [facility]) injection 4 mg  4 mg Intravenous Q6H PRN Fritzi Mandes, MD      . oxyCODONE (Oxy IR/ROXICODONE) immediate release tablet 5 mg  5 mg Oral Q6H PRN Fritzi Mandes, MD   5 mg at 10/24/18 1210  . piperacillin-tazobactam (ZOSYN) IVPB 3.375 g  3.375 g Intravenous [name], MD 12.5 mL/hr at 10/24/18 0851 3.375 g at 10/24/18 0851  . protein supplement (ENSURE MAX) liquid  11 oz Oral BID BM Fritzi Mandes, MD   11 oz at 10/24/18 1029  . sertraline (ZOLOFT) tablet 50 mg  50 mg Oral Daily Fritzi Mandes, MD   50 mg at 10/24/18 0824  . vancomycin variable dose per unstable renal function (pharmacist dosing)   Does not apply See admin instructions Pernell Dupre, [facility]         Discharge Medications: Please see discharge summary for a list of discharge medications.  Relevant Imaging Results:  Relevant Lab Results:   Additional Information SSN [id number]  Ross Ludwig, LCSW

## 2018-10-24 NOTE — Progress Notes (Signed)
Turned her to her back at her request because her knees are hurting her.  Will not leave her on her back for very long.  Low air loss bed ordered.

## 2018-10-24 NOTE — Progress Notes (Signed)
[place] INFECTIOUS DISEASE PROGRESS NOTE Date of Admission:  10/22/2018     ID: Monica [name] is a 85 y.o. female with sacral decub, sepsis Active Problems:   Sepsis ([place])   Subjective: No fevers, HR stable  ROS  Eleven systems are reviewed and negative except per hpi  Medications:  Antibiotics Given (last 72 hours)    Date/Time Action Medication Dose Rate   10/22/18 1817 New Bag/Given   piperacillin-tazobactam (ZOSYN) IVPB 3.375 g 3.375 g 100 mL/hr   10/23/18 0120 New Bag/Given   piperacillin-tazobactam (ZOSYN) IVPB 3.375 g 3.375 g 12.5 mL/hr   10/23/18 0848 New Bag/Given   piperacillin-tazobactam (ZOSYN) IVPB 3.375 g 3.375 g 12.5 mL/hr   10/23/18 1721 New Bag/Given   piperacillin-tazobactam (ZOSYN) IVPB 3.375 g 3.375 g 12.5 mL/hr   10/24/18 0020 New Bag/Given   piperacillin-tazobactam (ZOSYN) IVPB 3.375 g 3.375 g 12.5 mL/hr   10/24/18 0851 New Bag/Given   piperacillin-tazobactam (ZOSYN) IVPB 3.375 g 3.375 g 12.5 mL/hr   10/24/18 0854 New Bag/Given   vancomycin (VANCOCIN) 1,250 mg in sodium chloride 0.9 % 250 mL IVPB 1,250 mg 166.7 mL/hr     . docusate sodium  100 mg Oral BID  . enoxaparin  40 mg Subcutaneous Q24H  . insulin aspart  0-9 Units Subcutaneous TID WC  . lamoTRIgine  50 mg Oral QHS  . metoprolol succinate  50 mg Oral Daily  . multivitamin-lutein  1 capsule Oral Daily  . Ensure Max Protein  11 oz Oral BID BM  . sertraline  50 mg Oral Daily  . vancomycin variable dose per unstable renal function (pharmacist dosing)   Does not apply See admin instructions    Objective: Vital signs in last 24 hours: Temp:  [98 F (36.7 C)-98.4 F (36.9 C)] 98.3 F (36.8 C) (05/02 0740) Pulse Rate:  [79-96] 88 (05/02 0740) Resp:  [18-20] 20 (05/02 0740) BP: (120-138)/(51-71) 138/71 (05/02 0740) SpO2:  [98 %-99 %] 98 % (05/02 0740) Weight:  [75.7 kg] 75.7 kg (05/02 0419) Constitutional:  oriented to person, place, but not to time.  She is pleasant and interactive and  in no distress. HENT: [place]/AT, PERRLA, no scleral icterus Mouth/Throat: Oropharynx is clear and moist. No oropharyngeal exudate.  Cardiovascular: Normal rate, regular rhythm and normal heart sounds.  Pulmonary/Chest: Effort normal and breath sounds normal. No respiratory distress.  has no wheezes.  Neck = supple, no nuchal rigidity Abdominal: Soft. Bowel sounds are normal.  exhibits no distension. There is no tenderness.  Lymphadenopathy: no cervical adenopathy. No axillary adenopathy Neurological: alert and oriented to person, place, but not time.  Able to move all 4 extremities. Skin: Her sacral wound is covered with the wound VAC.  However I reviewed the photo from today which shows a large wound with very clean base and no necrotic tissu Psychiatric: a normal mood and affect.  behavior is normal.    Lab Results Recent Labs    10/22/18 1625 10/24/18 0442  WBC 15.9* 8.5  HGB 10.5* 8.4*  HCT 33.8* 26.7*  NA 138 139  K 4.1 3.6  CL 105 108  CO2 22 24  BUN 19 23  CREATININE 1.21* 1.10*    Microbiology: Results for orders placed or performed during the hospital encounter of 10/22/18  Blood culture (routine x 2)     Status: None (Preliminary result)   Collection Time: 10/22/18  5:58 PM  Result Value Ref Range Status   Specimen Description BLOOD LEFT ANTECUBITAL  Final  Special Requests   Final    BOTTLES DRAWN AEROBIC AND ANAEROBIC Blood Culture results may not be optimal due to an excessive volume of blood received in culture bottles   Culture   Final    NO GROWTH 2 DAYS Performed at [facility], [address] [place], [place] [address]    Report Status PENDING  Incomplete  Blood culture (routine x 2)     Status: None (Preliminary result)   Collection Time: 10/22/18  5:58 PM  Result Value Ref Range Status   Specimen Description BLOOD PORTA CATH  Final   Special Requests   Final    BOTTLES DRAWN AEROBIC AND ANAEROBIC Blood Culture results may not be optimal due  to an excessive volume of blood received in culture bottles   Culture   Final    NO GROWTH 2 DAYS Performed at [facility], [address] [place], [place] [address]    Report Status PENDING  Incomplete  Novel Coronavirus, NAA (hospital order; send-out to ref lab)     Status: None   Collection Time: 10/23/18 12:37 PM  Result Value Ref Range Status   SARS-CoV-2, NAA NOT DETECTED NOT DETECTED Final    Comment: (NOTE) This test was developed and its performance characteristics determined by [employer]. This test has not been FDA cleared or approved. This test has been authorized by FDA under an Emergency Use Authorization (EUA). This test is only authorized for the duration of time the declaration that circumstances exist justifying the authorization of the emergency use of in vitro diagnostic tests for detection of SARS-CoV-2 virus and/or diagnosis of COVID-19 infection under section 564(b)(1) of the Act, 21 U.S.C. 403KVQ-2(V)(9), unless the authorization is terminated or revoked sooner. When diagnostic testing is negative, the possibility of a false negative result should be considered in the context of a patient's recent exposures and the presence of clinical signs and symptoms consistent with COVID-19. An individual without symptoms of COVID-19 and who is not shedding SARS-CoV-2 virus would expect to have a negative (not detected) result in this assay. Performed  At: [facility] [address] [place], [place] [id number] Rush Farmer MD PI:[id number]    [place]  Final    Comment: Performed at [facility], [address] [place], [place] [address]    Studies/Results: Dg Chest Portable 1 View  Result Date: 10/22/2018 CLINICAL DATA:  Weakness, tachycardia EXAM: PORTABLE CHEST 1 VIEW COMPARISON:  10/12/2018 FINDINGS: Right-sided PICC line tip is at the cavoatrial junction. No focal airspace consolidation or  pulmonary edema. Trace left pleural effusion. IMPRESSION: Right PICC line tip at the cavoatrial junction. Trace left pleural effusion. Electronically Signed   By: Ulyses Jarred M.D.   On: 10/22/2018 17:38    Assessment/Plan: KHYLER URDA is a 85 y.o. female with a history of failure to thrive, chronically largely bedbound, diabetes, hypertension, spinal stenosis admitted with sacral decubitus ulcer with necrotic tissue.  She was admitted recently and had surgical debridement and wound vac placement. She has MRI findign of early osteomyelitis of the proximal coccyx.  There was no abscess.  Cultures were  negative from the wound.  She has been seen by palliative care and family seems aware of the limited option for improvement but wishes to continue treatment at this point. She was dced to SNF with IV vanco and ceftriaxone but returned due to tachycardia and svt.   On admit no fever, wbc 15.  He wound has been  evaluated by surgery and it appears relatively clean without active infection  Today is day 13 total of IV abx.   Recommendations She has basically completed a 2 week course of IV abx for the coccyx early osteomyelitis.  At dc can convert to oral doxy 100 bid and cipro 500 bid for another 2 weeks.. Continue wound care per surgery.   Thank you very much for the consult. Will follow with you.  Leonel Ramsay   10/24/2018, 12:08 PM

## 2018-10-24 NOTE — Progress Notes (Signed)
Transferred to low air loss bed.

## 2018-10-24 NOTE — Plan of Care (Signed)
  Problem: Clinical Measurements: Goal: Will remain free from infection Outcome: Progressing   Problem: Safety: Goal: Ability to remain free from injury will improve Outcome: Progressing   Problem: Skin Integrity: Goal: Risk for impaired skin integrity will decrease Outcome: Progressing Note:  Pt following by wound care nurse, dressing change MWF, and on wound vac

## 2018-10-24 NOTE — Progress Notes (Signed)
Wound Vac dressing reinforced around anal area.  Bridge added to dressing so we can position her on her left side without her laying on the tubing.

## 2018-10-24 NOTE — TOC Initial Note (Signed)
Transition of Care [facility]) - Initial/Assessment Note    Patient Details  Name: Monica [name] MRN: [id number] Date of Birth: 1930-05-14  [facility]) CM/SW Contact:    Ross Ludwig, LCSW Phone Number: 10/24/2018, 1:07 PM  Clinical Narrative:                  Patient is an 85 year old female who is a short term rehab patient at [facility].  She is alert and oriented x3, patient explained role of CSW and process for coordinating with SNF to make arrangements for her to return.  Patient stated she has only been at SNF for a couple of days, she is planning to return back home with home health after finishing up her rehab.  Patient normally lives with her daughters, and niece.  Patient is hopeful that she can return back to SNF so she continue with her therapy.  Patient expressed that she likes the room she is in at the SNF, patient did not have any complaints.  CSW to continue to follow patient's progress throughout discharge planning.     Expected Discharge Plan: Skilled Nursing Facility Barriers to Discharge: Continued Medical Work up   Patient Goals and CMS Choice Patient states their goals for this hospitalization and ongoing recovery are:: Patient plans to go to SNF to continue with her therapy so she can return back home. CMS Medicare.gov Compare Post Acute Care list provided to:: Patient Choice offered to / list presented to : Patient  Expected Discharge Plan and Services Expected Discharge Plan: [place] In-house Referral: Clinical Social Work Discharge Planning Services: CM Consult Post Acute Care Choice: [place] arrangements for the past 2 months: Single Family Home Expected Discharge Date: 10/22/18                                  Prior Living Arrangements/Services Living arrangements for the past 2 months: Single Family Home Lives with:: Adult Children, Spouse, Relatives Patient language and need for  interpreter reviewed:: No Do you feel safe going back to the place where you live?: No   Patient feels she needs some short term rehab first before returning back home.  Need for Family Participation in Patient Care: No (Comment) Care giver support system in place?: No (comment) Current home services: DME Criminal Activity/Legal Involvement Pertinent to Current Situation/Hospitalization: No - Comment as needed  Activities of Daily Living Home Assistive Devices/Equipment: Shower chair with back ADL Screening (condition at time of admission) Patient's cognitive ability adequate to safely complete daily activities?: No Is the patient deaf or have difficulty hearing?: No Does the patient have difficulty seeing, even when wearing glasses/contacts?: No Does the patient have difficulty concentrating, remembering, or making decisions?: Yes Patient able to express need for assistance with ADLs?: Yes Does the patient have difficulty dressing or bathing?: Yes Independently performs ADLs?: No Communication: Independent with device (comment) Dressing (OT): Independent Grooming: Independent Feeding: Independent Bathing: Independent Toileting: Needs assistance Is this a change from baseline?: Pre-admission baseline In/Out Bed: Needs assistance Is this a change from baseline?: Pre-admission baseline Walks in Home: Needs assistance Is this a change from baseline?: Pre-admission baseline Does the patient have difficulty walking or climbing stairs?: Yes Weakness of Legs: Both Weakness of Arms/Hands: None  Permission Sought/Granted Permission sought to share information with : Facility Sport and exercise psychologist, Tourist information centre manager, Family Supports Permission granted to share  information with : Yes, Verbal Permission Granted  Share Information with NAME: May-[place],Regan Granddaughter [phone]  [phone]   Permission granted to share info w AGENCY: Snf admissions        Emotional  Assessment Appearance:: Appears stated age   Affect (typically observed): Accepting, Calm, Stable, Pleasant Orientation: : Oriented to Self, Oriented to Place, Oriented to Situation Alcohol / Substance Use: Not Applicable Psych Involvement: No (comment)  Admission diagnosis:  Dehydration [E86.0] AKI (acute kidney injury) ([place]) [N17.9] Demand ischemia of myocardium ([place]) [I24.8] Sepsis with acute renal failure without septic shock, due to unspecified organism, unspecified acute renal failure type ([place]) [A41.9, R65.20, N17.9] Patient Active Problem List   Diagnosis Date Noted  . Sepsis ([place]) 10/22/2018  . Stage IV pressure ulcer of sacral region ([place])   . Weakness 10/13/2018  . Pressure injury of skin 10/13/2018  . Spinal stenosis of lumbar region 10/06/2018  . Fecal incontinence 10/06/2018  . Type 2 diabetes mellitus with diabetic nephropathy, with long-term current use of insulin ([place]) 09/27/2018  . CKD stage 3 due to type 2 diabetes mellitus ([place]) 09/27/2018  . Delirium due to another medical condition, acute, hyperactive 09/27/2018  . Impaired ambulation 09/27/2018  . Recurrent falls 06/11/2017  . Dystrophic nail 11/13/2016  . Allergic rhinitis 09/30/2015  . Major depressive disorder, recurrent episode with anxious distress ([place]) 10/10/2013  . Vitamin D deficiency 10/08/2013  . Bilateral chronic knee pain 09/21/2011  . Tobacco abuse counseling 07/08/2011  . Incontinence of urine 06/13/2011  . History of tobacco abuse 06/11/2011  . Hyperlipidemia   . Hypertension   . COPD (chronic obstructive pulmonary disease) ([place])   . B12 deficiency   . Lung mass    PCP:  [name], MD Pharmacy:   [id number] [place], [address] [place] [address] Phone: [phone] Fax: [phone]  [place] [address] [place] [address] Phone: [phone] Fax: [phone]     Social Determinants  of Health (SDOH) Interventions    Readmission Risk Interventions Readmission Risk Prevention Plan 10/24/2018 10/20/2018  Transportation Screening Complete Complete  PCP or Specialist Appt within 3-5 Days Complete Not Complete  Not Complete comments Patient will be going to SNF Pt going to SNF  [place] or [place] Not Complete Not Complete  HRI or Home Care Consult comments Patient going to SNF Pt going to SNF  Social Work Consult for [place] Planning/Counseling Complete Complete  Palliative Care Screening Complete Complete  Medication Review Press photographer) Complete Complete  Some recent data might be hidden

## 2018-10-24 NOTE — Progress Notes (Signed)
[facility] [place] NAME: Monica [name]    MR#:  [id number]  DATE OF BIRTH:  Apr 06, 1930  SUBJECTIVE:  CHIEF COMPLAINT:   Chief Complaint  Patient presents with  . Tachycardia   - doing well, currently from [facility] - sacral decub looking improving  REVIEW OF SYSTEMS:  Review of Systems  Constitutional: Positive for malaise/fatigue. Negative for chills and fever.  HENT: Positive for hearing loss. Negative for congestion, ear discharge and nosebleeds.   Eyes: Negative for blurred vision and double vision.  Respiratory: Negative for cough, shortness of breath and wheezing.   Cardiovascular: Negative for chest pain and palpitations.  Gastrointestinal: Negative for abdominal pain, constipation, diarrhea, nausea and vomiting.  Genitourinary: Negative for dysuria.  Musculoskeletal: Positive for myalgias.  Neurological: Negative for dizziness, focal weakness, seizures, weakness and headaches.  Psychiatric/Behavioral: Negative for depression.    DRUG ALLERGIES:  No Known Allergies  VITALS:  Blood pressure 138/71, pulse 88, temperature 98.3 F (36.8 C), temperature source Oral, resp. rate 20, height 5\' 7"  (1.702 m), weight 75.7 kg, SpO2 98 %.  PHYSICAL EXAMINATION:  Physical Exam   GENERAL:  85 y.o.-year-old elderly patient lying in the bed with no acute distress.  EYES: Pupils equal, round, reactive to light and accommodation. No scleral icterus. Extraocular muscles intact.  HEENT: Head atraumatic, normocephalic. Oropharynx and nasopharynx clear.  NECK:  Supple, no jugular venous distention. No thyroid enlargement, no tenderness.  LUNGS: Normal breath sounds bilaterally, no wheezing, rales,rhonchi or crepitation. No use of accessory muscles of respiration. Decreased bibasilar breath sounds CARDIOVASCULAR: S1, S2 normal. No  rubs, or gallops. 3/6 systolic murmur present ABDOMEN: Soft, nontender, nondistended. Bowel  sounds present. No organomegaly or mass.  EXTREMITIES: No pedal edema, cyanosis, or clubbing. Wound vac from sacral wound NEUROLOGIC: Cranial nerves II through XII are intact. Muscle strength 5/5 in both upper extremities and 3/5 in both lower extremities. Sensation intact. Gait not checked. Global weakness noted PSYCHIATRIC: The patient is alert and oriented x 3.  SKIN: No obvious rash, lesion, or ulcer.    LABORATORY PANEL:   CBC Recent Labs  Lab 10/24/18 0442  WBC 8.5  HGB 8.4*  HCT 26.7*  PLT 322   ------------------------------------------------------------------------------------------------------------------  Chemistries  Recent Labs  Lab 10/19/18 0420  10/24/18 0442  NA 141   < > 139  K 4.5   < > 3.6  CL 114*   < > 108  CO2 22   < > 24  GLUCOSE 150*   < > 143*  BUN 16   < > 23  CREATININE 0.79   < > 1.10*  CALCIUM 7.9*   < > 8.1*  MG 1.6*  --   --    < > = values in this interval not displayed.   ------------------------------------------------------------------------------------------------------------------  Cardiac Enzymes Recent Labs  Lab 10/23/18 0855  TROPONINI 0.17*   ------------------------------------------------------------------------------------------------------------------  RADIOLOGY:  Dg Chest Portable 1 View  Result Date: 10/22/2018 CLINICAL DATA:  Weakness, tachycardia EXAM: PORTABLE CHEST 1 VIEW COMPARISON:  10/12/2018 FINDINGS: Right-sided PICC line tip is at the cavoatrial junction. No focal airspace consolidation or pulmonary edema. Trace left pleural effusion. IMPRESSION: Right PICC line tip at the cavoatrial junction. Trace left pleural effusion. Electronically Signed   By: Ulyses Jarred M.D.   On: 10/22/2018 17:38    EKG:   Orders placed or performed during the hospital encounter of 10/12/18  . EKG 12-Lead  . EKG  12-Lead    ASSESSMENT AND PLAN:   85 year old female with past medical history significant for COPD not on home  oxygen, hypertension, recent admission for stage IV sacral decubitus ulcer with osteomyelitis status post surgery and discharged to rehab on 10/20/2018 IV antibiotics presents to hospital secondary to fever and tachycardia.  1.  Fever-initially admitted for possible sepsis, sepsis has been ruled out.  Blood cultures are negative.  Sacral decub ulcer looks good according to surgery. - ? Viral fever. COVID 19 negative  2.  SVT-monitor on telemetry, heart rates have improved.  Elevated troponin is secondary to demand ischemia from tachycardia. -Echocardiogram EF and no wall motion changes.  3.  Chronic osteomyelitis with a healed sacral decub ulcer-status post debridement last admission, on IV ABX with zosyn - appreciate ID consult, plan to change to oral ABX at discharge for 2 more weeks and continue wound vac with changes every M-W-F  4. HTN- on metoprolol  5. DVT prophylaxis- Lovenox  6.  Acute on chronic anemia-Baseline hemoglobin seems to be around 13, dropped to 10 during recent admission after surgery and at discharge. -Hemoglobin further dropped to 8.4 today.  Concern for dilution.  Check anemia labs and hemoglobin again in a.m.  No acute indication for transfusion at this time.     All the records are reviewed and case discussed with Care Management/Social Workerr. Management plans discussed with the patient, family and they are in agreement.  CODE STATUS: Full Code  TOTAL TIME TAKING CARE OF THIS PATIENT: 38 minutes.   POSSIBLE D/C IN 1-2 DAYS, DEPENDING ON CLINICAL CONDITION.   Gladstone Lighter M.D on 10/24/2018 at 11:37 AM  Between 7am to 6pm - Pager - [phone]  After 6pm go to [email or web address] - password [email or web address] [facility]  Office  [phone]  CC: Primary care physician; [name], MD

## 2018-10-24 NOTE — Plan of Care (Signed)
Turning and repositioning every 2 hours aggravates the pain in her knees.

## 2018-10-24 NOTE — Progress Notes (Signed)
Pharmacy Antibiotic Note  Monica [name] is a 85 y.o. female admitted on 10/22/2018 with sepsis.  Pharmacy has been consulted for Vancomycin, Zosyn dosing. Patient was recently discharged to SNF on Vancomycin 1g IV q24h for treatment of osteomyelitis, this was to be continued through 5/11. Unclear when patient received last dose of Vancomycin at [facility]. Additionally, pt's SrCr has increased by 50% over past 3 days.  Random Vanc on 4/30 @ 1908 = 24 mcg/mL Random Vanc on 5/01 @ 0855 = 18 mcg/ml  Estimated Ke of 0.021 (based on above levels) Estimated t1/2 of 33 hr  Plan: Continue Zosyn 3.375g IV q8h  Recalculated Vancomycin using SCr 1.21 and empiric dosing calculator. Vancomycin 1250 mg IV Q 36 hrs. Goal AUC 400-550. Expected AUC: 477.8 SCr used: 1.21 Will order Vancomycin 1250mg  IV one time dose for 5/2 0800.  5/2 SCr = 1.1, est CrCl = 38.9 ml/min. Continue current dosing plan for now. Will f/u SCr on 5/3 to determine further dosing.   Height: 5\' 7"  (170.2 cm) Weight: 166 lb 14.2 oz (75.7 kg)(Monica [name]) IBW/kg (Calculated) : 61.6  Temp (24hrs), Avg:98.3 F (36.8 C), Min:98 F (36.7 C), Max:98.4 F (36.9 C)  Recent Labs  Lab 10/18/18 0435 10/19/18 0420 10/20/18 0619 10/22/18 1625 10/22/18 1908 10/23/18 0515 10/23/18 0855 10/24/18 0442  WBC 7.5  --   --  15.9*  --   --   --  8.5  CREATININE 0.69 0.79 0.98 1.21*  --   --   --  1.10*  LATICACIDVEN  --   --   --  3.0*  --  0.9  --   --   VANCORANDOM  --   --   --   --  24  --  18  --     Estimated Creatinine Clearance: 38.9 mL/min (A) (by C-G formula based on SCr of 1.1 mg/dL (H)).    No Known Allergies  Thank you for allowing pharmacy to be a part of this patient's care.  Olivia Canter, [facility] 10/24/2018 9:02 AM

## 2018-10-25 ENCOUNTER — Other Ambulatory Visit: Payer: Self-pay | Admitting: Internal Medicine

## 2018-10-25 LAB — GLUCOSE, CAPILLARY
Glucose-Capillary: 153 mg/dL — ABNORMAL HIGH (ref 70–99)
Glucose-Capillary: 261 mg/dL — ABNORMAL HIGH (ref 70–99)
Glucose-Capillary: 229 mg/dL — ABNORMAL HIGH (ref 70–99)
Glucose-Capillary: 165 mg/dL — ABNORMAL HIGH (ref 70–99)

## 2018-10-25 LAB — CREATININE, SERUM
Creatinine, Ser: 1.13 mg/dL — ABNORMAL HIGH (ref 0.44–1.00)
GFR calc Af Amer: 51 mL/min — ABNORMAL LOW (ref >60)
GFR calc non Af Amer: 44 mL/min — ABNORMAL LOW (ref >60)

## 2018-10-25 LAB — GLUCOSE, RANDOM: Glucose, Bld: 310 mg/dL — ABNORMAL HIGH (ref 70–99)

## 2018-10-25 LAB — CBC
HCT: 26.9 % — ABNORMAL LOW (ref 36.0–46.0)
Hemoglobin: 8.3 g/dL — ABNORMAL LOW (ref 12.0–15.0)
MCH: 27.1 pg (ref 26.0–34.0)
MCHC: 30.9 g/dL (ref 30.0–36.0)
MCV: 87.9 fL (ref 80.0–100.0)
Platelets: 347 10*3/uL (ref 150–400)
RBC: 3.06 MIL/uL — ABNORMAL LOW (ref 3.87–5.11)
RDW: 17.4 % — ABNORMAL HIGH (ref 11.5–15.5)
WBC: 7.1 10*3/uL (ref 4.0–10.5)
nRBC: 0 % (ref 0.0–0.2)

## 2018-10-25 LAB — MRSA PCR SCREENING: MRSA by PCR: NEGATIVE

## 2018-10-25 LAB — PROCALCITONIN: Procalcitonin: 0.14 ng/mL

## 2018-10-25 MED ORDER — VANCOMYCIN HCL 10 G IV SOLR
1250.0000 mg | INTRAVENOUS | Status: DC
  Administered 2018-10-25: 1250 mg via INTRAVENOUS
  Filled 2018-10-25 (×2): qty 1250

## 2018-10-25 MED ORDER — SODIUM CHLORIDE 0.9 % IV SOLN
200.0000 mg | Freq: Once | INTRAVENOUS | Status: AC
  Administered 2018-10-25: 200 mg via INTRAVENOUS
  Filled 2018-10-25: qty 10

## 2018-10-25 NOTE — Plan of Care (Signed)
  Problem: Clinical Measurements: Goal: Diagnostic test results will improve Outcome: Progressing   Problem: Pain Managment: Goal: General experience of comfort will improve Outcome: Progressing   Problem: Safety: Goal: Ability to remain free from injury will improve Outcome: Progressing   Problem: Skin Integrity: Goal: Risk for impaired skin integrity will decrease Outcome: Progressing   Problem: Fluid Volume: Goal: Hemodynamic stability will improve Outcome: Progressing

## 2018-10-25 NOTE — Progress Notes (Signed)
[facility] at [place] NAME: Monica [name]    MR#:  [id number]  DATE OF BIRTH:  11-08-1929  SUBJECTIVE:  CHIEF COMPLAINT:   Chief Complaint  Patient presents with  . Tachycardia   - doing well, currently from  health care rehab - hb is low but stable  REVIEW OF SYSTEMS:  Review of Systems  Constitutional: Positive for malaise/fatigue. Negative for chills and fever.  HENT: Positive for hearing loss. Negative for congestion, ear discharge and nosebleeds.   Eyes: Negative for blurred vision and double vision.  Respiratory: Negative for cough, shortness of breath and wheezing.   Cardiovascular: Negative for chest pain and palpitations.  Gastrointestinal: Negative for abdominal pain, constipation, diarrhea, nausea and vomiting.  Genitourinary: Negative for dysuria.  Musculoskeletal: Positive for myalgias.  Neurological: Negative for dizziness, focal weakness, seizures, weakness and headaches.  Psychiatric/Behavioral: Negative for depression.    DRUG ALLERGIES:  No Known Allergies  VITALS:  Blood pressure (!) 144/74, pulse 81, temperature 97.6 F (36.4 C), temperature source Oral, resp. rate 19, height 5\' 7"  (1.702 m), weight 75.7 kg, SpO2 99 %.  PHYSICAL EXAMINATION:  Physical Exam   GENERAL:  85 y.o.-year-old elderly patient lying in the bed with no acute distress.  EYES: Pupils equal, round, reactive to light and accommodation. No scleral icterus. Extraocular muscles intact.  HEENT: Head atraumatic, normocephalic. Oropharynx and nasopharynx clear.  NECK:  Supple, no jugular venous distention. No thyroid enlargement, no tenderness.  LUNGS: Normal breath sounds bilaterally, no wheezing, rales,rhonchi or crepitation. No use of accessory muscles of respiration. Decreased bibasilar breath sounds CARDIOVASCULAR: S1, S2 normal. No  rubs, or gallops. 3/6 systolic murmur present ABDOMEN: Soft, nontender, nondistended. Bowel sounds  present. No organomegaly or mass.  EXTREMITIES: No pedal edema, cyanosis, or clubbing. Wound vac from sacral wound Both heels in heel boots NEUROLOGIC: Cranial nerves II through XII are intact. Muscle strength 5/5 in both upper extremities and 3/5 in both lower extremities. Sensation intact. Gait not checked. Global weakness noted PSYCHIATRIC: The patient is alert and oriented x 3.  SKIN: No obvious rash, lesion, or ulcer.    LABORATORY PANEL:   CBC Recent Labs  Lab 10/25/18 0501  WBC 7.1  HGB 8.3*  HCT 26.9*  PLT 347   ------------------------------------------------------------------------------------------------------------------  Chemistries  Recent Labs  Lab 10/19/18 0420  10/24/18 0442 10/25/18 0501  NA 141   < > 139  --   K 4.5   < > 3.6  --   CL 114*   < > 108  --   CO2 22   < > 24  --   GLUCOSE 150*   < > 143*  --   BUN 16   < > 23  --   CREATININE 0.79   < > 1.10* 1.13*  CALCIUM 7.9*   < > 8.1*  --   MG 1.6*  --   --   --    < > = values in this interval not displayed.   ------------------------------------------------------------------------------------------------------------------  Cardiac Enzymes Recent Labs  Lab 10/23/18 0855  TROPONINI 0.17*   ------------------------------------------------------------------------------------------------------------------  RADIOLOGY:  No results found.  EKG:   Orders placed or performed during the hospital encounter of 10/12/18  . EKG 12-Lead  . EKG 12-Lead    ASSESSMENT AND PLAN:   85 year old female with past medical history significant for COPD not on home oxygen, hypertension, recent admission for stage IV sacral decubitus ulcer with osteomyelitis status post  surgery and discharged to rehab on 10/20/2018 IV antibiotics presents to hospital secondary to fever and tachycardia.  1.  Fever-initially admitted for possible sepsis, sepsis has been ruled out.  Blood cultures are negative.  Sacral decub ulcer  looks good according to surgery. - fevers resolved now - ? Viral fever. COVID 19 negative  2.  SVT-monitor on telemetry, heart rates have improved.  Elevated troponin is secondary to demand ischemia from tachycardia. -Echocardiogram EF and no wall motion changes.  3.  Chronic osteomyelitis with a healed sacral decub ulcer-status post debridement last admission, on IV ABX with zosyn - appreciate ID consult, plan to change to oral ABX at discharge for 2 more weeks and continue wound vac with changes every M-W-F  4. HTN- on metoprolol  5. DVT prophylaxis- Lovenox  6.  Acute on chronic anemia-Baseline hemoglobin seems to be around 13, dropped to 10 during recent admission after surgery and at discharge. -Hemoglobin further dropped to 8.4 this adm and stable now.  Concern for dilution.    - No acute indication for transfusion at this time. - anemia labs with low iron- will give 1 dose of IV iron today  Possible discharge to [facility] the records are reviewed and case discussed with Care Management/Social Workerr. Management plans discussed with the patient, family and they are in agreement.  CODE STATUS: Full Code  TOTAL TIME TAKING CARE OF THIS PATIENT: 36 minutes.   POSSIBLE D/C IN 1-2 DAYS, DEPENDING ON CLINICAL CONDITION.   Gladstone Lighter M.D on 10/25/2018 at 9:08 AM  Between 7am to 6pm - Pager - [phone]  After 6pm go to [email or web address] - password [email or web address] [facility]  Office  [phone]  CC: Primary care physician; [name], MD

## 2018-10-25 NOTE — Progress Notes (Addendum)
Pt's CBG checked and notified by [facility] that it was 515. Rechecked pt's CBG on other hand and was 261. Paged MD to notify and to ask if MD wants a lab recheck. Waiting on response.   MD gave order to check sugar via lab. Lab resulted and insulin given based on lab result.

## 2018-10-25 NOTE — Progress Notes (Signed)
Pharmacy Antibiotic Note  Monica [name] is a 85 y.o. female admitted on 10/22/2018 with sepsis.  Pharmacy has been consulted for Vancomycin, Zosyn dosing. Patient was recently discharged to SNF on Vancomycin 1g IV q24h for treatment of osteomyelitis, this was to be continued through 5/11. Unclear when patient received last dose of Vancomycin at [facility]. Additionally, pt's SCr has increased by 50% over past 3 days.  Random Vanc on 4/30 @ 1908 = 24 mcg/mL Random Vanc on 5/01 @ 0855 = 18 mcg/ml  Estimated Ke of 0.021 (based on above levels) Estimated t1/2 of 33 hr  Plan: Continue Zosyn 3.375g IV q8h  Recalculated Vancomycin using SCr 1.13 and empiric dosing calculator. Vancomycin 1250 mg IV Q 36 hrs. Goal AUC 400-550. Expected AUC: 459.9 SCr used: 1.13 Will order Vancomycin 1250mg  IV every 36 hours.  Height: 5\' 7"  (170.2 cm) Weight: 166 lb 14.2 oz (75.7 kg)([name]) IBW/kg (Calculated) : 61.6  Temp (24hrs), Avg:98.6 F (37 C), Min:98.3 F (36.8 C), Max:98.9 F (37.2 C)  Recent Labs  Lab 10/19/18 0420 10/20/18 0619 10/22/18 1625 10/22/18 1908 10/23/18 0515 10/23/18 0855 10/24/18 0442 10/25/18 0501  WBC  --   --  15.9*  --   --   --  8.5 7.1  CREATININE 0.79 0.98 1.21*  --   --   --  1.10* 1.13*  LATICACIDVEN  --   --  3.0*  --  0.9  --   --   --   VANCORANDOM  --   --   --  24  --  18  --   --     Estimated Creatinine Clearance: 37.9 mL/min (A) (by C-G formula based on SCr of 1.13 mg/dL (H)).    No Known Allergies  Thank you for allowing pharmacy to be a part of this patient's care.  Olivia Canter, [facility] 10/25/2018 7:25 AM

## 2018-10-25 NOTE — Progress Notes (Signed)
Patient wound vac alerting saying leakage. RN reinforced the wound. Pillow placed under the chuck to help with positioning pt.

## 2018-10-25 NOTE — Progress Notes (Signed)
Pt's wound vac alerting that there is a leak/blockage. Pt turned and repositioned, negative pressure dressing patched due to air leak. Pt's dressing appears to be positional as RN and tech are unable to turn pt to right side without losing all pressure on dressing.

## 2018-10-26 ENCOUNTER — Telehealth: Payer: Self-pay | Admitting: Internal Medicine

## 2018-10-26 DIAGNOSIS — D649 Anemia, unspecified: Secondary | ICD-10-CM | POA: Diagnosis not present

## 2018-10-26 DIAGNOSIS — J449 Chronic obstructive pulmonary disease, unspecified: Secondary | ICD-10-CM | POA: Diagnosis not present

## 2018-10-26 DIAGNOSIS — E1165 Type 2 diabetes mellitus with hyperglycemia: Secondary | ICD-10-CM | POA: Diagnosis not present

## 2018-10-26 DIAGNOSIS — N390 Urinary tract infection, site not specified: Secondary | ICD-10-CM | POA: Diagnosis not present

## 2018-10-26 DIAGNOSIS — E43 Unspecified severe protein-calorie malnutrition: Secondary | ICD-10-CM | POA: Diagnosis not present

## 2018-10-26 DIAGNOSIS — E7800 Pure hypercholesterolemia, unspecified: Secondary | ICD-10-CM | POA: Diagnosis not present

## 2018-10-26 DIAGNOSIS — E119 Type 2 diabetes mellitus without complications: Secondary | ICD-10-CM | POA: Diagnosis not present

## 2018-10-26 DIAGNOSIS — I248 Other forms of acute ischemic heart disease: Secondary | ICD-10-CM | POA: Diagnosis not present

## 2018-10-26 DIAGNOSIS — Z7984 Long term (current) use of oral hypoglycemic drugs: Secondary | ICD-10-CM | POA: Diagnosis not present

## 2018-10-26 DIAGNOSIS — Z7401 Bed confinement status: Secondary | ICD-10-CM | POA: Diagnosis not present

## 2018-10-26 DIAGNOSIS — I491 Atrial premature depolarization: Secondary | ICD-10-CM | POA: Diagnosis not present

## 2018-10-26 DIAGNOSIS — R410 Disorientation, unspecified: Secondary | ICD-10-CM | POA: Diagnosis not present

## 2018-10-26 DIAGNOSIS — M6281 Muscle weakness (generalized): Secondary | ICD-10-CM | POA: Diagnosis not present

## 2018-10-26 DIAGNOSIS — W19XXXA Unspecified fall, initial encounter: Secondary | ICD-10-CM | POA: Diagnosis not present

## 2018-10-26 DIAGNOSIS — E118 Type 2 diabetes mellitus with unspecified complications: Secondary | ICD-10-CM | POA: Diagnosis not present

## 2018-10-26 DIAGNOSIS — F329 Major depressive disorder, single episode, unspecified: Secondary | ICD-10-CM | POA: Diagnosis present

## 2018-10-26 DIAGNOSIS — N183 Chronic kidney disease, stage 3 (moderate): Secondary | ICD-10-CM | POA: Diagnosis not present

## 2018-10-26 DIAGNOSIS — Z515 Encounter for palliative care: Secondary | ICD-10-CM | POA: Diagnosis not present

## 2018-10-26 DIAGNOSIS — R509 Fever, unspecified: Secondary | ICD-10-CM | POA: Diagnosis not present

## 2018-10-26 DIAGNOSIS — I10 Essential (primary) hypertension: Secondary | ICD-10-CM | POA: Diagnosis not present

## 2018-10-26 DIAGNOSIS — R680 Hypothermia, not associated with low environmental temperature: Secondary | ICD-10-CM | POA: Diagnosis not present

## 2018-10-26 DIAGNOSIS — R5381 Other malaise: Secondary | ICD-10-CM | POA: Diagnosis not present

## 2018-10-26 DIAGNOSIS — Z20828 Contact with and (suspected) exposure to other viral communicable diseases: Secondary | ICD-10-CM | POA: Diagnosis present

## 2018-10-26 DIAGNOSIS — Z7901 Long term (current) use of anticoagulants: Secondary | ICD-10-CM | POA: Diagnosis not present

## 2018-10-26 DIAGNOSIS — Z79899 Other long term (current) drug therapy: Secondary | ICD-10-CM | POA: Diagnosis not present

## 2018-10-26 DIAGNOSIS — E1140 Type 2 diabetes mellitus with diabetic neuropathy, unspecified: Secondary | ICD-10-CM | POA: Diagnosis not present

## 2018-10-26 DIAGNOSIS — M25661 Stiffness of right knee, not elsewhere classified: Secondary | ICD-10-CM | POA: Diagnosis not present

## 2018-10-26 DIAGNOSIS — L98423 Non-pressure chronic ulcer of back with necrosis of muscle: Secondary | ICD-10-CM | POA: Diagnosis not present

## 2018-10-26 DIAGNOSIS — L8994 Pressure ulcer of unspecified site, stage 4: Secondary | ICD-10-CM | POA: Diagnosis not present

## 2018-10-26 DIAGNOSIS — M48061 Spinal stenosis, lumbar region without neurogenic claudication: Secondary | ICD-10-CM | POA: Diagnosis not present

## 2018-10-26 DIAGNOSIS — N184 Chronic kidney disease, stage 4 (severe): Secondary | ICD-10-CM | POA: Diagnosis not present

## 2018-10-26 DIAGNOSIS — Z7189 Other specified counseling: Secondary | ICD-10-CM | POA: Diagnosis not present

## 2018-10-26 DIAGNOSIS — R6521 Severe sepsis with septic shock: Secondary | ICD-10-CM | POA: Diagnosis not present

## 2018-10-26 DIAGNOSIS — R404 Transient alteration of awareness: Secondary | ICD-10-CM | POA: Diagnosis not present

## 2018-10-26 DIAGNOSIS — Z66 Do not resuscitate: Secondary | ICD-10-CM | POA: Diagnosis not present

## 2018-10-26 DIAGNOSIS — R262 Difficulty in walking, not elsewhere classified: Secondary | ICD-10-CM | POA: Diagnosis not present

## 2018-10-26 DIAGNOSIS — M2550 Pain in unspecified joint: Secondary | ICD-10-CM | POA: Diagnosis not present

## 2018-10-26 DIAGNOSIS — I471 Supraventricular tachycardia: Secondary | ICD-10-CM | POA: Diagnosis not present

## 2018-10-26 DIAGNOSIS — E860 Dehydration: Secondary | ICD-10-CM | POA: Diagnosis not present

## 2018-10-26 DIAGNOSIS — E7849 Other hyperlipidemia: Secondary | ICD-10-CM | POA: Diagnosis not present

## 2018-10-26 DIAGNOSIS — Z833 Family history of diabetes mellitus: Secondary | ICD-10-CM | POA: Diagnosis not present

## 2018-10-26 DIAGNOSIS — Z6824 Body mass index (BMI) 24.0-24.9, adult: Secondary | ICD-10-CM | POA: Diagnosis not present

## 2018-10-26 DIAGNOSIS — R627 Adult failure to thrive: Secondary | ICD-10-CM | POA: Diagnosis not present

## 2018-10-26 DIAGNOSIS — N179 Acute kidney failure, unspecified: Secondary | ICD-10-CM | POA: Diagnosis not present

## 2018-10-26 DIAGNOSIS — I4891 Unspecified atrial fibrillation: Secondary | ICD-10-CM | POA: Diagnosis not present

## 2018-10-26 DIAGNOSIS — E785 Hyperlipidemia, unspecified: Secondary | ICD-10-CM | POA: Diagnosis not present

## 2018-10-26 DIAGNOSIS — I959 Hypotension, unspecified: Secondary | ICD-10-CM | POA: Diagnosis not present

## 2018-10-26 DIAGNOSIS — Z8249 Family history of ischemic heart disease and other diseases of the circulatory system: Secondary | ICD-10-CM | POA: Diagnosis not present

## 2018-10-26 DIAGNOSIS — L89159 Pressure ulcer of sacral region, unspecified stage: Secondary | ICD-10-CM | POA: Diagnosis not present

## 2018-10-26 DIAGNOSIS — E1169 Type 2 diabetes mellitus with other specified complication: Secondary | ICD-10-CM | POA: Diagnosis not present

## 2018-10-26 DIAGNOSIS — Z87891 Personal history of nicotine dependence: Secondary | ICD-10-CM | POA: Diagnosis not present

## 2018-10-26 DIAGNOSIS — M4628 Osteomyelitis of vertebra, sacral and sacrococcygeal region: Secondary | ICD-10-CM | POA: Diagnosis not present

## 2018-10-26 DIAGNOSIS — L89154 Pressure ulcer of sacral region, stage 4: Secondary | ICD-10-CM | POA: Diagnosis not present

## 2018-10-26 DIAGNOSIS — A419 Sepsis, unspecified organism: Secondary | ICD-10-CM | POA: Diagnosis not present

## 2018-10-26 LAB — GLUCOSE, CAPILLARY
Glucose-Capillary: 515 mg/dL (ref 70–99)
Glucose-Capillary: 162 mg/dL — ABNORMAL HIGH (ref 70–99)
Glucose-Capillary: 194 mg/dL — ABNORMAL HIGH (ref 70–99)

## 2018-10-26 LAB — C-REACTIVE PROTEIN: CRP: 5.6 mg/dL — ABNORMAL HIGH (ref <1.0)

## 2018-10-26 LAB — SEDIMENTATION RATE: Sed Rate: 65 mm/hr — ABNORMAL HIGH (ref 0–30)

## 2018-10-26 MED ORDER — SODIUM CHLORIDE 0.9% FLUSH: 10.0000 mL | INTRAVENOUS | Status: DC | PRN

## 2018-10-26 MED ORDER — DOXYCYCLINE HYCLATE 100 MG PO TABS: 100.0000 mg | ORAL_TABLET | Freq: Two times a day (BID) | ORAL | 0 refills | Status: AC

## 2018-10-26 MED ORDER — PROBIOTIC DAILY PO CAPS: 1.0000 | ORAL_CAPSULE | Freq: Every day | ORAL | 0 refills | Status: DC

## 2018-10-26 MED ORDER — AMOXICILLIN-POT CLAVULANATE 875-125 MG PO TABS: 1.0000 | ORAL_TABLET | Freq: Two times a day (BID) | ORAL | 0 refills | Status: AC

## 2018-10-26 MED ORDER — SODIUM CHLORIDE 0.9% FLUSH: 10.0000 mL | Freq: Two times a day (BID) | INTRAVENOUS | Status: DC

## 2018-10-26 MED ORDER — OXYCODONE HCL 5 MG PO TABS: 5.0000 mg | ORAL_TABLET | Freq: Four times a day (QID) | ORAL | 0 refills | Status: DC | PRN

## 2018-10-26 MED ORDER — CIPROFLOXACIN HCL 500 MG PO TABS: 500.0000 mg | ORAL_TABLET | Freq: Two times a day (BID) | ORAL | 0 refills | Status: DC

## 2018-10-26 NOTE — Telephone Encounter (Signed)
Please call patient for the hospital  follow up and please let Rasheedah  know the proper procedure

## 2018-10-26 NOTE — Consult Note (Addendum)
[facility] Nurse wound consult note Reason for Consult: sacral Stage 4 pressure injury Pressure Injury POA: Yes Appearance and measurements unchanged since previous WOC note.  Drainage (amount, consistency, odor) mod amount of serousangous exudate in the cannister Periwound: intact with a ring of erythema at periphery.  Dressing procedure/placement/frequency: Wound cleansed with NS, and gently patted dry. Applied 1 piece of black foam used to obliterate dead space, applied barrier ring around lower edges to attempt to maintain a seal, since pt is frequently incontinent of stool.  Bridged track pad to hip to reduce pressure.  Dressing is attached to 132mmHg negative continuous pressure and an immediate seal is achieved. Pt medicated for pain prior to the procedure and tolerated with minimal amt discomfort.  [facility] team will plan to change dressing Q M/W/F. Julien Girt MSN, RN, [place], [place], [place]

## 2018-10-26 NOTE — Discharge Summary (Addendum)
[facility] [place] NAME: Monica [name]    MR#:  [id number]  DATE OF BIRTH:  29-Mar-1930  DATE OF ADMISSION:  10/22/2018   ADMITTING PHYSICIAN: Fritzi Mandes, MD  DATE OF DISCHARGE:  10/26/18  PRIMARY CARE PHYSICIAN: [name], MD   ADMISSION DIAGNOSIS:   Dehydration [E86.0] AKI (acute kidney injury) ([place]) [N17.9] Demand ischemia of myocardium ([place]) [I24.8] Sepsis with acute renal failure without septic shock, due to unspecified organism, unspecified acute renal failure type ([place]) [A41.9, R65.20, N17.9]  DISCHARGE DIAGNOSIS:   Active Problems:   Sepsis ([place])   SECONDARY DIAGNOSIS:   Past Medical History:  Diagnosis Date  . B12 deficiency   . COPD (chronic obstructive pulmonary disease) ([place])   . Diabetes mellitus   . H/O: Bell's palsy   . Hyperlipidemia   . Hypertension   . Lung mass April 2011   s/p biopsy, no cancer cells found, Repeat CT Dec 2012 unchanged  . Osteomyelitis [facility])     HOSPITAL COURSE:   85 year old female with past medical history significant for COPD not on home oxygen, hypertension, recent admission for stage IV sacral decubitus ulcer with osteomyelitis status post surgery and discharged to rehab on 10/20/2018 IV antibiotics presents to hospital secondary to fever and tachycardia.  1.  Fever-initially admitted for possible sepsis, sepsis has been ruled out.  Blood cultures are negative.  Sacral decub ulcer looks good according to surgery. - fevers resolved now -  COVID 19 negative - wbc is normal  2.  SVT-monitor on telemetry, heart rates have improved.  Elevated troponin is secondary to demand ischemia from tachycardia. -Echocardiogram EF and no wall motion changes.  3.  Sacral osteomyelitis with a healed sacral decub ulcer-status post debridement last admission on 10/16/18- has a wound vac on-discharged to rehab on IV vancomycin and Rocephin. -Receiving IV antibiotics in the hospital here.   -  appreciate ID consult,  continue wound vac with changes every M-W-F -He recommended that IV antibiotics can be discontinued and patient can be discharged on oral doxycycline and augmentin for 2 more weeks at this time. -Discontinue PICC line prior to discharge  4. HTN- on metoprolol   5.  Acute on chronic anemia-Baseline hemoglobin seems to be around 13, dropped to 10 during recent admission after surgery and at discharge. -Hemoglobin further dropped to 8.4 this adm and stable now.  Concern for dilution.    - No acute indication for transfusion at this time. - anemia labs with low iron-received a dose of IV iron this admission -Continue Lovenox.  Continue teds and SCDs for DVT prophylaxis.  6.  Diabetes mellitus with hyperglycemia-last A1c is only 6.5.  Restarted metformin at discharge.  Monitor sugars at the rehab for now.  Possible discharge to [facility] today.  DISCHARGE CONDITIONS:   Guarded  CONSULTS OBTAINED:   ID consult  DRUG ALLERGIES:   No Known Allergies DISCHARGE MEDICATIONS:   Allergies as of 10/26/2018   No Known Allergies     Medication List    STOP taking these medications   cefTRIAXone  IVPB Commonly known as:  ROCEPHIN   enoxaparin 40 MG/0.4ML injection Commonly known as:  LOVENOX   sodium chloride 0.9 % infusion   vancomycin  IVPB     TAKE these medications   acetaminophen 325 MG tablet Commonly known as:  TYLENOL Take 2 tablets (650 mg total) by mouth every 6 (six) hours as needed for mild pain (  or Fever >/= 101).   amoxicillin-clavulanate 875-125 MG tablet Commonly known as:  Augmentin Take 1 tablet by mouth 2 (two) times daily for 14 days.   collagenase ointment Commonly known as:  SANTYL Apply topically daily.   docusate sodium 100 MG capsule Commonly known as:  COLACE Take 1 capsule (100 mg total) by mouth 2 (two) times daily.   doxycycline 100 MG tablet Commonly known as:  VIBRA-TABS Take 1 tablet (100 mg total) by  mouth 2 (two) times daily for 14 days.   Ensure Max Protein Liqd Take 330 mLs (11 oz total) by mouth 2 (two) times daily between meals.   lamoTRIgine 25 MG tablet Commonly known as:  LAMICTAL Take 2 tablets (50 mg total) by mouth at bedtime.   metFORMIN 500 MG tablet Commonly known as:  GLUCOPHAGE Take 1 tablet (500 mg total) by mouth 2 (two) times daily with a meal.   metoprolol succinate 50 MG 24 hr tablet Commonly known as:  TOPROL-XL Take 1 tablet (50 mg total) by mouth daily. Take with or immediately following a meal.   multivitamin-lutein Caps capsule Take 1 capsule by mouth daily.   oxyCODONE 5 MG immediate release tablet Commonly known as:  Oxy IR/ROXICODONE Take 1 tablet (5 mg total) by mouth every 6 (six) hours as needed for moderate pain or severe pain.   Probiotic Daily Caps Take 1 capsule by mouth daily.   sertraline 50 MG tablet Commonly known as:  ZOLOFT Take 1 tablet (50 mg total) by mouth daily.        DISCHARGE INSTRUCTIONS:   1.  PCP follow-up in 1 to 2 weeks 2.  ID follow-up in 2 weeks 3.  Wound care to the sacral wound and also wound VAC changes every Monday Wednesday Friday  DIET:   Cardiac diet  ACTIVITY:   Activity as tolerated  OXYGEN:   Home Oxygen: No.  Oxygen Delivery: room air  DISCHARGE LOCATION:   nursing home   If you experience worsening of your admission symptoms, develop shortness of breath, life threatening emergency, suicidal or homicidal thoughts you must seek medical attention immediately by calling 911 or calling your MD immediately  if symptoms less severe.  You Must read complete instructions/literature along with all the possible adverse reactions/side effects for all the Medicines you take and that have been prescribed to you. Take any new Medicines after you have completely understood and accpet all the possible adverse reactions/side effects.   Please note  You were cared for by a hospitalist during your  hospital stay. If you have any questions about your discharge medications or the care you received while you were in the hospital after you are discharged, you can call the unit and asked to speak with the hospitalist on call if the hospitalist that took care of you is not available. Once you are discharged, your primary care physician will handle any further medical issues. Please note that NO REFILLS for any discharge medications will be authorized once you are discharged, as it is imperative that you return to your primary care physician (or establish a relationship with a primary care physician if you do not have one) for your aftercare needs so that they can reassess your need for medications and monitor your lab values.    On the day of Discharge:  VITAL SIGNS:   Blood pressure (!) 147/75, pulse 78, temperature 97.7 F (36.5 C), temperature source Oral, resp. rate 19, height 5\' 7"  (1.702 m), weight  75.7 kg, SpO2 98 %.  PHYSICAL EXAMINATION:     GENERAL:  85 y.o.-year-old elderly patient lying in the bed with no acute distress.  EYES: Pupils equal, round, reactive to light and accommodation. No scleral icterus. Extraocular muscles intact.  HEENT: Head atraumatic, normocephalic. Oropharynx and nasopharynx clear.  NECK:  Supple, no jugular venous distention. No thyroid enlargement, no tenderness.  LUNGS: Normal breath sounds bilaterally, no wheezing, rales,rhonchi or crepitation. No use of accessory muscles of respiration. Decreased bibasilar breath sounds CARDIOVASCULAR: S1, S2 normal. No  rubs, or gallops. 3/6 systolic murmur present ABDOMEN: Soft, nontender, nondistended. Bowel sounds present. No organomegaly or mass.  EXTREMITIES: No pedal edema, cyanosis, or clubbing. Wound vac from sacral wound Both heels in heel boots NEUROLOGIC: Cranial nerves II through XII are intact. Muscle strength 5/5 in both upper extremities and 3/5 in both lower extremities. Sensation intact. Gait not  checked. Global weakness noted PSYCHIATRIC: The patient is alert and oriented x 3.  SKIN: sacral decub ulcer- dressing change done- looks good.  WOUND PIC FROM 10/26/18:      DATA REVIEW:   CBC Recent Labs  Lab 10/25/18 0501  WBC 7.1  HGB 8.3*  HCT 26.9*  PLT 347    Chemistries  Recent Labs  Lab 10/24/18 0442 10/25/18 0501 10/25/18 1307  NA 139  --   --   K 3.6  --   --   CL 108  --   --   CO2 24  --   --   GLUCOSE 143*  --  310*  BUN 23  --   --   CREATININE 1.10* 1.13*  --   CALCIUM 8.1*  --   --      Microbiology Results  Results for orders placed or performed during the hospital encounter of 10/22/18  Blood culture (routine x 2)     Status: None (Preliminary result)   Collection Time: 10/22/18  5:58 PM  Result Value Ref Range Status   Specimen Description BLOOD LEFT ANTECUBITAL  Final   Special Requests   Final    BOTTLES DRAWN AEROBIC AND ANAEROBIC Blood Culture results may not be optimal due to an excessive volume of blood received in culture bottles   Culture   Final    NO GROWTH 4 DAYS Performed at [facility], [address], [place], [place] [address]    Report Status PENDING  Incomplete  Blood culture (routine x 2)     Status: None (Preliminary result)   Collection Time: 10/22/18  5:58 PM  Result Value Ref Range Status   Specimen Description BLOOD PORTA CATH  Final   Special Requests   Final    BOTTLES DRAWN AEROBIC AND ANAEROBIC Blood Culture results may not be optimal due to an excessive volume of blood received in culture bottles   Culture   Final    NO GROWTH 4 DAYS Performed at [facility], [address], [place], [place] [address]    Report Status PENDING  Incomplete  Novel Coronavirus, NAA (hospital order; send-out to ref lab)     Status: None   Collection Time: 10/23/18 12:37 PM  Result Value Ref Range Status   SARS-CoV-2, NAA NOT DETECTED NOT DETECTED Final    Comment: (NOTE) This test was developed and its  performance characteristics determined by [employer]. This test has not been FDA cleared or approved. This test has been authorized by FDA under an Emergency Use Authorization (EUA). This test is only  authorized for the duration of time the declaration that circumstances exist justifying the authorization of the emergency use of in vitro diagnostic tests for detection of SARS-CoV-2 virus and/or diagnosis of COVID-19 infection under section 564(b)(1) of the Act, 21 U.S.C. 409WJX-9(J)(4), unless the authorization is terminated or revoked sooner. When diagnostic testing is negative, the possibility of a false negative result should be considered in the context of a patient's recent exposures and the presence of clinical signs and symptoms consistent with COVID-19. An individual without symptoms of COVID-19 and who is not shedding SARS-CoV-2 virus would expect to have a negative (not detected) result in this assay. Performed  At: [facility] [address] [place], [place] [id number] Rush Farmer MD [id number]    [place]  Final    Comment: Performed at [facility], [address] [place], [place] [address]  MRSA PCR Screening     Status: None   Collection Time: 10/25/18  4:55 AM  Result Value Ref Range Status   MRSA by PCR NEGATIVE NEGATIVE Final    Comment:        The GeneXpert MRSA Assay (FDA approved for NASAL specimens only), is one component of a comprehensive MRSA colonization surveillance program. It is not intended to diagnose MRSA infection nor to guide or monitor treatment for MRSA infections. Performed at [facility], [address] [place], [place] [address]     RADIOLOGY:  No results found.   Management plans discussed with the patient, family and they are in agreement.  CODE STATUS:     Code Status Orders  (From admission, onward)         Start     Ordered   10/22/18 2128  Full  code  Continuous     10/22/18 2127        Code Status History    Date Active Date Inactive Code Status Order ID Comments User Context   10/13/2018 0457 10/20/2018 1730 Full Code [id number]  Harrie Foreman, MD Inpatient      TOTAL TIME TAKING CARE OF THIS PATIENT: 38 minutes.    Gladstone Lighter M.D on 10/26/2018 at 10:25 AM  Between 7am to 6pm - Pager - [phone]  After 6pm go to [email or web address] - Proofreader  [facility]  Office  [phone]  CC: Primary care physician; [name], MD   Note: This dictation was prepared with Dragon dictation along with smaller phrase technology. Any transcriptional errors that result from this process are unintentional.

## 2018-10-26 NOTE — Plan of Care (Signed)
  Problem: Education: Goal: Knowledge of General Education information will improve Description Including pain rating scale, medication(s)/side effects and non-pharmacologic comfort measures Outcome: Progressing   Problem: Clinical Measurements: Goal: Diagnostic test results will improve Outcome: Progressing Goal: Respiratory complications will improve Outcome: Progressing   Problem: Safety: Goal: Ability to remain free from injury will improve Outcome: Progressing   Problem: Clinical Measurements: Goal: Diagnostic test results will improve Outcome: Progressing

## 2018-10-26 NOTE — Progress Notes (Signed)
Nsg Discharge Note  Admit Date:  10/22/2018 Discharge date: 10/26/2018   Monica [name] to be D/C'd Skilled nursing facility per MD order.  AVS completed.  Copy for chart, and copy for patient signed, and dated.  Patient/caregiver able to verbalize understanding.  Discharge Medication: Allergies as of 10/26/2018   No Known Allergies     Medication List    STOP taking these medications   cefTRIAXone  IVPB Commonly known as:  ROCEPHIN   enoxaparin 40 MG/0.4ML injection Commonly known as:  LOVENOX   sodium chloride 0.9 % infusion   vancomycin  IVPB     TAKE these medications   acetaminophen 325 MG tablet Commonly known as:  TYLENOL Take 2 tablets (650 mg total) by mouth every 6 (six) hours as needed for mild pain (or Fever >/= 101).   amoxicillin-clavulanate 875-125 MG tablet Commonly known as:  Augmentin Take 1 tablet by mouth 2 (two) times daily for 14 days.   collagenase ointment Commonly known as:  SANTYL Apply topically daily.   docusate sodium 100 MG capsule Commonly known as:  COLACE Take 1 capsule (100 mg total) by mouth 2 (two) times daily.   doxycycline 100 MG tablet Commonly known as:  VIBRA-TABS Take 1 tablet (100 mg total) by mouth 2 (two) times daily for 14 days.   Ensure Max Protein Liqd Take 330 mLs (11 oz total) by mouth 2 (two) times daily between meals.   lamoTRIgine 25 MG tablet Commonly known as:  LAMICTAL Take 2 tablets (50 mg total) by mouth at bedtime.   metFORMIN 500 MG tablet Commonly known as:  GLUCOPHAGE Take 1 tablet (500 mg total) by mouth 2 (two) times daily with a meal.   metoprolol succinate 50 MG 24 hr tablet Commonly known as:  TOPROL-XL Take 1 tablet (50 mg total) by mouth daily. Take with or immediately following a meal.   multivitamin-lutein Caps capsule Take 1 capsule by mouth daily.   oxyCODONE 5 MG immediate release tablet Commonly known as:  Oxy IR/ROXICODONE Take 1 tablet (5 mg total) by mouth every 6 (six) hours as  needed for moderate pain or severe pain.   Probiotic Daily Caps Take 1 capsule by mouth daily.   sertraline 50 MG tablet Commonly known as:  ZOLOFT Take 1 tablet (50 mg total) by mouth daily.       Discharge Assessment: Vitals:   10/26/18 0547 10/26/18 0748  BP: 140/75 (!) 147/75  Pulse: 80 78  Resp: 19   Temp: 98.5 F (36.9 C) 97.7 F (36.5 C)  SpO2: 99% 98%   Skin clean, dry and intact without evidence of skin break down, no evidence of skin tears noted. IV catheter discontinued intact. Site without signs and symptoms of complications - no redness or edema noted at insertion site, patient denies c/o pain - only slight tenderness at site.  Dressing with slight pressure applied.  D/c Instructions-Education: Discharge instructions given to patient/family with verbalized understanding. D/c education completed with patient/family including follow up instructions, medication list, d/c activities limitations if indicated, with other d/c instructions as indicated by MD - patient able to verbalize understanding, all questions fully answered. Patient instructed to return to ED, call 911, or call MD for any changes in condition.  Patient tx via ambulance. Report called to facility. Questions asked and answered. AVS placed in discharge packet for EMS.  Eda Keys, RN 10/26/2018 1:36 PM

## 2018-10-26 NOTE — Care Management Important Message (Signed)
Important Message  Patient Details  Name: Monica [name] MRN: [id number] Date of Birth: August 25, 1929   Medicare Important Message Given:  Yes    Dannette Barbara 10/26/2018, 11:39 AM

## 2018-10-26 NOTE — TOC Transition Note (Signed)
Transition of Care [facility]) - CM/SW Discharge Note   Patient Details  Name: AZALEA CEDAR MRN: [id number] Date of Birth: 1929/07/23  Transition of Care [facility]) CM/SW Contact:  Elza Rafter, RN Phone Number: 10/26/2018, 11:59 AM   Clinical Narrative:   Damaris Schooner with Claiborne Billings from [facility] and they are ready to accept patient at this time.  Called and spoke with daughter [name] [phone].  RN to call report to [phone].  Patient will go to room 36A.    Final next level of care: [place] Barriers to Discharge: No Barriers Identified   Patient Goals and CMS Choice Patient states their goals for this hospitalization and ongoing recovery are:: Patient plans to go to SNF to continue with her therapy so she can return back home. CMS Medicare.gov Compare Post Acute Care list provided to:: Patient Choice offered to / list presented to : Patient  Discharge Placement                       Discharge Plan and Services In-house Referral: Clinical Social Work Discharge Planning Services: CM Consult Post Acute Care Choice: Skilled Nursing Facility                               Social Determinants of Health (SDOH) Interventions     Readmission Risk Interventions Readmission Risk Prevention Plan 10/24/2018 10/20/2018  Transportation Screening Complete Complete  PCP or Specialist Appt within 3-5 Days Complete Not Complete  Not Complete comments Patient will be going to SNF Pt going to SNF  [place] or Home Care Consult Not Complete Not Complete  HRI or Home Care Consult comments Patient going to SNF Pt going to SNF  Social Work Consult for [place] Planning/Counseling Complete Complete  Palliative Care Screening Complete Complete  Medication Review Press photographer) Complete Complete  Some recent data might be hidden

## 2018-10-26 NOTE — Progress Notes (Signed)
[place] at [place] NAME: Monica [name]    MR#:  [id number]  DATE OF BIRTH:  1930/06/04  SUBJECTIVE:  CHIEF COMPLAINT:   Chief Complaint  Patient presents with  . Tachycardia   -Patient denies any complaints at this time.  Has generalized weakness - possible discharge to rehab today  REVIEW OF SYSTEMS:  Review of Systems  Constitutional: Positive for malaise/fatigue. Negative for chills and fever.  HENT: Positive for hearing loss. Negative for congestion, ear discharge and nosebleeds.   Eyes: Negative for blurred vision and double vision.  Respiratory: Negative for cough, shortness of breath and wheezing.   Cardiovascular: Negative for chest pain and palpitations.  Gastrointestinal: Negative for abdominal pain, constipation, diarrhea, nausea and vomiting.  Genitourinary: Negative for dysuria.  Musculoskeletal: Positive for myalgias.  Neurological: Negative for dizziness, focal weakness, seizures, weakness and headaches.  Psychiatric/Behavioral: Negative for depression.    DRUG ALLERGIES:  No Known Allergies  VITALS:  Blood pressure (!) 147/75, pulse 78, temperature 97.7 F (36.5 C), temperature source Oral, resp. rate 19, height 5\' 7"  (1.702 m), weight 75.7 kg, SpO2 98 %.  PHYSICAL EXAMINATION:  Physical Exam   GENERAL:  85 y.o.-year-old elderly patient lying in the bed with no acute distress.  EYES: Pupils equal, round, reactive to light and accommodation. No scleral icterus. Extraocular muscles intact.  HEENT: Head atraumatic, normocephalic. Oropharynx and nasopharynx clear.  NECK:  Supple, no jugular venous distention. No thyroid enlargement, no tenderness.  LUNGS: Normal breath sounds bilaterally, no wheezing, rales,rhonchi or crepitation. No use of accessory muscles of respiration. Decreased bibasilar breath sounds CARDIOVASCULAR: S1, S2 normal. No  rubs, or gallops. 3/6 systolic murmur present ABDOMEN: Soft, nontender,  nondistended. Bowel sounds present. No organomegaly or mass.  EXTREMITIES: No pedal edema, cyanosis, or clubbing. Wound vac from sacral wound Both heels in heel boots NEUROLOGIC: Cranial nerves II through XII are intact. Muscle strength 5/5 in both upper extremities and 3/5 in both lower extremities. Sensation intact. Gait not checked. Global weakness noted PSYCHIATRIC: The patient is alert and oriented x 3.  SKIN: sacral decub ulcer- dressing change done- looks good.      LABORATORY PANEL:   CBC Recent Labs  Lab 10/25/18 0501  WBC 7.1  HGB 8.3*  HCT 26.9*  PLT 347   ------------------------------------------------------------------------------------------------------------------  Chemistries  Recent Labs  Lab 10/24/18 0442 10/25/18 0501 10/25/18 1307  NA 139  --   --   K 3.6  --   --   CL 108  --   --   CO2 24  --   --   GLUCOSE 143*  --  310*  BUN 23  --   --   CREATININE 1.10* 1.13*  --   CALCIUM 8.1*  --   --    ------------------------------------------------------------------------------------------------------------------  Cardiac Enzymes Recent Labs  Lab 10/23/18 0855  TROPONINI 0.17*   ------------------------------------------------------------------------------------------------------------------  RADIOLOGY:  No results found.  EKG:   Orders placed or performed during the hospital encounter of 10/12/18  . EKG 12-Lead  . EKG 12-Lead    ASSESSMENT AND PLAN:   85 year old female with past medical history significant for COPD not on home oxygen, hypertension, recent admission for stage IV sacral decubitus ulcer with osteomyelitis status post surgery and discharged to rehab on 10/20/2018 IV antibiotics presents to hospital secondary to fever and tachycardia.  1.  Fever-initially admitted for possible sepsis, sepsis has been ruled out.  Blood cultures are negative.  Sacral decub ulcer looks good according to surgery. - fevers resolved now -  COVID  19 negative - wbc is normal  2.  SVT-monitor on telemetry, heart rates have improved.  Elevated troponin is secondary to demand ischemia from tachycardia. -Echocardiogram EF and no wall motion changes.  3.  Sacral osteomyelitis with a healed sacral decub ulcer-status post debridement last admission on 10/16/18- has a wound vac on-discharged to rehab on IV vancomycin and Rocephin. -Receiving IV antibiotics in the hospital here.   -She will plan was to continue IV antibiotics until 11/02/2018 and then changed to oral doxycycline and ciprofloxacin for 2 more weeks. - appreciate ID consult,  continue wound vac with changes every M-W-F -Await further recommendations from ID at this point.  Has a PICC line  4. HTN- on metoprolol   5. DVT prophylaxis- Lovenox  6.  Acute on chronic anemia-Baseline hemoglobin seems to be around 13, dropped to 10 during recent admission after surgery and at discharge. -Hemoglobin further dropped to 8.4 this adm and stable now.  Concern for dilution.    - No acute indication for transfusion at this time. - anemia labs with low iron-received a dose of IV iron this admission  Possible discharge to [facility] today.   All the records are reviewed and case discussed with Care Management/Social Workerr. Management plans discussed with the patient, family and they are in agreement.  CODE STATUS: Full Code  TOTAL TIME TAKING CARE OF THIS PATIENT: 36 minutes.   POSSIBLE D/C TODAY, DEPENDING ON CLINICAL CONDITION.   Gladstone Lighter M.D on 10/26/2018 at 9:51 AM  Between 7am to 6pm - Pager - [phone]  After 6pm go to [email or web address] - password [email or web address] [place] [facility]  Office  [phone]  CC: Primary care physician; [name], MD

## 2018-10-26 NOTE — Telephone Encounter (Signed)
HFU/Dx Sepsis/Pt is being discharged today from [facility].  Appt is sch for 05/14 @ 11. Thank you!

## 2018-10-26 NOTE — Progress Notes (Signed)
Ems here to pick up pt. Wound vac clamped per request of receiving facility.

## 2018-10-26 NOTE — TOC Progression Note (Signed)
Transition of Care [facility]) - Progression Note    Patient Details  Name: Monica [name] MRN: [id number] Date of Birth: 1929/08/11  Transition of Care [facility]) CM/SW Contact  Elza Rafter, RN Phone Number: 10/26/2018, 9:55 AM  Clinical Narrative:   Damaris Schooner with Claiborne Billings from [facility] this morning.  Reported COVID negative.  She states she will be able to take patient today and will call me shortly with room and report number.  Will need ID consult prior to discharge today.    Geoffry Paradise, BSN, RN Care Manager [phone]     Expected Discharge Plan: Skilled Nursing Facility Barriers to Discharge: Continued Medical Work up  Expected Discharge Plan and Services Expected Discharge Plan: [place] In-house Referral: Clinical Social Work Discharge Planning Services: CM Consult Post Acute Care Choice: [place] Living arrangements for the past 2 months: Single Family Home Expected Discharge Date: 10/22/18                                     Social Determinants of Health (SDOH) Interventions    Readmission Risk Interventions Readmission Risk Prevention Plan 10/24/2018 10/20/2018  Transportation Screening Complete Complete  PCP or Specialist Appt within 3-5 Days Complete Not Complete  Not Complete comments Patient will be going to SNF Pt going to SNF  [place] or [place] Not Complete Not Complete  HRI or Home Care Consult comments Patient going to SNF Pt going to SNF  Social Work Consult for [place] Planning/Counseling Complete Complete  Palliative Care Screening Complete Complete  Medication Review Press photographer) Complete Complete  Some recent data might be hidden

## 2018-10-27 DIAGNOSIS — N183 Chronic kidney disease, stage 3 (moderate): Secondary | ICD-10-CM | POA: Diagnosis not present

## 2018-10-27 DIAGNOSIS — R5381 Other malaise: Secondary | ICD-10-CM | POA: Diagnosis not present

## 2018-10-27 DIAGNOSIS — J449 Chronic obstructive pulmonary disease, unspecified: Secondary | ICD-10-CM | POA: Diagnosis not present

## 2018-10-27 DIAGNOSIS — I471 Supraventricular tachycardia: Secondary | ICD-10-CM | POA: Diagnosis not present

## 2018-10-27 LAB — CULTURE, BLOOD (ROUTINE X 2)
Culture: NO GROWTH
Culture: NO GROWTH

## 2018-10-27 NOTE — Telephone Encounter (Signed)
Patient was DC skilled nursing facility and patients are not allowed out for HFU and the only visit can be done is a virtual visit through [place] I will try to coordinate.

## 2018-10-27 NOTE — Telephone Encounter (Addendum)
Patient is in SNF  At [facility] health care center patient will have follow up after discharge not eligible for TCM until DC. Appointment canceled.

## 2018-10-28 LAB — HEMOGLOBIN A1C
Hgb A1c MFr Bld: 6.6 % — ABNORMAL HIGH (ref 4.8–5.6)
Mean Plasma Glucose: 142.72 mg/dL

## 2018-10-29 DIAGNOSIS — E119 Type 2 diabetes mellitus without complications: Secondary | ICD-10-CM | POA: Diagnosis not present

## 2018-10-29 DIAGNOSIS — N183 Chronic kidney disease, stage 3 (moderate): Secondary | ICD-10-CM | POA: Diagnosis not present

## 2018-10-29 DIAGNOSIS — L8994 Pressure ulcer of unspecified site, stage 4: Secondary | ICD-10-CM | POA: Diagnosis not present

## 2018-11-05 ENCOUNTER — Ambulatory Visit: Payer: Medicare Other | Admitting: Internal Medicine

## 2018-11-10 ENCOUNTER — Inpatient Hospital Stay: Payer: Medicare Other | Admitting: Infectious Diseases

## 2018-11-11 ENCOUNTER — Other Ambulatory Visit: Payer: Self-pay | Admitting: Internal Medicine

## 2018-11-13 ENCOUNTER — Inpatient Hospital Stay
Admission: EM | Admit: 2018-11-13 | Discharge: 2018-11-18 | DRG: 871 | Disposition: A | Discharge status: Hospice | Payer: Medicare Other | Source: Skilled Nursing Facility | Attending: Internal Medicine | Admitting: Internal Medicine

## 2018-11-13 ENCOUNTER — Emergency Department: Payer: Medicare Other

## 2018-11-13 ENCOUNTER — Other Ambulatory Visit: Payer: Self-pay

## 2018-11-13 DIAGNOSIS — A419 Sepsis, unspecified organism: Principal | ICD-10-CM | POA: Diagnosis present

## 2018-11-13 DIAGNOSIS — I959 Hypotension, unspecified: Secondary | ICD-10-CM | POA: Diagnosis not present

## 2018-11-13 DIAGNOSIS — R6521 Severe sepsis with septic shock: Secondary | ICD-10-CM | POA: Diagnosis present

## 2018-11-13 DIAGNOSIS — N179 Acute kidney failure, unspecified: Secondary | ICD-10-CM | POA: Diagnosis not present

## 2018-11-13 DIAGNOSIS — Z7189 Other specified counseling: Secondary | ICD-10-CM | POA: Diagnosis not present

## 2018-11-13 DIAGNOSIS — R627 Adult failure to thrive: Secondary | ICD-10-CM | POA: Diagnosis not present

## 2018-11-13 DIAGNOSIS — M4628 Osteomyelitis of vertebra, sacral and sacrococcygeal region: Secondary | ICD-10-CM | POA: Diagnosis not present

## 2018-11-13 DIAGNOSIS — I491 Atrial premature depolarization: Secondary | ICD-10-CM | POA: Diagnosis not present

## 2018-11-13 DIAGNOSIS — F329 Major depressive disorder, single episode, unspecified: Secondary | ICD-10-CM | POA: Diagnosis present

## 2018-11-13 DIAGNOSIS — Z6824 Body mass index (BMI) 24.0-24.9, adult: Secondary | ICD-10-CM

## 2018-11-13 DIAGNOSIS — Z8249 Family history of ischemic heart disease and other diseases of the circulatory system: Secondary | ICD-10-CM

## 2018-11-13 DIAGNOSIS — Z7984 Long term (current) use of oral hypoglycemic drugs: Secondary | ICD-10-CM

## 2018-11-13 DIAGNOSIS — Z66 Do not resuscitate: Secondary | ICD-10-CM | POA: Diagnosis not present

## 2018-11-13 DIAGNOSIS — E785 Hyperlipidemia, unspecified: Secondary | ICD-10-CM | POA: Diagnosis present

## 2018-11-13 DIAGNOSIS — E43 Unspecified severe protein-calorie malnutrition: Secondary | ICD-10-CM | POA: Diagnosis not present

## 2018-11-13 DIAGNOSIS — E1169 Type 2 diabetes mellitus with other specified complication: Secondary | ICD-10-CM | POA: Diagnosis not present

## 2018-11-13 DIAGNOSIS — Z833 Family history of diabetes mellitus: Secondary | ICD-10-CM

## 2018-11-13 DIAGNOSIS — Z515 Encounter for palliative care: Secondary | ICD-10-CM | POA: Diagnosis not present

## 2018-11-13 DIAGNOSIS — Z7401 Bed confinement status: Secondary | ICD-10-CM | POA: Diagnosis not present

## 2018-11-13 DIAGNOSIS — J449 Chronic obstructive pulmonary disease, unspecified: Secondary | ICD-10-CM | POA: Diagnosis not present

## 2018-11-13 DIAGNOSIS — Z20828 Contact with and (suspected) exposure to other viral communicable diseases: Secondary | ICD-10-CM | POA: Diagnosis present

## 2018-11-13 DIAGNOSIS — L8994 Pressure ulcer of unspecified site, stage 4: Secondary | ICD-10-CM | POA: Diagnosis not present

## 2018-11-13 DIAGNOSIS — L89154 Pressure ulcer of sacral region, stage 4: Secondary | ICD-10-CM | POA: Diagnosis present

## 2018-11-13 DIAGNOSIS — E119 Type 2 diabetes mellitus without complications: Secondary | ICD-10-CM | POA: Diagnosis not present

## 2018-11-13 DIAGNOSIS — R404 Transient alteration of awareness: Secondary | ICD-10-CM | POA: Diagnosis not present

## 2018-11-13 DIAGNOSIS — I10 Essential (primary) hypertension: Secondary | ICD-10-CM | POA: Diagnosis not present

## 2018-11-13 DIAGNOSIS — E860 Dehydration: Secondary | ICD-10-CM | POA: Diagnosis not present

## 2018-11-13 DIAGNOSIS — R680 Hypothermia, not associated with low environmental temperature: Secondary | ICD-10-CM | POA: Diagnosis present

## 2018-11-13 DIAGNOSIS — L98423 Non-pressure chronic ulcer of back with necrosis of muscle: Secondary | ICD-10-CM | POA: Diagnosis not present

## 2018-11-13 DIAGNOSIS — R410 Disorientation, unspecified: Secondary | ICD-10-CM | POA: Diagnosis not present

## 2018-11-13 DIAGNOSIS — R279 Unspecified lack of coordination: Secondary | ICD-10-CM | POA: Diagnosis not present

## 2018-11-13 DIAGNOSIS — Z87891 Personal history of nicotine dependence: Secondary | ICD-10-CM | POA: Diagnosis not present

## 2018-11-13 DIAGNOSIS — Z743 Need for continuous supervision: Secondary | ICD-10-CM | POA: Diagnosis not present

## 2018-11-13 DIAGNOSIS — R531 Weakness: Secondary | ICD-10-CM | POA: Diagnosis not present

## 2018-11-13 DIAGNOSIS — I4891 Unspecified atrial fibrillation: Secondary | ICD-10-CM | POA: Diagnosis not present

## 2018-11-13 LAB — COMPREHENSIVE METABOLIC PANEL
ALT: 43 U/L (ref 0–44)
AST: 30 U/L (ref 15–41)
Albumin: 2 g/dL — ABNORMAL LOW (ref 3.5–5.0)
Alkaline Phosphatase: 158 U/L — ABNORMAL HIGH (ref 38–126)
Anion gap: 14 (ref 5–15)
BUN: 95 mg/dL — ABNORMAL HIGH (ref 8–23)
CO2: 17 mmol/L — ABNORMAL LOW (ref 22–32)
Calcium: 8.4 mg/dL — ABNORMAL LOW (ref 8.9–10.3)
Chloride: 109 mmol/L (ref 98–111)
Creatinine, Ser: 2.99 mg/dL — ABNORMAL HIGH (ref 0.44–1.00)
GFR calc Af Amer: 16 mL/min — ABNORMAL LOW (ref >60)
GFR calc non Af Amer: 14 mL/min — ABNORMAL LOW (ref >60)
Glucose, Bld: 186 mg/dL — ABNORMAL HIGH (ref 70–99)
Potassium: 5 mmol/L (ref 3.5–5.1)
Sodium: 140 mmol/L (ref 135–145)
Total Bilirubin: 0.4 mg/dL (ref 0.3–1.2)
Total Protein: 6 g/dL — ABNORMAL LOW (ref 6.5–8.1)

## 2018-11-13 LAB — CBC WITH DIFFERENTIAL/PLATELET
Abs Immature Granulocytes: 1.1 10*3/uL — ABNORMAL HIGH (ref 0.00–0.07)
Basophils Absolute: 0.1 10*3/uL (ref 0.0–0.1)
Basophils Relative: 0 %
Eosinophils Absolute: 0 10*3/uL (ref 0.0–0.5)
Eosinophils Relative: 0 %
HCT: 38 % (ref 36.0–46.0)
Hemoglobin: 11.8 g/dL — ABNORMAL LOW (ref 12.0–15.0)
Immature Granulocytes: 3 %
Lymphocytes Relative: 6 %
Lymphs Abs: 2 10*3/uL (ref 0.7–4.0)
MCH: 27.2 pg (ref 26.0–34.0)
MCHC: 31.1 g/dL (ref 30.0–36.0)
MCV: 87.6 fL (ref 80.0–100.0)
Monocytes Absolute: 1.2 10*3/uL — ABNORMAL HIGH (ref 0.1–1.0)
Monocytes Relative: 4 %
Neutro Abs: 28.1 10*3/uL — ABNORMAL HIGH (ref 1.7–7.7)
Neutrophils Relative %: 87 %
Platelets: 337 10*3/uL (ref 150–400)
RBC: 4.34 MIL/uL (ref 3.87–5.11)
RDW: 18.7 % — ABNORMAL HIGH (ref 11.5–15.5)
Smear Review: NORMAL
WBC: 32.5 10*3/uL — ABNORMAL HIGH (ref 4.0–10.5)
nRBC: 0.1 % (ref 0.0–0.2)

## 2018-11-13 LAB — GLUCOSE, CAPILLARY
Glucose-Capillary: 158 mg/dL — ABNORMAL HIGH (ref 70–99)
Glucose-Capillary: 100 mg/dL — ABNORMAL HIGH (ref 70–99)

## 2018-11-13 LAB — LACTIC ACID, PLASMA
Lactic Acid, Venous: 3.1 mmol/L (ref 0.5–1.9)
Lactic Acid, Venous: 1.8 mmol/L (ref 0.5–1.9)

## 2018-11-13 LAB — SARS CORONAVIRUS 2 BY RT PCR (HOSPITAL ORDER, PERFORMED IN ~~LOC~~ HOSPITAL LAB): SARS Coronavirus 2: NEGATIVE

## 2018-11-13 MED ORDER — OXYCODONE HCL 5 MG PO TABS
5.0000 mg | ORAL_TABLET | Freq: Four times a day (QID) | ORAL | Status: DC | PRN
  Administered 2018-11-16 – 2018-11-18 (×3): 5 mg via ORAL
  Filled 2018-11-13 (×3): qty 1

## 2018-11-13 MED ORDER — INSULIN ASPART 100 UNIT/ML ~~LOC~~ SOLN
0.0000 [IU] | Freq: Three times a day (TID) | SUBCUTANEOUS | Status: DC
  Administered 2018-11-13: 2 [IU] via SUBCUTANEOUS
  Administered 2018-11-14 – 2018-11-17 (×6): 1 [IU] via SUBCUTANEOUS
  Filled 2018-11-13 (×7): qty 1

## 2018-11-13 MED ORDER — ACETAMINOPHEN 325 MG PO TABS: 650.0000 mg | ORAL_TABLET | Freq: Four times a day (QID) | ORAL | Status: DC | PRN

## 2018-11-13 MED ORDER — ENOXAPARIN SODIUM 40 MG/0.4ML ~~LOC~~ SOLN: 40.0000 mg | SUBCUTANEOUS | Status: DC

## 2018-11-13 MED ORDER — VANCOMYCIN HCL IN DEXTROSE 750-5 MG/150ML-% IV SOLN
750.0000 mg | Freq: Once | INTRAVENOUS | Status: AC
  Administered 2018-11-13: 16:00:00 750 mg via INTRAVENOUS
  Filled 2018-11-13: qty 150

## 2018-11-13 MED ORDER — SODIUM CHLORIDE 0.9 % IV BOLUS (SEPSIS)
1000.0000 mL | Freq: Once | INTRAVENOUS | Status: AC
  Administered 2018-11-13: 1000 mL via INTRAVENOUS

## 2018-11-13 MED ORDER — SODIUM CHLORIDE 0.9 % IV BOLUS (SEPSIS)
250.0000 mL | Freq: Once | INTRAVENOUS | Status: AC
  Administered 2018-11-13: 15:00:00 250 mL via INTRAVENOUS

## 2018-11-13 MED ORDER — COLLAGENASE 250 UNIT/GM EX OINT
TOPICAL_OINTMENT | Freq: Every day | CUTANEOUS | Status: DC
  Administered 2018-11-14: via TOPICAL
  Filled 2018-11-13: qty 30

## 2018-11-13 MED ORDER — DOCUSATE SODIUM 100 MG PO CAPS
100.0000 mg | ORAL_CAPSULE | Freq: Two times a day (BID) | ORAL | Status: DC
  Administered 2018-11-14 – 2018-11-18 (×6): 100 mg via ORAL
  Filled 2018-11-13 (×9): qty 1

## 2018-11-13 MED ORDER — SODIUM CHLORIDE 0.9 % IV SOLN
INTRAVENOUS | Status: DC
  Administered 2018-11-13 – 2018-11-14 (×4): via INTRAVENOUS

## 2018-11-13 MED ORDER — SERTRALINE HCL 50 MG PO TABS
50.0000 mg | ORAL_TABLET | Freq: Every day | ORAL | Status: DC
  Administered 2018-11-13 – 2018-11-18 (×5): 50 mg via ORAL
  Filled 2018-11-13 (×6): qty 1

## 2018-11-13 MED ORDER — PIPERACILLIN-TAZOBACTAM 3.375 G IVPB 30 MIN
3.3750 g | Freq: Once | INTRAVENOUS | Status: AC
  Administered 2018-11-13: 13:00:00 3.375 g via INTRAVENOUS

## 2018-11-13 MED ORDER — ADULT MULTIVITAMIN W/MINERALS CH
1.0000 | ORAL_TABLET | Freq: Every day | ORAL | Status: DC
  Administered 2018-11-15 – 2018-11-18 (×4): 1 via ORAL
  Filled 2018-11-13 (×5): qty 1

## 2018-11-13 MED ORDER — INSULIN ASPART 100 UNIT/ML ~~LOC~~ SOLN: 0.0000 [IU] | Freq: Every day | SUBCUTANEOUS | Status: DC

## 2018-11-13 MED ORDER — ENSURE MAX PROTEIN PO LIQD
11.0000 [oz_av] | Freq: Two times a day (BID) | ORAL | Status: DC
  Administered 2018-11-14 – 2018-11-18 (×9): 11 [oz_av] via ORAL
  Filled 2018-11-13: qty 330

## 2018-11-13 MED ORDER — HEPARIN SODIUM (PORCINE) 5000 UNIT/ML IJ SOLN
5000.0000 [IU] | Freq: Three times a day (TID) | INTRAMUSCULAR | Status: DC
  Administered 2018-11-13 – 2018-11-18 (×16): 5000 [IU] via SUBCUTANEOUS
  Filled 2018-11-13 (×16): qty 1

## 2018-11-13 MED ORDER — VANCOMYCIN HCL IN DEXTROSE 1-5 GM/200ML-% IV SOLN
1000.0000 mg | Freq: Once | INTRAVENOUS | Status: AC
  Administered 2018-11-13: 1000 mg via INTRAVENOUS

## 2018-11-13 MED ORDER — METOPROLOL SUCCINATE ER 50 MG PO TB24
50.0000 mg | ORAL_TABLET | Freq: Every day | ORAL | Status: DC
  Administered 2018-11-15 – 2018-11-18 (×4): 50 mg via ORAL
  Filled 2018-11-13 (×4): qty 1

## 2018-11-13 MED ORDER — HEPARIN SODIUM (PORCINE) 5000 UNIT/ML IJ SOLN: 5000.0000 [IU] | Freq: Three times a day (TID) | INTRAMUSCULAR | Status: DC

## 2018-11-13 MED ORDER — LAMOTRIGINE 25 MG PO TABS
50.0000 mg | ORAL_TABLET | Freq: Every day | ORAL | Status: DC
  Administered 2018-11-14 – 2018-11-17 (×4): 50 mg via ORAL
  Filled 2018-11-13 (×5): qty 2

## 2018-11-13 MED ORDER — RISAQUAD PO CAPS
1.0000 | ORAL_CAPSULE | Freq: Every day | ORAL | Status: DC
  Administered 2018-11-14 – 2018-11-18 (×5): 1 via ORAL
  Filled 2018-11-13 (×5): qty 1

## 2018-11-13 MED ORDER — SODIUM CHLORIDE 0.9 % IV SOLN
2.0000 g | INTRAVENOUS | Status: DC
  Administered 2018-11-13: 16:00:00 2 g via INTRAVENOUS
  Filled 2018-11-13 (×2): qty 2

## 2018-11-13 MED ORDER — VANCOMYCIN HCL 10 G IV SOLR: 1000.0000 mg | Freq: Once | INTRAVENOUS | Status: DC

## 2018-11-13 NOTE — ED Notes (Signed)
ED TO INPATIENT HANDOFF REPORT  ED Nurse Name and Phone #:   S Name/Age/Gender Monica [name] 85 y.o. female Room/Bed: ED04A/ED04A  Code Status   Code Status: Full Code  Home/SNF/Other Nursing Home Patient oriented to: self, place and situation Is this baseline? Yes   Triage Complete: Triage complete  Chief Complaint sepsis  Triage Note Patient sent from NH with c/o low temperature, and abnormal labs. Patient reports pain to buttock area, and positive nausea.       Allergies No Known Allergies  Level of Care/Admitting Diagnosis ED Disposition    ED Disposition Condition [facility] Area: [place] [[id number]]  Level of Care: Med-Surg [16]  Covid Evaluation: Screening Protocol (No Symptoms)  Diagnosis: Sepsis [facility]) [[id number]]  Admitting Physician: Otila Back [place]  Attending Physician: Otila Back [[id number]]  Estimated length of stay: past midnight tomorrow  Certification:: I certify this patient will need inpatient services for at least 2 midnights  PT Class (Do Not Modify): Inpatient [101]  PT Acc Code (Do Not Modify): Private [1]       B Medical/Surgery History Past Medical History:  Diagnosis Date  . B12 deficiency   . COPD (chronic obstructive pulmonary disease) ([place])   . Diabetes mellitus   . H/O: Bell's palsy   . Hyperlipidemia   . Hypertension   . Lung mass April 2011   s/p biopsy, no cancer cells found, Repeat CT Dec 2012 unchanged  . Osteomyelitis [facility])    Past Surgical History:  Procedure Laterality Date  . ABDOMINAL HYSTERECTOMY    . APPLICATION OF WOUND VAC N/A 10/16/2018   Procedure: APPLICATION OF WOUND VAC;  Surgeon: [name], MD;  Location: [facility] ORS;  Service: General;  Laterality: N/A;  . CHOLECYSTECTOMY    . INCISION AND DRAINAGE OF WOUND N/A 10/16/2018   Procedure: IRRIGATION AND DEBRIDEMENT WOUND;  Surgeon: [name], MD;  Location: [facility] ORS;  Service: General;  Laterality: N/A;  . LUNG  BIOPSY  2012   for positive PET, negative biopsy for malignancy  . SPINE SURGERY  2001   Lumbar     A IV Location/Drains/Wounds Patient Lines/Drains/Airways Status   Active Line/Drains/Airways    Name:   Placement date:   Placement time:   Site:   Days:   Peripheral IV 11/13/18   11/13/18    1150    -   less than 1   Peripheral IV 11/13/18   11/13/18    1207    -   less than 1   Peripheral IV 11/13/18 Right Forearm   11/13/18    1217    Forearm   less than 1   Negative Pressure Wound Therapy Sacrum Mid;Left   10/23/18    0304    -   21   External Urinary Catheter   10/22/18    2230    -   22   Incision (Closed) 10/16/18 Coccyx   10/16/18    0907     28   Pressure Injury 10/13/18 Stage IV - Full thickness tissue loss with exposed bone, tendon or muscle. bone visible   10/13/18    0445     31   Pressure Injury 10/13/18 Stage I -  Intact skin with non-blanchable redness of a localized area usually over a bony prominence. small area of blanchable redness   10/13/18    0445     31   Pressure Injury 10/13/18 Stage  I -  Intact skin with non-blanchable redness of a localized area usually over a bony prominence. area of blanchable redness   10/13/18    0445     31   Pressure Injury 10/23/18 Stage IV - Full thickness tissue loss with exposed bone, tendon or muscle. deep cavenous pressure ulcer with full thickness tissue lost in the vase of the ulcer covered with slough that is yellow tangreen gray and brown in the wo   10/23/18    0501     21          Intake/Output Last 24 hours  Intake/Output Summary (Last 24 hours) at 11/13/2018 1703 Last data filed at 11/13/2018 1629 Gross per 24 hour  Intake 2450 ml  Output -  Net 2450 ml    Labs/Imaging Results for orders placed or performed during the hospital encounter of 11/13/18 (from the past 48 hour(s))  Comprehensive metabolic panel     Status: Abnormal   Collection Time: 11/13/18 12:41 PM  Result Value Ref Range   Sodium 140 135 - 145 mmol/L    Potassium 5.0 3.5 - 5.1 mmol/L   Chloride 109 98 - 111 mmol/L   CO2 17 (L) 22 - 32 mmol/L   Glucose, Bld 186 (H) 70 - 99 mg/dL   BUN 95 (H) 8 - 23 mg/dL   Creatinine, Ser 2.99 (H) 0.44 - 1.00 mg/dL   Calcium 8.4 (L) 8.9 - 10.3 mg/dL   Total Protein 6.0 (L) 6.5 - 8.1 g/dL   Albumin 2.0 (L) 3.5 - 5.0 g/dL   AST 30 15 - 41 U/L   ALT 43 0 - 44 U/L   Alkaline Phosphatase 158 (H) 38 - 126 U/L   Total Bilirubin 0.4 0.3 - 1.2 mg/dL   GFR calc non Af Amer 14 (L) >60 mL/min   GFR calc Af Amer 16 (L) >60 mL/min   Anion gap 14 5 - 15    Comment: Performed at [facility], [address] [place], [place] [address]  CBC WITH DIFFERENTIAL     Status: Abnormal   Collection Time: 11/13/18 12:41 PM  Result Value Ref Range   WBC 32.5 (H) 4.0 - 10.5 K/uL   RBC 4.34 3.87 - 5.11 MIL/uL   Hemoglobin 11.8 (L) 12.0 - 15.0 g/dL   HCT 38.0 36.0 - 46.0 %   MCV 87.6 80.0 - 100.0 fL   MCH 27.2 26.0 - 34.0 pg   MCHC 31.1 30.0 - 36.0 g/dL   RDW 18.7 (H) 11.5 - 15.5 %   Platelets 337 150 - 400 K/uL   nRBC 0.1 0.0 - 0.2 %   Neutrophils Relative % 87 %   Neutro Abs 28.1 (H) 1.7 - 7.7 K/uL   Lymphocytes Relative 6 %   Lymphs Abs 2.0 0.7 - 4.0 K/uL   Monocytes Relative 4 %   Monocytes Absolute 1.2 (H) 0.1 - 1.0 K/uL   Eosinophils Relative 0 %   Eosinophils Absolute 0.0 0.0 - 0.5 K/uL   Basophils Relative 0 %   Basophils Absolute 0.1 0.0 - 0.1 K/uL   WBC Morphology MORPHOLOGY UNREMARKABLE    RBC Morphology MORPHOLOGY UNREMARKABLE    Smear Review Normal platelet morphology    Immature Granulocytes 3 %   Abs Immature Granulocytes 1.10 (H) 0.00 - 0.07 K/uL    Comment: Performed at [facility], [address] [place], [place] [address]  Lactic acid, plasma     Status: Abnormal   Collection Time:  11/13/18 12:42 PM  Result Value Ref Range   Lactic Acid, Venous 3.1 (HH) 0.5 - 1.9 mmol/L    Comment: CRITICAL RESULT CALLED TO, READ BACK BY AND VERIFIED WITH JEANETTE RAMOS PEREZ AT 3716  ON 11/13/18 KLM Performed at [facility], [address] [place], [place] [address]   SARS Coronavirus 2 (CEPHEID - Performed in [facility] hospital lab), Hosp Order     Status: None   Collection Time: 11/13/18 12:42 PM  Result Value Ref Range   SARS Coronavirus 2 NEGATIVE NEGATIVE    Comment: (NOTE) If result is NEGATIVE SARS-CoV-2 target nucleic acids are NOT DETECTED. The SARS-CoV-2 RNA is generally detectable in upper and lower  respiratory specimens during the acute phase of infection. The lowest  concentration of SARS-CoV-2 viral copies this assay can detect is 250  copies / mL. A negative result does not preclude SARS-CoV-2 infection  and should not be used as the sole basis for treatment or other  patient management decisions.  A negative result may occur with  improper specimen collection / handling, submission of specimen other  than nasopharyngeal swab, presence of viral mutation(s) within the  areas targeted by this assay, and inadequate number of viral copies  (<250 copies / mL). A negative result must be combined with clinical  observations, patient history, and epidemiological information. If result is POSITIVE SARS-CoV-2 target nucleic acids are DETECTED. The SARS-CoV-2 RNA is generally detectable in upper and lower  respiratory specimens dur ing the acute phase of infection.  Positive  results are indicative of active infection with SARS-CoV-2.  Clinical  correlation with patient history and other diagnostic information is  necessary to determine patient infection status.  Positive results do  not rule out bacterial infection or co-infection with other viruses. If result is PRESUMPTIVE POSTIVE SARS-CoV-2 nucleic acids MAY BE PRESENT.   A presumptive positive result was obtained on the submitted specimen  and confirmed on repeat testing.  While 2019 novel coronavirus  (SARS-CoV-2) nucleic acids may be present in the submitted sample  additional  confirmatory testing may be necessary for epidemiological  and / or clinical management purposes  to differentiate between  SARS-CoV-2 and other Sarbecovirus currently known to infect humans.  If clinically indicated additional testing with an alternate test  methodology [phone]) is advised. The SARS-CoV-2 RNA is generally  detectable in upper and lower respiratory sp ecimens during the acute  phase of infection. The expected result is Negative. Fact Sheet for Patients:  StrictlyIdeas.no Fact Sheet for Healthcare Providers: BankingDealers.co.za This test is not yet approved or cleared by the [place] FDA and has been authorized for detection and/or diagnosis of SARS-CoV-2 by FDA under an Emergency Use Authorization (EUA).  This EUA will remain in effect (meaning this test can be used) for the duration of the COVID-19 declaration under Section 564(b)(1) of the Act, 21 U.S.C. section 360bbb-3(b)(1), unless the authorization is terminated or revoked sooner. Performed at [facility], [address] [place], [place] [address]   Lactic acid, plasma     Status: None   Collection Time: 11/13/18  3:45 PM  Result Value Ref Range   Lactic Acid, Venous 1.8 0.5 - 1.9 mmol/L    Comment: Performed at [facility], [address] [place], [place] [address]   Dg Chest Port 1 View  Result Date: 11/13/2018 CLINICAL DATA:  Nausea.  Sepsis. EXAM: PORTABLE CHEST 1 VIEW COMPARISON:  One-view chest x-ray 10/22/2018 FINDINGS: The heart size and  mediastinal contours are within normal limits. Both lungs are clear. The visualized skeletal structures are unremarkable. IMPRESSION: Negative one-view chest x-ray Electronically Signed   By: San Morelle M.D.   On: 11/13/2018 13:55    Pending Labs Unresulted Labs (From admission, onward)    Start     Ordered   11/14/18 1200  Vancomycin, random  Once-Timed,   STAT     11/13/18 1555    11/14/18 0500  Protime-INR  Tomorrow morning,   STAT     11/13/18 1531   11/14/18 0500  Cortisol-am, blood  Tomorrow morning,   STAT     11/13/18 1531   11/14/18 0500  Procalcitonin  Tomorrow morning,   STAT     11/13/18 1531   11/14/18 1062  Basic metabolic panel  Tomorrow morning,   STAT     11/13/18 1534   11/14/18 0500  CBC  Tomorrow morning,   STAT     11/13/18 1534   11/14/18 0500  Magnesium  Tomorrow morning,   STAT     11/13/18 1534   11/14/18 0500  Hemoglobin A1c  Tomorrow morning,   STAT    Comments:  To assess prior glycemic control    11/13/18 1534   11/13/18 1535  Urinalysis, Routine w reflex microscopic  Once,   STAT     11/13/18 1534   11/13/18 1230  Urine culture  ONCE - STAT,   STAT     11/13/18 1229   11/13/18 1211  Blood Culture (routine x 2)  BLOOD CULTURE X 2,   STAT     11/13/18 1210   11/13/18 1211  Urinalysis, Routine w reflex microscopic  ONCE - STAT,   STAT     11/13/18 1210          Vitals/Pain Today's Vitals   11/13/18 1600 11/13/18 1630 11/13/18 1645 11/13/18 1700  BP: (!) 103/57 (!) 108/56  (!) 107/57  Pulse:   65 67  Resp: 16 18 17 18   Temp:      TempSrc:      SpO2:   100% 100%  Weight:      Height:      PainSc:        Isolation Precautions No active isolations  Medications Medications  acetaminophen (TYLENOL) tablet 650 mg (has no administration in time range)  oxyCODONE (Oxy IR/ROXICODONE) immediate release tablet 5 mg (has no administration in time range)  metoprolol succinate (TOPROL-XL) 24 hr tablet 50 mg (has no administration in time range)  sertraline (ZOLOFT) tablet 50 mg (50 mg Oral Given 11/13/18 1550)  docusate sodium (COLACE) capsule 100 mg (has no administration in time range)  Probiotic Daily CAPS 1 capsule (has no administration in time range)  lamoTRIgine (LAMICTAL) tablet 50 mg (has no administration in time range)  protein supplement (ENSURE MAX) liquid (has no administration in time range)  multivitamin with  minerals tablet 1 tablet (has no administration in time range)  collagenase (SANTYL) ointment (has no administration in time range)  insulin aspart (novoLOG) injection 0-9 Units (has no administration in time range)  insulin aspart (novoLOG) injection 0-5 Units (has no administration in time range)  0.9 %  sodium chloride infusion ( Intravenous New Bag/Given 11/13/18 1550)  vancomycin (VANCOCIN) IVPB 750 mg/150 ml premix (750 mg Intravenous New Bag/Given 11/13/18 1611)  ceFEPIme (MAXIPIME) 2 g in sodium chloride 0.9 % 100 mL IVPB (0 g Intravenous Stopped 11/13/18 1629)  heparin injection 5,000 Units (has no administration in time  range)  piperacillin-tazobactam (ZOSYN) IVPB 3.375 g (0 g Intravenous Stopped 11/13/18 1454)  vancomycin (VANCOCIN) IVPB 1000 mg/200 mL premix (0 mg Intravenous Stopped 11/13/18 1454)  sodium chloride 0.9 % bolus 1,000 mL (0 mLs Intravenous Stopped 11/13/18 1454)    And  sodium chloride 0.9 % bolus 1,000 mL (0 mLs Intravenous Stopped 11/13/18 1540)    And  sodium chloride 0.9 % bolus 250 mL (0 mLs Intravenous Stopped 11/13/18 1539)    Mobility walks with device High fall risk   Focused Assessments wound care.    R Recommendations: See Admitting Provider Note  Report given to:   Additional Notes:

## 2018-11-13 NOTE — Progress Notes (Signed)
CrCL < 15 - enoxaparin is not recommended. Switched to heparin 5000 unit SQ.    Eleonore Chiquito, PharmD, BCPS.

## 2018-11-13 NOTE — ED Notes (Signed)
hospitalist at bedside to assess patient plan admission .

## 2018-11-13 NOTE — H&P (Addendum)
[facility] NAME: Monica [name]    MR#:  [id number]  DATE OF BIRTH:  [birth year]  DATE OF ADMISSION:  11/13/2018  PRIMARY CARE PHYSICIAN: [name], MD   REQUESTING/REFERRING PHYSICIAN: [name]  CHIEF COMPLAINT:   Chief Complaint  Patient presents with  . Abnormal Lab    HISTORY OF PRESENT ILLNESS:  Monica [name]  is a 85 y.o. female with a known history of diabetes mellitus, prior history of sacral osteomyelitis previously treated with IV antibiotics via PICC line, COPD, hypertension, hyperlipidemia who is bedbound at baseline was brought in from nursing home today with complaints of nausea and vomiting.  No abdominal pains.  No fevers.  Patient was evaluated in the emergency room and found to have blood pressure as low as 87/57.  Patient was initially hypothermic with temperature of 96.6.  Recent temperature of 97.9.  Laboratory studies done significant for acute kidney injury with creatinine of 2.99.  Lactic acid level of 3.1.  Evidence of leukocytosis with white count of 32,000.  Patient noted to have a large area of sacral decubitus ulcer with purulent drainage.  Patient was diagnosed with severe sepsis secondary to infected sacral decubitus ulcer.  Patient started on broad-spectrum IV antibiotics in the emergency room.  Medical service called to admit patient for further evaluation and management.  PAST MEDICAL HISTORY:   Past Medical History:  Diagnosis Date  . B12 deficiency   . COPD (chronic obstructive pulmonary disease) ([place])   . Diabetes mellitus   . H/O: Bell's palsy   . Hyperlipidemia   . Hypertension   . Lung mass April 2011   s/p biopsy, no cancer cells found, Repeat CT Dec 2012 unchanged  . Osteomyelitis ([place])     PAST SURGICAL HISTORY:   Past Surgical History:  Procedure Laterality Date  . ABDOMINAL HYSTERECTOMY    . APPLICATION OF WOUND VAC N/A 10/16/2018   Procedure: APPLICATION OF WOUND VAC;   Surgeon: [name], MD;  Location: [facility] ORS;  Service: General;  Laterality: N/A;  . CHOLECYSTECTOMY    . INCISION AND DRAINAGE OF WOUND N/A 10/16/2018   Procedure: IRRIGATION AND DEBRIDEMENT WOUND;  Surgeon: [name], MD;  Location: [facility] ORS;  Service: General;  Laterality: N/A;  . LUNG BIOPSY  2012   for positive PET, negative biopsy for malignancy  . SPINE SURGERY  2001   Lumbar    SOCIAL HISTORY:   Social History   Tobacco Use  . Smoking status: Former Smoker    Packs/day: 0.50    Years: 5.00    Pack years: 2.50    Types: Cigarettes  . Smokeless tobacco: Never Used  . Tobacco comment: quit approx 2016  Substance Use Topics  . Alcohol use: No    FAMILY HISTORY:   Family History  Problem Relation Age of Onset  . Cancer Mother 4       Breast  . Diabetes Mother   . Heart disease Father 72       Acute MI  . Heart disease Sister   . Arthritis Sister   . Heart disease Brother     DRUG ALLERGIES:  No Known Allergies  REVIEW OF SYSTEMS:   Review of Systems  Constitutional: Negative for chills and fever.  HENT: Negative for hearing loss and tinnitus.   Eyes: Negative for blurred vision and double vision.  Respiratory: Negative for cough, hemoptysis and sputum production.   Cardiovascular:  Negative for chest pain and palpitations.  Gastrointestinal: Positive for nausea and vomiting. Negative for abdominal pain and heartburn.  Genitourinary: Negative for dysuria and urgency.  Musculoskeletal: Negative for myalgias and neck pain.       Large sacral decubitus ulcer with purulent drainage.  Patient reports being bedbound at baseline.  Skin: Negative for itching and rash.  Neurological: Negative for dizziness and headaches.  Psychiatric/Behavioral: Negative for depression and suicidal ideas.    MEDICATIONS AT HOME:   Prior to Admission medications   Medication Sig Start Date End Date Taking? Authorizing Provider  ACCU-CHEK GUIDE test strip USE AS  DIRECTED TWICE A DAY 11/11/18  Yes [name], MD  acetaminophen (TYLENOL) 325 MG tablet Take 2 tablets (650 mg total) by mouth every 6 (six) hours as needed for mild pain (or Fever >/= 101). 10/20/18  Yes Wieting, Richard, MD  collagenase (SANTYL) ointment Apply topically daily. 10/21/18  Yes Wieting, Richard, MD  docusate sodium (COLACE) 100 MG capsule Take 1 capsule (100 mg total) by mouth 2 (two) times daily. Patient taking differently: Take 100 mg by mouth every 8 (eight) hours as needed for mild constipation.  10/20/18  Yes Wieting, Delfino Lovett, MD  Ensure Max Protein (ENSURE MAX PROTEIN) LIQD Take 330 mLs (11 oz total) by mouth 2 (two) times daily between meals. 10/20/18  Yes Wieting, Richard, MD  lamoTRIgine (LAMICTAL) 25 MG tablet Take 2 tablets (50 mg total) by mouth at bedtime. 10/20/18  Yes Loletha Grayer, MD  metFORMIN (GLUCOPHAGE) 500 MG tablet Take 1 tablet (500 mg total) by mouth 2 (two) times daily with a meal. 10/20/18  Yes Wieting, Richard, MD  metoprolol succinate (TOPROL-XL) 50 MG 24 hr tablet Take 1 tablet (50 mg total) by mouth daily. Take with or immediately following a meal. 10/21/18  Yes Wieting, Richard, MD  multivitamin-lutein [facility]) CAPS capsule Take 1 capsule by mouth daily. 10/21/18  Yes Wieting, Richard, MD  oxyCODONE (OXY IR/ROXICODONE) 5 MG immediate release tablet Take 1 tablet (5 mg total) by mouth every 6 (six) hours as needed for moderate pain or severe pain. 10/26/18  Yes Gladstone Lighter, MD  Probiotic Product (PROBIOTIC DAILY) CAPS Take 1 capsule by mouth daily. 10/26/18  Yes [place]stone Lighter, MD  sertraline (ZOLOFT) 50 MG tablet Take 1 tablet (50 mg total) by mouth daily. 09/25/18  Yes [name], MD      VITAL SIGNS:  Blood pressure 118/76, pulse 62, temperature 97.9 F (36.6 C), temperature source Rectal, resp. rate 18, height 5\' 7"  (1.702 m), weight 70.5 kg, SpO2 100 %.  PHYSICAL EXAMINATION:  Physical Exam  GENERAL:  85 y.o.-year-old patient  lying in the bed with no acute distress.  EYES: Pupils equal, round, reactive to light and accommodation. No scleral icterus. Extraocular muscles intact.  HEENT: Head atraumatic, normocephalic. Oropharynx and nasopharynx clear.  NECK:  Supple, no jugular venous distention. No thyroid enlargement, no tenderness.  LUNGS: Normal breath sounds bilaterally, no wheezing, rales,rhonchi or crepitation. No use of accessory muscles of respiration.  CARDIOVASCULAR: S1, S2 normal. No murmurs, rubs, or gallops.  ABDOMEN: Soft, nontender, nondistended. Bowel sounds present. No organomegaly or mass.  Patient has a large sacral decubitus ulcer measuring 8 cm x 10 cm with evidence of purulent drainage noted. EXTREMITIES: No pedal edema, cyanosis, or clubbing.  NEUROLOGIC: Generalized weakness.  Patient reports being bedbound at baseline.  Gait not checked.  PSYCHIATRIC: The patient is alert and oriented x 3.  SKIN: No obvious rash, lesion, or  ulcer.   LABORATORY PANEL:   CBC Recent Labs  Lab 11/13/18 1241  WBC 32.5*  HGB 11.8*  HCT 38.0  PLT 337   ------------------------------------------------------------------------------------------------------------------  Chemistries  Recent Labs  Lab 11/13/18 1241  NA 140  K 5.0  CL 109  CO2 17*  GLUCOSE 186*  BUN 95*  CREATININE 2.99*  CALCIUM 8.4*  AST 30  ALT 43  ALKPHOS 158*  BILITOT 0.4   ------------------------------------------------------------------------------------------------------------------  Cardiac Enzymes No results for input(s): TROPONINI in the last 168 hours. ------------------------------------------------------------------------------------------------------------------  RADIOLOGY:  Dg Chest Port 1 View  Result Date: 11/13/2018 CLINICAL DATA:  Nausea.  Sepsis. EXAM: PORTABLE CHEST 1 VIEW COMPARISON:  One-view chest x-ray 10/22/2018 FINDINGS: The heart size and mediastinal contours are within normal limits. Both lungs  are clear. The visualized skeletal structures are unremarkable. IMPRESSION: Negative one-view chest x-ray Electronically Signed   By: San Morelle M.D.   On: 11/13/2018 13:55      IMPRESSION AND PLAN:  Patient is an 85 year old female with history of diabetes mellitus, prior history of sacral osteomyelitis previously treated with IV antibiotics via PICC line, COPD, hypertension, hyperlipidemia who is bedbound at baseline was brought in from nursing home today with complaints of nausea and vomiting.  Noted to have purulent drainage from sacral decubitus ulcer.  Diagnosed with severe sepsis secondary to infected sacral decubitus ulcer.   1.  Severe sepsis secondary to infected sacral decubitus ulcer Manifested by presence of hypothermia, hypotension, acute kidney injury,, elevated lactic acid level in the process of infected sacral decubitus ulcer. Placed on broad-spectrum IV antibiotics with IV vancomycin and cefepime with pharmacy to dose. General surgery consult placed.  Message sent to Dr. Dahlia Byes.  Patient will benefit from debridement. IV fluid hydration.  Monitor clinically. I spoke with the surgeon Dr. Dahlia Byes.  Plan is to keep patient n.p.o. after midnight for surgical debridement in a.m.  2.  Acute kidney injury Most likely prerenal from severe sepsis.  Being hydrated with IV fluids.  If no improvement in renal function in a.m. we will consult nephrology and request further melitracen.  3.  Diabetes mellitus type 2 Hold metformin.  Placed on sliding scale insulin coverage.  Glycosylated hemoglobin level in a.m.  4.  Hypertension Blood pressure stable at this time.  Monitor clinically.  DVT prophylaxis; heparin  All the records are reviewed and case discussed with ED provider. Management plans discussed with the patient, family and they are in agreement.  CODE STATUS: Full code  TOTAL TIME TAKING CARE OF THIS PATIENT: 58 minutes.    Sindi Beckworth M.D on 11/13/2018 at 3:36 PM   Between 7am to 6pm - Pager - [phone]  After 6pm go to [email or web address] - Proofreader  [facility]  Office  [phone]  CC: Primary care physician; [name], MD   Note: This dictation was prepared with Dragon dictation along with smaller phrase technology. Any transcriptional errors that result from this process are unintentional.

## 2018-11-13 NOTE — TOC Initial Note (Signed)
Transition of Care [facility]) - Initial/Assessment Note    Patient Details  Name: Monica [name] MRN: [id number] Date of Birth: September 29, 1929  Transition of Care [facility]) CM/SW Contact:    Katrina Stack, RN Phone Number: 11/13/2018, 5:06 PM  Clinical Narrative:                Patient discharged to [facility] from [facility] approximately two weeks ago  with IV antibiotics via and wound vac for stage 4 sacral decubitus.  She returns to ED today from the facility with severe sepsis due to the sacral decubitus.  She did not transport to the ED with a wound vac. She is very lethargic. Left voicemail message for kelly from [facility]. At present, patient is very ill. Surgical consult is pending for likely surgical debridement of the decubitus   Expected Discharge Plan: [place] Barriers to Discharge: No Barriers Identified   Patient Goals and CMS Choice Patient states their goals for this hospitalization and ongoing recovery are:: (Patient is Unable to state at time of assessment) CMS Medicare.gov Compare Post Acute Care list provided to:: (Nothing provided at time of assessment) Choice offered to / list presented to : NA  Expected Discharge Plan and Services Expected Discharge Plan: [place]   Discharge Planning Services: CM Consult Post Acute Care Choice: [place] Living arrangements for the past 2 months: [place], [place]                 DME Arranged: N/A DME Agency: NA         HH Agency: NA        Prior Living Arrangements/Services Living arrangements for the past 2 months: [place], [place] Lives with:: Self, Adult Children, Spouse Patient language and need for interpreter reviewed:: No Do you feel safe going back to the place where you live?: (Unable to determine at time of assessment)      Need for Family Participation in Patient Care: No (Comment)(If  returns to skilled nursing- no.  If returns home- yes) Care giver support system in place?: Yes (comment) Current home services: (Not able to assess) Criminal Activity/Legal Involvement Pertinent to Current Situation/Hospitalization: No - Comment as needed  Activities of Daily Living      Permission Sought/Granted Permission sought to share information with : (Unable to obtain any permissions at time of assessment) Permission granted to share information with : No              Emotional Assessment Appearance:: Appears stated age Attitude/Demeanor/Rapport: Lethargic, Unable to Assess Affect (typically observed): Other (comment)(very lethargic and difficult to awake) Orientation: : (Unable to assess) Alcohol / Substance Use: Not Applicable Psych Involvement: No (comment)  Admission diagnosis:  sepsis Patient Active Problem List   Diagnosis Date Noted  . Sepsis ([place]) 10/22/2018  . Stage IV pressure ulcer of sacral region ([place])   . Weakness 10/13/2018  . Pressure injury of skin 10/13/2018  . Spinal stenosis of lumbar region 10/06/2018  . Fecal incontinence 10/06/2018  . Type 2 diabetes mellitus with diabetic nephropathy, with long-term current use of insulin ([place]) 09/27/2018  . CKD stage 3 due to type 2 diabetes mellitus ([place]) 09/27/2018  . Delirium due to another medical condition, acute, hyperactive 09/27/2018  . Impaired ambulation 09/27/2018  . Recurrent falls 06/11/2017  . Dystrophic nail 11/13/2016  . Allergic rhinitis 09/30/2015  . Major depressive disorder, recurrent episode with anxious  distress ([place]) 10/10/2013  . Vitamin D deficiency 10/08/2013  . Bilateral chronic knee pain 09/21/2011  . Tobacco abuse counseling 07/08/2011  . Incontinence of urine 06/13/2011  . History of tobacco abuse 06/11/2011  . Hyperlipidemia   . Hypertension   . COPD (chronic obstructive pulmonary disease) ([place])   . B12 deficiency   . Lung mass    PCP:  [name], MD Pharmacy:    [id number] [place], [address] [place] [address] Phone: [phone] Fax: [phone]  [facility] [address], [place] - [place] [address] Phone: [phone] Fax: [phone]     Social Determinants of Health (SDOH) Interventions    Readmission Risk Interventions Readmission Risk Prevention Plan 10/24/2018 10/20/2018  Transportation Screening Complete Complete  PCP or Specialist Appt within 3-5 Days Complete Not Complete  Not Complete comments Patient will be going to SNF Pt going to SNF  [place] or [place] Not Complete Not Complete  HRI or Home Care Consult comments Patient going to SNF Pt going to SNF  Social Work Consult for [place] Planning/Counseling Complete Complete  Palliative Care Screening Complete Complete  Medication Review Press photographer) Complete Complete  Some recent data might be hidden

## 2018-11-13 NOTE — ED Provider Notes (Signed)
[facility] Emergency Department Provider Note   ____________________________________________    I have reviewed the triage vital signs and the nursing notes.   HISTORY  Chief Complaint Sepsis    HPI TECORA [name] is a 85 y.o. female with a history of diabetes, sacral osteomyelitis previously treated with PICC line, antibiotics sent in for evaluation for abnormal lab values.  Reportedly patient had elevated white blood cell count.  Patient is unable to provide significant history, she states that she feels "bad ".  Does not know if she has had a fever  Past Medical History:  Diagnosis Date  . B12 deficiency   . COPD (chronic obstructive pulmonary disease) ([facility])   . Diabetes mellitus   . H/O: Bell's palsy   . Hyperlipidemia   . Hypertension   . Lung mass April 2011   s/p biopsy, no cancer cells found, Repeat CT Dec 2012 unchanged  . Osteomyelitis [facility])     Patient Active Problem List   Diagnosis Date Noted  . Sepsis ([place]) 10/22/2018  . Stage IV pressure ulcer of sacral region ([place])   . Weakness 10/13/2018  . Pressure injury of skin 10/13/2018  . Spinal stenosis of lumbar region 10/06/2018  . Fecal incontinence 10/06/2018  . Type 2 diabetes mellitus with diabetic nephropathy, with long-term current use of insulin ([place]) 09/27/2018  . CKD stage 3 due to type 2 diabetes mellitus ([place]) 09/27/2018  . Delirium due to another medical condition, acute, hyperactive 09/27/2018  . Impaired ambulation 09/27/2018  . Recurrent falls 06/11/2017  . Dystrophic nail 11/13/2016  . Allergic rhinitis 09/30/2015  . Major depressive disorder, recurrent episode with anxious distress ([place]) 10/10/2013  . Vitamin D deficiency 10/08/2013  . Bilateral chronic knee pain 09/21/2011  . Tobacco abuse counseling 07/08/2011  . Incontinence of urine 06/13/2011  . History of tobacco abuse 06/11/2011  . Hyperlipidemia   . Hypertension   . COPD (chronic obstructive pulmonary  disease) ([place])   . B12 deficiency   . Lung mass     Past Surgical History:  Procedure Laterality Date  . ABDOMINAL HYSTERECTOMY    . APPLICATION OF WOUND VAC N/A 10/16/2018   Procedure: APPLICATION OF WOUND VAC;  Surgeon: [name], MD;  Location: [facility] ORS;  Service: General;  Laterality: N/A;  . CHOLECYSTECTOMY    . INCISION AND DRAINAGE OF WOUND N/A 10/16/2018   Procedure: IRRIGATION AND DEBRIDEMENT WOUND;  Surgeon: [name], MD;  Location: [facility] ORS;  Service: General;  Laterality: N/A;  . LUNG BIOPSY  2012   for positive PET, negative biopsy for malignancy  . SPINE SURGERY  2001   Lumbar    Prior to Admission medications   Medication Sig Start Date End Date Taking? Authorizing Provider  ACCU-CHEK GUIDE test strip USE AS DIRECTED TWICE A DAY 11/11/18   [name], MD  acetaminophen (TYLENOL) 325 MG tablet Take 2 tablets (650 mg total) by mouth every 6 (six) hours as needed for mild pain (or Fever >/= 101). 10/20/18   Loletha Grayer, MD  collagenase (SANTYL) ointment Apply topically daily. 10/21/18   Loletha Grayer, MD  docusate sodium (COLACE) 100 MG capsule Take 1 capsule (100 mg total) by mouth 2 (two) times daily. 10/20/18   Loletha Grayer, MD  Ensure Max Protein (ENSURE MAX PROTEIN) LIQD Take 330 mLs (11 oz total) by mouth 2 (two) times daily between meals. 10/20/18   Loletha Grayer, MD  lamoTRIgine (LAMICTAL) 25 MG tablet Take  2 tablets (50 mg total) by mouth at bedtime. 10/20/18   Loletha Grayer, MD  metFORMIN (GLUCOPHAGE) 500 MG tablet Take 1 tablet (500 mg total) by mouth 2 (two) times daily with a meal. 10/20/18   Loletha Grayer, MD  metoprolol succinate (TOPROL-XL) 50 MG 24 hr tablet Take 1 tablet (50 mg total) by mouth daily. Take with or immediately following a meal. 10/21/18   Loletha Grayer, MD  multivitamin-lutein [facility]) CAPS capsule Take 1 capsule by mouth daily. 10/21/18   Loletha Grayer, MD  oxyCODONE (OXY IR/ROXICODONE) 5 MG  immediate release tablet Take 1 tablet (5 mg total) by mouth every 6 (six) hours as needed for moderate pain or severe pain. 10/26/18   Gladstone Lighter, MD  Probiotic Product (PROBIOTIC DAILY) CAPS Take 1 capsule by mouth daily. 10/26/18   Gladstone Lighter, MD  sertraline (ZOLOFT) 50 MG tablet Take 1 tablet (50 mg total) by mouth daily. 09/25/18   [name], MD     Allergies Patient has no known allergies.  Family History  Problem Relation Age of Onset  . Cancer Mother 68       Breast  . Diabetes Mother   . Heart disease Father 67       Acute MI  . Heart disease Sister   . Arthritis Sister   . Heart disease Brother     Social History Social History   Tobacco Use  . Smoking status: Former Smoker    Packs/day: 0.50    Years: 5.00    Pack years: 2.50    Types: Cigarettes  . Smokeless tobacco: Never Used  . Tobacco comment: quit approx 2016  Substance Use Topics  . Alcohol use: No  . Drug use: No    Review of Systems limited by altered mental status     ____________________________________________   PHYSICAL EXAM:  VITAL SIGNS: ED Triage Vitals  Enc Vitals Group     BP 11/13/18 1200 106/66     Pulse Rate 11/13/18 1200 71     Resp 11/13/18 1200 15     Temp 11/13/18 1206 (!) 96.6 F (35.9 C)     Temp Source 11/13/18 1206 Rectal     SpO2 11/13/18 1200 100 %     Weight 11/13/18 1204 70.5 kg (155 lb 6.8 oz)     Height 11/13/18 1204 1.702 m (5\' 7" )     Head Circumference --      Peak Flow --      Pain Score 11/13/18 1204 5     Pain Loc --      Pain Edu? --      Excl. in [place]? --     Constitutional: Alert Eyes: Conjunctivae are normal.   Nose: No congestion/rhinnorhea. Mouth/Throat: Mucous membranes are moist.    Cardiovascular: Normal rate, regular rhythm. Grossly normal heart sounds.  Good peripheral circulation. Respiratory: Normal respiratory effort.  No retractions. Lungs CTAB. Gastrointestinal: Soft and nontender. No distention.  No CVA  tenderness.  Musculoskeletal: No lower extremity tenderness nor edema.  Warm and well perfused Neurologic:  Normal speech and language. No gross focal neurologic deficits are appreciated.  Skin:  Skin is warm, dry.  Large sacral decubitus ulceration with foul smell, small amount of discharge with surrounding erythema Psychiatric: Mood and affect are normal. Speech and behavior are normal.  ____________________________________________   LABS (all labs ordered are listed, but only abnormal results are displayed)  Labs Reviewed  LACTIC ACID, PLASMA - Abnormal; Notable for the following  components:      Result Value   Lactic Acid, Venous 3.1 (*)    All other components within normal limits  COMPREHENSIVE METABOLIC PANEL - Abnormal; Notable for the following components:   CO2 17 (*)    Glucose, Bld 186 (*)    BUN 95 (*)    Creatinine, Ser 2.99 (*)    Calcium 8.4 (*)    Total Protein 6.0 (*)    Albumin 2.0 (*)    Alkaline Phosphatase 158 (*)    GFR calc non Af Amer 14 (*)    GFR calc Af Amer 16 (*)    All other components within normal limits  CBC WITH DIFFERENTIAL/PLATELET - Abnormal; Notable for the following components:   WBC 32.5 (*)    Hemoglobin 11.8 (*)    RDW 18.7 (*)    Neutro Abs 28.1 (*)    Monocytes Absolute 1.2 (*)    Abs Immature Granulocytes 1.10 (*)    All other components within normal limits  SARS CORONAVIRUS 2 (HOSPITAL ORDER, [place] [facility])  CULTURE, BLOOD (ROUTINE X 2)  CULTURE, BLOOD (ROUTINE X 2)  URINE CULTURE  LACTIC ACID, PLASMA  URINALYSIS, ROUTINE W REFLEX MICROSCOPIC   ____________________________________________  EKG  ED ECG REPORT I, [name], the attending physician, personally viewed and interpreted this ECG.  Date: 11/13/2018  Rhythm: normal sinus rhythm QRS Axis: normal Intervals: normal ST/T Wave abnormalities: normal Narrative Interpretation: no evidence of acute ischemia   ____________________________________________  RADIOLOGY  Chest x-ray unremarkable, reviewed by me ____________________________________________   PROCEDURES  Procedure(s) performed: No  Procedures   Critical Care performed: yes  CRITICAL CARE Performed by: [name]   Total critical care time: 30 minutes  Critical care time was exclusive of separately billable procedures and treating other patients.  Critical care was necessary to treat or prevent imminent or life-threatening deterioration.  Critical care was time spent personally by me on the following activities: development of treatment plan with patient and/or surrogate as well as nursing, discussions with consultants, evaluation of patient's response to treatment, examination of patient, obtaining history from patient or surrogate, ordering and performing treatments and interventions, ordering and review of laboratory studies, ordering and review of radiographic studies, pulse oximetry and re-evaluation of patient's condition.  ____________________________________________   INITIAL IMPRESSION / ASSESSMENT AND PLAN / ED COURSE  Pertinent labs & imaging results that were available during my care of the patient were reviewed by me and considered in my medical decision making (see chart for details).  Patient with a history of sacral decubitus, with osteomyelitis sent in for elevated white blood cell count.  She is hypothermic here by rectal temperature.  Initial blood pressure stable, heart rate normal.  We have called a code sepsis for presumed infection likely related to decubitus ulcer.  Will treat with broad-spectrum IV vancomycin, IV Zosyn CBC returned markedly elevated however noted to have one blood pressure of 92/57, we will give 30 mils per kilogram for possible severe sepsis/shock  Improvement blood pressure with IV fluids.  COVID test is negative.  Will discuss with the hospitalist for admission     VELICIA DEJAGER was evaluated in Emergency Department on 11/13/2018 for the symptoms described in the history of present illness. She was evaluated in the context of the global COVID-19 pandemic, which necessitated consideration that the patient might be at risk for infection with the SARS-CoV-2 virus that causes COVID-19. Institutional protocols and algorithms that pertain to  the evaluation of patients at risk for COVID-19 are in a state of rapid change based on information released by regulatory bodies including the CDC and federal and state organizations. These policies and algorithms were followed during the patient's care in the ED.     ____________________________________________   FINAL CLINICAL IMPRESSION(S) / ED DIAGNOSES  Final diagnoses:  Septic shock ([place])  Acute kidney injury ([place])        Note:  This document was prepared using Dragon voice recognition software and may include unintentional dictation errors.   [name], MD 11/13/18 [phone]

## 2018-11-13 NOTE — Progress Notes (Signed)
Care Alignment Note  Advanced Directives Documents (Living Will, Power of Attorney) currently in the EHR no advanced directives documents available .  Has the patient discussed their wishes with their family/healthcare power of attorney no.  What does the patient/decision maker understand about their medical condition and the natural course of their disease.  Severe sepsis secondary to infected sacral decubitus ulcer.  Acute kidney injury.  Hypertension.  Diabetes mellitus and hyperlipidemia  What is the patient/decision maker's biggest fear or concern for the future pain and suffering   What is the most important goal for this patient should their health condition worsen maintenance of function.  Current   Code Status: Full Code  Current code status has been reviewed/updated.  Time spent:22 minutes

## 2018-11-13 NOTE — ED Notes (Signed)
Urinary -Wick applied to suction.

## 2018-11-13 NOTE — Progress Notes (Signed)
CODE SEPSIS - PHARMACY COMMUNICATION  **Broad Spectrum Antibiotics should be administered within 1 hour of Sepsis diagnosis**  Time Code Sepsis Called/Page Received: 1211  Antibiotics Ordered: Vancomycin/Zosyn   Time of 1st antibiotic administration: Zosyn 1316  Additional action taken by pharmacy: Spoke to RN via phone. She stated unable to retrieve antibiotics from [facility]. Went to ED and recovered [employer]. Handed antibiotics to RN.  If necessary, Name of Provider/Nurse Contacted: Olegario Shearer ,PharmD Clinical Pharmacist  11/13/2018  12:23 PM

## 2018-11-13 NOTE — ED Notes (Signed)
Fluid bolus completed. Repeat lactate drawn and sent.

## 2018-11-13 NOTE — ED Notes (Signed)
pastient straight cath for clean catch urine. Patient dry, no urine output noted via bladder scanner.

## 2018-11-13 NOTE — Progress Notes (Signed)
Pt temp 95.0 rectal. Primary nurse paged and spoke to Berkley Harvey NP. Orders received for warming blanket. Primary nurse to continue to monitor.

## 2018-11-13 NOTE — Consult Note (Signed)
Pharmacy Antibiotic Note  Monica [name] is a 85 y.o. female admitted on 11/13/2018 with sepsis.  Pharmacy has been consulted for cefepime and vancomcyin  dosing.  Plan: Pt was given vancomycin 1000 mg x 1 in the ED. Will given an addition 750 mg dose for a total of 1750 mg loading dose. Pt has unstable renal function. Will order a 24 hour vancomycin level and re-dose as necessary. No note of dialysis.   Will order cefepime 2 g q24H   Height: 5\' 7"  (170.2 cm) Weight: 155 lb 6.8 oz (70.5 kg) IBW/kg (Calculated) : 61.6  Temp (24hrs), Avg:97.3 F (36.3 C), Min:96.6 F (35.9 C), Max:97.9 F (36.6 C)  Recent Labs  Lab 11/13/18 1241 11/13/18 1242  WBC 32.5*  --   CREATININE 2.99*  --   LATICACIDVEN  --  3.1*    Estimated Creatinine Clearance: 13.1 mL/min (A) (by C-G formula based on SCr of 2.99 mg/dL (H)).    No Known Allergies  Antimicrobials this admission: 5/22 cefepime >>  5/22 vancomycin >>   Dose adjustments this admission: None  Microbiology results: 5/22 BCx: pending  Thank you for allowing pharmacy to be a part of this patient's care.  Oswald Hillock, PharmD, BCPS 11/13/2018 3:42 PM

## 2018-11-13 NOTE — ED Triage Notes (Signed)
Patient sent from NH with c/o low temperature, and abnormal labs. Patient reports pain to buttock area, and positive nausea.

## 2018-11-14 ENCOUNTER — Encounter: Payer: Self-pay | Admitting: Internal Medicine

## 2018-11-14 DIAGNOSIS — L98423 Non-pressure chronic ulcer of back with necrosis of muscle: Secondary | ICD-10-CM

## 2018-11-14 LAB — CBC
HCT: 32.7 % — ABNORMAL LOW (ref 36.0–46.0)
Hemoglobin: 10.2 g/dL — ABNORMAL LOW (ref 12.0–15.0)
MCH: 27.3 pg (ref 26.0–34.0)
MCHC: 31.2 g/dL (ref 30.0–36.0)
MCV: 87.7 fL (ref 80.0–100.0)
Platelets: 286 10*3/uL (ref 150–400)
RBC: 3.73 MIL/uL — ABNORMAL LOW (ref 3.87–5.11)
RDW: 18.8 % — ABNORMAL HIGH (ref 11.5–15.5)
WBC: 25.1 10*3/uL — ABNORMAL HIGH (ref 4.0–10.5)
nRBC: 0.1 % (ref 0.0–0.2)

## 2018-11-14 LAB — GLUCOSE, CAPILLARY
Glucose-Capillary: 102 mg/dL — ABNORMAL HIGH (ref 70–99)
Glucose-Capillary: 145 mg/dL — ABNORMAL HIGH (ref 70–99)
Glucose-Capillary: 124 mg/dL — ABNORMAL HIGH (ref 70–99)
Glucose-Capillary: 111 mg/dL — ABNORMAL HIGH (ref 70–99)

## 2018-11-14 LAB — URINALYSIS, ROUTINE W REFLEX MICROSCOPIC
Bilirubin Urine: NEGATIVE
Glucose, UA: NEGATIVE mg/dL
Ketones, ur: 5 mg/dL — AB
Leukocytes,Ua: NEGATIVE
Nitrite: NEGATIVE
Protein, ur: NEGATIVE mg/dL
Specific Gravity, Urine: 1.013 (ref 1.005–1.030)
pH: 5 (ref 5.0–8.0)

## 2018-11-14 LAB — COMPREHENSIVE METABOLIC PANEL
ALT: 32 U/L (ref 0–44)
AST: 22 U/L (ref 15–41)
Albumin: 1.7 g/dL — ABNORMAL LOW (ref 3.5–5.0)
Alkaline Phosphatase: 122 U/L (ref 38–126)
Anion gap: 13 (ref 5–15)
BUN: 84 mg/dL — ABNORMAL HIGH (ref 8–23)
CO2: 14 mmol/L — ABNORMAL LOW (ref 22–32)
Calcium: 7.6 mg/dL — ABNORMAL LOW (ref 8.9–10.3)
Chloride: 117 mmol/L — ABNORMAL HIGH (ref 98–111)
Creatinine, Ser: 2.56 mg/dL — ABNORMAL HIGH (ref 0.44–1.00)
GFR calc Af Amer: 19 mL/min — ABNORMAL LOW (ref >60)
GFR calc non Af Amer: 16 mL/min — ABNORMAL LOW (ref >60)
Glucose, Bld: 111 mg/dL — ABNORMAL HIGH (ref 70–99)
Potassium: 4.1 mmol/L (ref 3.5–5.1)
Sodium: 144 mmol/L (ref 135–145)
Total Bilirubin: 0.7 mg/dL (ref 0.3–1.2)
Total Protein: 4.9 g/dL — ABNORMAL LOW (ref 6.5–8.1)

## 2018-11-14 LAB — PROTIME-INR
INR: 1.4 — ABNORMAL HIGH (ref 0.8–1.2)
Prothrombin Time: 17.4 seconds — ABNORMAL HIGH (ref 11.4–15.2)

## 2018-11-14 LAB — HEMOGLOBIN A1C
Hgb A1c MFr Bld: 6.5 % — ABNORMAL HIGH (ref 4.8–5.6)
Mean Plasma Glucose: 139.85 mg/dL

## 2018-11-14 LAB — SURGICAL PCR SCREEN
MRSA, PCR: NEGATIVE
Staphylococcus aureus: NEGATIVE

## 2018-11-14 LAB — PROCALCITONIN: Procalcitonin: 0.19 ng/mL

## 2018-11-14 LAB — VANCOMYCIN, RANDOM: Vancomycin Rm: 18

## 2018-11-14 LAB — MAGNESIUM: Magnesium: 1.8 mg/dL (ref 1.7–2.4)

## 2018-11-14 LAB — CORTISOL-AM, BLOOD: Cortisol - AM: 31.8 ug/dL — ABNORMAL HIGH (ref 6.7–22.6)

## 2018-11-14 MED ORDER — MUPIROCIN 2 % EX OINT
1.0000 "application " | TOPICAL_OINTMENT | Freq: Two times a day (BID) | CUTANEOUS | Status: DC
  Administered 2018-11-14 (×2): 1 via NASAL
  Filled 2018-11-14: qty 22

## 2018-11-14 MED ORDER — SODIUM CHLORIDE 0.9 % IV SOLN
2.0000 g | INTRAVENOUS | Status: DC
  Administered 2018-11-15 – 2018-11-17 (×3): 2 g via INTRAVENOUS
  Filled 2018-11-14 (×5): qty 2

## 2018-11-14 MED ORDER — VANCOMYCIN HCL IN DEXTROSE 750-5 MG/150ML-% IV SOLN: 750.0000 mg | INTRAVENOUS | Status: DC

## 2018-11-14 MED ORDER — SODIUM CHLORIDE 0.45 % IV SOLN
INTRAVENOUS | Status: DC
  Administered 2018-11-14 – 2018-11-18 (×6): via INTRAVENOUS

## 2018-11-14 MED ORDER — ONDANSETRON HCL 4 MG/2ML IJ SOLN
INTRAMUSCULAR | Status: AC
  Administered 2018-11-14: 04:00:00 4 mg
  Filled 2018-11-14: qty 2

## 2018-11-14 MED ORDER — VANCOMYCIN HCL IN DEXTROSE 750-5 MG/150ML-% IV SOLN
750.0000 mg | INTRAVENOUS | Status: DC
  Filled 2018-11-14: qty 150

## 2018-11-14 MED ORDER — ONDANSETRON HCL 4 MG/2ML IJ SOLN
4.0000 mg | Freq: Three times a day (TID) | INTRAMUSCULAR | Status: DC | PRN
  Administered 2018-11-16: 4 mg via INTRAVENOUS
  Filled 2018-11-14: qty 2

## 2018-11-14 MED ORDER — VANCOMYCIN HCL IN DEXTROSE 750-5 MG/150ML-% IV SOLN
750.0000 mg | INTRAVENOUS | Status: DC
  Administered 2018-11-14: 750 mg via INTRAVENOUS
  Filled 2018-11-14 (×2): qty 150

## 2018-11-14 MED ORDER — COLLAGENASE 250 UNIT/GM EX OINT
TOPICAL_OINTMENT | Freq: Two times a day (BID) | CUTANEOUS | Status: DC
  Administered 2018-11-14 – 2018-11-18 (×9): via TOPICAL
  Filled 2018-11-14 (×3): qty 30

## 2018-11-14 MED ORDER — SODIUM CHLORIDE 0.9 % IV SOLN
2.0000 g | INTRAVENOUS | Status: DC
  Administered 2018-11-14: 2 g via INTRAVENOUS
  Filled 2018-11-14: qty 2

## 2018-11-14 NOTE — Consult Note (Signed)
Pharmacy Antibiotic Note  Monica [name] is a 85 y.o. female admitted on 11/13/2018 with sepsis.  Pharmacy has been consulted for cefepime and vancomcyin  dosing.  Plan: Vanc Random 18. Will continue vancomycin 750mg  IV Q48hr. Serum creatinine ordered with am labs.   Will order cefepime 2 g q24H   Height: 5\' 7"  (170.2 cm) Weight: 155 lb 6.8 oz (70.5 kg) IBW/kg (Calculated) : 61.6  Temp (24hrs), Avg:96.6 F (35.9 C), Min:95 F (35 C), Max:98.5 F (36.9 C)  Recent Labs  Lab 11/13/18 1241 11/13/18 1242 11/13/18 1545 11/14/18 0438 11/14/18 1244  WBC 32.5*  --   --  25.1*  --   CREATININE 2.99*  --   --  2.56*  --   LATICACIDVEN  --  3.1* 1.8  --   --   VANCORANDOM  --   --   --   --  18    Estimated Creatinine Clearance: 15.3 mL/min (A) (by C-G formula based on SCr of 2.56 mg/dL (H)).    No Known Allergies  Antimicrobials this admission: 5/22 cefepime >>  5/22 vancomycin >>   Dose adjustments this admission: None  Microbiology results:  5/22 BCx: no growth < 24 hours  5/23 MRSA: negative  5/22 COVID: negative   Thank you for allowing pharmacy to be a part of this patient's care.  Simpson,Michael L, PharmD, BCPS 11/14/2018 3:40 PM

## 2018-11-14 NOTE — NC FL2 (Signed)
[place] LEVEL OF CARE SCREENING TOOL     IDENTIFICATION  Patient Name: Monica [name] Birthdate: 1929-07-31 Sex: female Admission Date (Current Location): 11/13/2018  [place] and [place] Number:  Monica [name] [id number] [place] and Address:  [facility], [address] [place], [place] [address]      Provider Number: [id number]  Attending Physician Name and Address:  Monica Bow, MD  Relative Name and Phone Number:       Current Level of Care: Hospital Recommended Level of Care: [place] Prior Approval Number:    Date Approved/Denied:   PASRR Number:    Discharge Plan: SNF    Current Diagnoses: Patient Active Problem List   Diagnosis Date Noted  . Sepsis ([place]) 10/22/2018  . Stage IV pressure ulcer of sacral region ([place])   . Weakness 10/13/2018  . Pressure injury of skin 10/13/2018  . Spinal stenosis of lumbar region 10/06/2018  . Fecal incontinence 10/06/2018  . Type 2 diabetes mellitus with diabetic nephropathy, with long-term current use of insulin ([place]) 09/27/2018  . CKD stage 3 due to type 2 diabetes mellitus ([place]) 09/27/2018  . Delirium due to another medical condition, acute, hyperactive 09/27/2018  . Impaired ambulation 09/27/2018  . Recurrent falls 06/11/2017  . Dystrophic nail 11/13/2016  . Allergic rhinitis 09/30/2015  . Major depressive disorder, recurrent episode with anxious distress ([place]) 10/10/2013  . Vitamin D deficiency 10/08/2013  . Bilateral chronic knee pain 09/21/2011  . Tobacco abuse counseling 07/08/2011  . Incontinence of urine 06/13/2011  . History of tobacco abuse 06/11/2011  . Hyperlipidemia   . Hypertension   . COPD (chronic obstructive pulmonary disease) ([place])   . B12 deficiency   . Lung mass     Orientation RESPIRATION BLADDER Height & Weight     (VEry lethargic and not oriented on admission)    Incontinent Weight: 155 lb 6.8 oz (70.5 kg) Height:  5\' 7"  (170.2 cm)   BEHAVIORAL SYMPTOMS/MOOD NEUROLOGICAL BOWEL NUTRITION STATUS  Other (Comment)(NONE) (NONE) Incontinent    AMBULATORY STATUS COMMUNICATION OF NEEDS Skin   Extensive Assist   Wound Vac(Stage 4 decubitus with purulent drainage. ) PU Stage 1 Dressing: (If continues with wound vac, usual changes are three tines weekly)                     Personal Care Assistance Level of Assistance  Bathing, Feeding, Dressing Bathing Assistance: Maximum assistance Feeding assistance: Limited assistance Dressing Assistance: Limited assistance Total Care Assistance: Maximum assistance   Functional Limitations Info    Sight Info: Adequate Hearing Info: Adequate      SPECIAL CARE FACTORS FREQUENCY        PT Frequency: 5 x week OT Frequency: 5 x week            Contractures Contractures Info: Not present    Additional Factors Info  Code Status               Current Medications (11/14/2018):  This is the current hospital active medication list Current Facility-Administered Medications  Medication Dose Route Frequency Provider Last Rate Last Dose  . 0.45 % sodium chloride infusion   Intravenous Continuous Sudini, Srikar, MD      . acetaminophen (TYLENOL) tablet 650 mg  650 mg Oral Q6H PRN Ojie, Jude, MD      . acidophilus (RISAQUAD) capsule 1 capsule  1 capsule Oral Daily Ojie, Jude, MD      . ceFEPIme (MAXIPIME) 2 g  in sodium chloride 0.9 % 100 mL IVPB  2 g Intravenous Q24H Stark Jock, Jude, MD   Stopped at 11/13/18 1629  . collagenase (SANTYL) ointment   Topical Daily Ojie, Jude, MD      . docusate sodium (COLACE) capsule 100 mg  100 mg Oral BID Ojie, Jude, MD      . heparin injection 5,000 Units  5,000 Units Subcutaneous Q8H Ojie, Jude, MD   5,000 Units at 11/14/18 0646  . insulin aspart (novoLOG) injection 0-5 Units  0-5 Units Subcutaneous QHS Ojie, Jude, MD      . insulin aspart (novoLOG) injection 0-9 Units  0-9 Units Subcutaneous TID WC Stark Jock, Jude, MD   2 Units at 11/13/18 1830  .  lamoTRIgine (LAMICTAL) tablet 50 mg  50 mg Oral QHS Ojie, Jude, MD      . metoprolol succinate (TOPROL-XL) 24 hr tablet 50 mg  50 mg Oral Daily Ojie, Jude, MD      . multivitamin with minerals tablet 1 tablet  1 tablet Oral Daily Ojie, Jude, MD      . mupirocin ointment (BACTROBAN) 2 % 1 application  1 application Nasal BID Ojie, Jude, MD      . ondansetron (ZOFRAN) injection 4 mg  4 mg Intravenous Q8H PRN Seals, Angela H, NP      . oxyCODONE (Oxy IR/ROXICODONE) immediate release tablet 5 mg  5 mg Oral Q6H PRN Ojie, Jude, MD      . protein supplement (ENSURE MAX) liquid  11 oz Oral BID BM Ojie, Jude, MD      . sertraline (ZOLOFT) tablet 50 mg  50 mg Oral Daily Ojie, Jude, MD   50 mg at 11/13/18 1550     Discharge Medications: Please see discharge summary for a list of discharge medications.  Relevant Imaging Results:  Relevant Lab Results:   Additional Information    Monica [name]  Louretta Shorten, LCSWA

## 2018-11-14 NOTE — Progress Notes (Signed)
Pt resting in bed. Denies nausea or pain. However, patient has not been interested in food throughout the day. She has been able to have small sips of protein shake. Patient was repositioned every 2 hours. Earlier this afternoon, bladder scan completed due to pt not voiding in 8 hrs.  Bladder scan showed >800 mls. MD Sudini made aware and received orders for In and out once and obtained 1000 mls out. Per MD, if this happens again and bladder scan shows urine >500 mls, proceed to place a Foley cath. Nursing care order placed.   Wynema Birch, RN

## 2018-11-14 NOTE — Progress Notes (Signed)
[facility] [place] NAME: Lisandra Mathisen    MR#:  [id number]  DATE OF BIRTH:  30-Aug-1929  SUBJECTIVE:  CHIEF COMPLAINT:   Chief Complaint  Patient presents with  . Abnormal Lab   Feels weak  REVIEW OF SYSTEMS:    Review of Systems  Constitutional: Positive for malaise/fatigue. Negative for chills and fever.  HENT: Negative for sore throat.   Eyes: Negative for blurred vision, double vision and pain.  Respiratory: Negative for cough, hemoptysis, shortness of breath and wheezing.   Cardiovascular: Negative for chest pain, palpitations, orthopnea and leg swelling.  Gastrointestinal: Negative for abdominal pain, constipation, diarrhea, heartburn, nausea and vomiting.  Genitourinary: Negative for dysuria and hematuria.  Musculoskeletal: Positive for back pain. Negative for joint pain.  Skin: Negative for rash.  Neurological: Negative for sensory change, speech change, focal weakness and headaches.  Endo/Heme/Allergies: Does not bruise/bleed easily.  Psychiatric/Behavioral: Negative for depression. The patient is not nervous/anxious.     DRUG ALLERGIES:  No Known Allergies  VITALS:  Blood pressure (!) 94/54, pulse 70, temperature (!) 97.4 F (36.3 C), temperature source Oral, resp. rate 17, height 5\' 7"  (1.702 m), weight 70.5 kg, SpO2 91 %.  PHYSICAL EXAMINATION:   Physical Exam  GENERAL:  85 y.o.-year-old patient lying in the bed with no acute distress.  EYES: Pupils equal, round, reactive to light and accommodation. No scleral icterus. Extraocular muscles intact.  HEENT: Head atraumatic, normocephalic. Oropharynx and nasopharynx clear.  NECK:  Supple, no jugular venous distention. No thyroid enlargement, no tenderness.  LUNGS: Normal breath sounds bilaterally, no wheezing, rales, rhonchi. No use of accessory muscles of respiration.  CARDIOVASCULAR: S1, S2 normal. No murmurs, rubs, or gallops.  ABDOMEN: Soft, nontender, nondistended.  Bowel sounds present. No organomegaly or mass.  EXTREMITIES: No cyanosis, clubbing or edema b/l.    NEUROLOGIC: Cranial nerves II through XII are intact. No focal Motor or sensory deficits b/l.   PSYCHIATRIC: The patient is alert and oriented x 3.  SKIN: large sacral wound 10 x 5 cm unstageable with necrosis  LABORATORY PANEL:   CBC Recent Labs  Lab 11/14/18 0438  WBC 25.1*  HGB 10.2*  HCT 32.7*  PLT 286   ------------------------------------------------------------------------------------------------------------------ Chemistries  Recent Labs  Lab 11/14/18 0438  NA 144  K 4.1  CL 117*  CO2 14*  GLUCOSE 111*  BUN 84*  CREATININE 2.56*  CALCIUM 7.6*  MG 1.8  AST 22  ALT 32  ALKPHOS 122  BILITOT 0.7   ------------------------------------------------------------------------------------------------------------------  Cardiac Enzymes No results for input(s): TROPONINI in the last 168 hours. ------------------------------------------------------------------------------------------------------------------  RADIOLOGY:  Dg Chest Port 1 View  Result Date: 11/13/2018 CLINICAL DATA:  Nausea.  Sepsis. EXAM: PORTABLE CHEST 1 VIEW COMPARISON:  One-view chest x-ray 10/22/2018 FINDINGS: The heart size and mediastinal contours are within normal limits. Both lungs are clear. The visualized skeletal structures are unremarkable. IMPRESSION: Negative one-view chest x-ray Electronically Signed   By: San Morelle M.D.   On: 11/13/2018 13:55     ASSESSMENT AND PLAN:   Patient is an 85 year old female with history of diabetes mellitus, prior history of sacral osteomyelitis previously treated with IV antibiotics via PICC line, COPD, hypertension, hyperlipidemia who is bedbound at baseline was brought in from nursing home today with complaints of nausea and vomiting.  Noted to have purulent drainage from sacral decubitus ulcer.  Diagnosed with severe sepsis secondary to infected sacral  decubitus ulcer.   * Severe sepsis secondary  to infected sacral decubitus ulcer osteomyelitis of coccyx On IV antibiotics.  Discussed with Dr. Perrin Maltese.  Poor prognosis and not a surgical candidate.  He has discussed with family.  2.  Acute kidney injury Secondary to sepsis.  On IV fluids.  3.  Diabetes mellitus type 2 Hold metformin.  Placed on sliding scale insulin coverage.    4.  Hypertension Blood pressure stable at this time.  Monitor clinically.  5.  Severe protein calorie malnutrition  DVT prophylaxis; heparin  All the records are reviewed and case discussed with Care Management/Social Workerr. Management plans discussed with the patient, family and they are in agreement.  CODE STATUS: DNR/DNI  DVT Prophylaxis: SCDs  TOTAL TIME TAKING CARE OF THIS PATIENT: 35 minutes.   POSSIBLE D/C IN 1-2 DAYS, DEPENDING ON CLINICAL CONDITION.  Leia Alf Giulio Bertino M.D on 11/14/2018 at 2:35 PM  Between 7am to 6pm - Pager - [phone]  After 6pm go to [email or web address] - password [email or web address] [facility]  Office  [phone]  CC: Primary care physician; [name], MD  Note: This dictation was prepared with Dragon dictation along with smaller phrase technology. Any transcriptional errors that result from this process are unintentional.

## 2018-11-14 NOTE — Progress Notes (Signed)
Pt complaining of nausea. Primary nurse paged and spoke to [employer] NP. Orders received for Zofran. Primary nurse to continue to monitor.

## 2018-11-14 NOTE — Consult Note (Signed)
Patient ID: Monica [name], female   DOB: May 06, 1930, 85 y.o.   MRN: [id number]  HPI Monica [name] is a 85 y.o. female onto her service with a history of sacral decubitus ulcer with osteomyelitis.  She does have significant comorbidities including diabetes, COPD, hypertension and is bed bound for the last few months.  Is back for failure to thrive and evidence of leukocytosis and hypothermia with hypotension consistent with sepsis.  There has been an increase in the drainage of the decubitus ulcer.  Has been started on broad-spectrum antibiotics and I was asked in consultation regarding the decubitus ulcer. She is able to answer simple questions.  She is somnolent but is able to respond to commands and questions when I spoke to her with a loud voice.  Denied she continues to be hypothermic and with soft blood pressures. Laboratory data reviewed showing a creatinine of 2.56 with a BUN of 84 and albumin of 1.7.  White count is 25,000 with a hemoglobin of 10.2.  INR now is 1.4.  I Have personally reviewed her chest x-ray showing no acute abnormalities.  HPI  Past Medical History:  Diagnosis Date  . B12 deficiency   . COPD (chronic obstructive pulmonary disease) ([place])   . Diabetes mellitus   . H/O: Bell's palsy   . Hyperlipidemia   . Hypertension   . Lung mass April 2011   s/p biopsy, no cancer cells found, Repeat CT Dec 2012 unchanged  . Osteomyelitis [facility])     Past Surgical History:  Procedure Laterality Date  . ABDOMINAL HYSTERECTOMY    . APPLICATION OF WOUND VAC N/A 10/16/2018   Procedure: APPLICATION OF WOUND VAC;  Surgeon: [name], MD;  Location: [facility] ORS;  Service: General;  Laterality: N/A;  . CHOLECYSTECTOMY    . INCISION AND DRAINAGE OF WOUND N/A 10/16/2018   Procedure: IRRIGATION AND DEBRIDEMENT WOUND;  Surgeon: [name], MD;  Location: [facility] ORS;  Service: General;  Laterality: N/A;  . LUNG BIOPSY  2012   for positive PET, negative biopsy for malignancy  . SPINE  SURGERY  2001   Lumbar    Family History  Problem Relation Age of Onset  . Cancer Mother 15       Breast  . Diabetes Mother   . Heart disease Father 3       Acute MI  . Heart disease Sister   . Arthritis Sister   . Heart disease Brother     Social History Social History   Tobacco Use  . Smoking status: Former Smoker    Packs/day: 0.50    Years: 5.00    Pack years: 2.50    Types: Cigarettes  . Smokeless tobacco: Never Used  . Tobacco comment: quit approx 2016  Substance Use Topics  . Alcohol use: No  . Drug use: No    No Known Allergies  Current Facility-Administered Medications  Medication Dose Route Frequency Provider Last Rate Last Dose  . 0.45 % sodium chloride infusion   Intravenous Continuous Sudini, Alveta Heimlich, MD      . acetaminophen (TYLENOL) tablet 650 mg  650 mg Oral Q6H PRN Ojie, Jude, MD      . acidophilus (RISAQUAD) capsule 1 capsule  1 capsule Oral Daily Ojie, Jude, MD      . ceFEPIme (MAXIPIME) 2 g in sodium chloride 0.9 % 100 mL IVPB  2 g Intravenous Q24H Stark Jock, Jude, MD   Stopped at 11/13/18 1629  . collagenase (SANTYL) ointment  Topical Daily Ojie, Jude, MD      . docusate sodium (COLACE) capsule 100 mg  100 mg Oral BID Ojie, Jude, MD      . heparin injection 5,000 Units  5,000 Units Subcutaneous Q8H Ojie, Jude, MD   5,000 Units at 11/14/18 0646  . insulin aspart (novoLOG) injection 0-5 Units  0-5 Units Subcutaneous QHS Ojie, Jude, MD      . insulin aspart (novoLOG) injection 0-9 Units  0-9 Units Subcutaneous TID WC Stark Jock, Jude, MD   2 Units at 11/13/18 1830  . lamoTRIgine (LAMICTAL) tablet 50 mg  50 mg Oral QHS Ojie, Jude, MD      . metoprolol succinate (TOPROL-XL) 24 hr tablet 50 mg  50 mg Oral Daily Ojie, Jude, MD      . multivitamin with minerals tablet 1 tablet  1 tablet Oral Daily Ojie, Jude, MD      . mupirocin ointment (BACTROBAN) 2 % 1 application  1 application Nasal BID Ojie, Jude, MD      . ondansetron (ZOFRAN) injection 4 mg  4 mg  Intravenous Q8H PRN Seals, Angela H, NP      . oxyCODONE (Oxy IR/ROXICODONE) immediate release tablet 5 mg  5 mg Oral Q6H PRN Ojie, Jude, MD      . protein supplement (ENSURE MAX) liquid  11 oz Oral BID BM Ojie, Jude, MD      . sertraline (ZOLOFT) tablet 50 mg  50 mg Oral Daily Ojie, Jude, MD   50 mg at 11/13/18 1550     Review of Systems Full ROS  was asked and was negative except for the information on the HPI  Physical Exam Blood pressure (!) 94/54, pulse 80, temperature (!) 97.4 F (36.3 C), temperature source Rectal, resp. rate (!) 22, height 5\' 7"  (1.702 m), weight 70.5 kg, SpO2 100 %. CONSTITUTIONAL: Debilitated and fragile female in NAD EYES: Pupils are equal, round, and reactive to light, Sclera are non-icteric. EARS, NOSE, MOUTH AND THROAT: The oropharynx is clear. The oral mucosa is pink and moist. Hearing is intact to voice. LYMPH NODES:  Lymph nodes in the neck are normal. RESPIRATORY:  Lungs are clear. There is normal respiratory effort, with equal breath sounds bilaterally, and without pathologic use of accessory muscles. CARDIOVASCULAR: Heart is regular without murmurs, gallops, or rubs. GI: The abdomen is  soft, nontender, and nondistended. There are no palpable masses. There is no hepatosplenomegaly. There are normal bowel sounds in all quadrants. GU: Rectal deferred.   MUSCULOSKELETAL: Normal muscle strength and tone. No cyanosis or edema.   SKIN: LArge sacral decubitus measuring 18 x 10 cms w fibrinous exudate on the medial site there is a 1 cm rim of necrosis. Done is exposed. No un drained pockets, other good granulation tissue. No evidence of necrotizing infection NEUROLOGIC: Motor and sensation is grossly normal. Cranial nerves are grossly intact. PSYCH:  Oriented to person, place and time. Affect is normal.  Data Reviewed  I have personally reviewed the patient's imaging, laboratory findings and medical records.    Assessment/Plan   85 year old female with  failure to thrive sepsis in a bedbound patient who is debilitated, malnourished with diabetes and overall decline in her overall health with significant fragility.  This overall carries a very poor prognosis.  I had had an extensive discussion with both the patient and her granddaughter "Lattie Haw ".  They wish to be DNR/DNI at this time.  And they do not want any more aggressive measures.  Given  the COVID-19 epidemic this poses significant issues because if she is hospitalized she will not be able to spend any quality of life with her loved ones during her last days.  I have raised this issues to both the patient and the daughter and they want to pursue palliative care and hospice.  I have placed an order for hospice/palliative care consultation.  Currently given the events and the circumstances I do not think she will benefit from aggressive debridement in the OR, sedated issues.  We will continue to treat locally with Santyl twice daily and change positioning every 2 hours and try to optimize her nutrition if at all possible.  As I stated above she has very poor prognosis and will likely die soon. We will be available.  time spent with the patient was 90 minutes, with more than 50% of the time spent in face-to-face education, counseling and care coordination.     Caroleen Hamman, MD FACS General Surgeon 11/14/2018, 9:10 AM

## 2018-11-15 LAB — URINALYSIS, ROUTINE W REFLEX MICROSCOPIC
Bilirubin Urine: NEGATIVE
Glucose, UA: NEGATIVE mg/dL
Ketones, ur: NEGATIVE mg/dL
Leukocytes,Ua: NEGATIVE
Nitrite: NEGATIVE
Protein, ur: NEGATIVE mg/dL
Specific Gravity, Urine: 1.01 (ref 1.005–1.030)
pH: 5 (ref 5.0–8.0)

## 2018-11-15 LAB — URINE CULTURE: Culture: 80000 — AB

## 2018-11-15 LAB — GLUCOSE, CAPILLARY
Glucose-Capillary: 90 mg/dL (ref 70–99)
Glucose-Capillary: 135 mg/dL — ABNORMAL HIGH (ref 70–99)
Glucose-Capillary: 142 mg/dL — ABNORMAL HIGH (ref 70–99)
Glucose-Capillary: 139 mg/dL — ABNORMAL HIGH (ref 70–99)

## 2018-11-15 LAB — CREATININE, SERUM
Creatinine, Ser: 2.48 mg/dL — ABNORMAL HIGH (ref 0.44–1.00)
GFR calc Af Amer: 20 mL/min — ABNORMAL LOW (ref >60)
GFR calc non Af Amer: 17 mL/min — ABNORMAL LOW (ref >60)

## 2018-11-15 NOTE — Consult Note (Signed)
Pharmacy Antibiotic Note  Monica [name] is a 85 y.o. female admitted on 11/13/2018 with sepsis.  Pharmacy has been consulted for cefepime and vancomcyin  dosing.  Plan: Continue vancomycin 750mg  IV Q48hr. Serum creatinine ordered with am labs.   Continue cefepime 2 g q24h.   Height: 5\' 7"  (170.2 cm) Weight: 155 lb 6.8 oz (70.5 kg) IBW/kg (Calculated) : 61.6  Temp (24hrs), Avg:97.7 F (36.5 C), Min:97.5 F (36.4 C), Max:97.9 F (36.6 C)  Recent Labs  Lab 11/13/18 1241 11/13/18 1242 11/13/18 1545 11/14/18 0438 11/14/18 1244  WBC 32.5*  --   --  25.1*  --   CREATININE 2.99*  --   --  2.56*  --   LATICACIDVEN  --  3.1* 1.8  --   --   VANCORANDOM  --   --   --   --  18    Estimated Creatinine Clearance: 15.3 mL/min (A) (by C-G formula based on SCr of 2.56 mg/dL (H)).    No Known Allergies  Antimicrobials this admission: Cefepime 5/22 >>  Vancomycin 5/22 >>   Dose adjustments this admission: None  Microbiology results:  5/22 BCx: no growth x 2 days   5/22 UCx: reincubated for better growth 5/23 MRSA: negative  5/22 COVID: negative   Thank you for allowing pharmacy to be a part of this patient's care.  Simpson,Michael L, PharmD, BCPS 11/15/2018 2:03 PM

## 2018-11-15 NOTE — Progress Notes (Signed)
[name] at [place] NAME: Monica [name]    MR#:  [id number]  DATE OF BIRTH:  Jul 18, 1929  SUBJECTIVE:  CHIEF COMPLAINT:   Chief Complaint  Patient presents with  . Abnormal Lab   Feels weak. Poor appetite.  REVIEW OF SYSTEMS:    Review of Systems  Constitutional: Positive for malaise/fatigue. Negative for chills and fever.  HENT: Negative for sore throat.   Eyes: Negative for blurred vision, double vision and pain.  Respiratory: Negative for cough, hemoptysis, shortness of breath and wheezing.   Cardiovascular: Negative for chest pain, palpitations, orthopnea and leg swelling.  Gastrointestinal: Negative for abdominal pain, constipation, diarrhea, heartburn, nausea and vomiting.  Genitourinary: Negative for dysuria and hematuria.  Musculoskeletal: Positive for back pain. Negative for joint pain.  Skin: Negative for rash.  Neurological: Negative for sensory change, speech change, focal weakness and headaches.  Endo/Heme/Allergies: Does not bruise/bleed easily.  Psychiatric/Behavioral: Negative for depression. The patient is not nervous/anxious.     DRUG ALLERGIES:  No Known Allergies  VITALS:  Blood pressure 114/65, pulse (!) 53, temperature 97.6 F (36.4 C), resp. rate 19, height 5\' 7"  (1.702 m), weight 70.5 kg, SpO2 92 %.  PHYSICAL EXAMINATION:   Physical Exam  GENERAL:  85 y.o.-year-old patient lying in the bed with no acute distress.  EYES: Pupils equal, round, reactive to light and accommodation. No scleral icterus. Extraocular muscles intact.  HEENT: Head atraumatic, normocephalic. Oropharynx and nasopharynx clear.  NECK:  Supple, no jugular venous distention. No thyroid enlargement, no tenderness.  LUNGS: Normal breath sounds bilaterally, no wheezing, rales, rhonchi. No use of accessory muscles of respiration.  CARDIOVASCULAR: S1, S2 normal. No murmurs, rubs, or gallops.  ABDOMEN: Soft, nontender, nondistended. Bowel sounds  present. No organomegaly or mass.  EXTREMITIES: No cyanosis, clubbing or edema b/l.    NEUROLOGIC: Cranial nerves II through XII are intact.  PSYCHIATRIC: The patient is alert and oriented x 3.  SKIN: large sacral wound 10 x 5 cm unstageable with necrosis   LABORATORY PANEL:   CBC Recent Labs  Lab 11/14/18 0438  WBC 25.1*  HGB 10.2*  HCT 32.7*  PLT 286   ------------------------------------------------------------------------------------------------------------------ Chemistries  Recent Labs  Lab 11/14/18 0438  NA 144  K 4.1  CL 117*  CO2 14*  GLUCOSE 111*  BUN 84*  CREATININE 2.56*  CALCIUM 7.6*  MG 1.8  AST 22  ALT 32  ALKPHOS 122  BILITOT 0.7   ------------------------------------------------------------------------------------------------------------------  Cardiac Enzymes No results for input(s): TROPONINI in the last 168 hours. ------------------------------------------------------------------------------------------------------------------  RADIOLOGY:  No results found.   ASSESSMENT AND PLAN:   Patient is an 85 year old female with history of diabetes mellitus, prior history of sacral osteomyelitis previously treated with IV antibiotics via PICC line, COPD, hypertension, hyperlipidemia who is bedbound at baseline was brought in from nursing home today with complaints of nausea and vomiting.  Noted to have purulent drainage from sacral decubitus ulcer.  Diagnosed with severe sepsis secondary to infected sacral decubitus ulcer.   * Severe sepsis secondary to infected sacral decubitus ulcer osteomyelitis of coccyx On IV antibiotics.  Discussed with Dr. Dahlia Byes.  Poor prognosis and not a surgical candidate.  He has discussed with family. Continue IV antibiotics.  Repeat labs in the morning.  Consult palliative care for goals of care discussion.  *  Acute kidney injury Secondary to sepsis.  On IV fluids.  *  Diabetes mellitus type 2 Hold metformin.   Placed on sliding  scale insulin coverage.    *  Hypertension Blood pressure stable at this time.  Monitor clinically.  *  Severe protein calorie malnutrition  DVT prophylaxis; heparin  All the records are reviewed and case discussed with Care Management/Social Workerr. Management plans discussed with the patient, family and they are in agreement.  CODE STATUS: DNR/DNI  DVT Prophylaxis: SCDs  TOTAL TIME TAKING CARE OF THIS PATIENT: 35 minutes.   POSSIBLE D/C IN 1-2 DAYS, DEPENDING ON CLINICAL CONDITION.  [name] M.D on 11/15/2018 at 1:51 PM  Between 7am to 6pm - Pager - [phone]  After 6pm go to [email or web address] - password [email or web address] [facility]  Office  [phone]  CC: Primary care physician; Monica Mc, MD  Note: This dictation was prepared with Dragon dictation along with smaller phrase technology. Any transcriptional errors that result from this process are unintentional.

## 2018-11-15 NOTE — Progress Notes (Signed)
Order received to order a specialty bed for the patient

## 2018-11-16 DIAGNOSIS — A419 Sepsis, unspecified organism: Principal | ICD-10-CM

## 2018-11-16 DIAGNOSIS — Z515 Encounter for palliative care: Secondary | ICD-10-CM

## 2018-11-16 DIAGNOSIS — N179 Acute kidney failure, unspecified: Secondary | ICD-10-CM

## 2018-11-16 DIAGNOSIS — Z7189 Other specified counseling: Secondary | ICD-10-CM

## 2018-11-16 DIAGNOSIS — R6521 Severe sepsis with septic shock: Secondary | ICD-10-CM

## 2018-11-16 LAB — CBC WITH DIFFERENTIAL/PLATELET
Abs Immature Granulocytes: 0.46 10*3/uL — ABNORMAL HIGH (ref 0.00–0.07)
Basophils Absolute: 0 10*3/uL (ref 0.0–0.1)
Basophils Relative: 0 %
Eosinophils Absolute: 0.1 10*3/uL (ref 0.0–0.5)
Eosinophils Relative: 0 %
HCT: 28.2 % — ABNORMAL LOW (ref 36.0–46.0)
Hemoglobin: 8.8 g/dL — ABNORMAL LOW (ref 12.0–15.0)
Immature Granulocytes: 3 %
Lymphocytes Relative: 15 %
Lymphs Abs: 2 10*3/uL (ref 0.7–4.0)
MCH: 27.2 pg (ref 26.0–34.0)
MCHC: 31.2 g/dL (ref 30.0–36.0)
MCV: 87 fL (ref 80.0–100.0)
Monocytes Absolute: 1 10*3/uL (ref 0.1–1.0)
Monocytes Relative: 7 %
Neutro Abs: 10.1 10*3/uL — ABNORMAL HIGH (ref 1.7–7.7)
Neutrophils Relative %: 75 %
Platelets: 227 10*3/uL (ref 150–400)
RBC: 3.24 MIL/uL — ABNORMAL LOW (ref 3.87–5.11)
RDW: 18.9 % — ABNORMAL HIGH (ref 11.5–15.5)
WBC: 13.6 10*3/uL — ABNORMAL HIGH (ref 4.0–10.5)
nRBC: 0 % (ref 0.0–0.2)

## 2018-11-16 LAB — BASIC METABOLIC PANEL
Anion gap: 8 (ref 5–15)
BUN: 83 mg/dL — ABNORMAL HIGH (ref 8–23)
CO2: 16 mmol/L — ABNORMAL LOW (ref 22–32)
Calcium: 7.7 mg/dL — ABNORMAL LOW (ref 8.9–10.3)
Chloride: 113 mmol/L — ABNORMAL HIGH (ref 98–111)
Creatinine, Ser: 2.38 mg/dL — ABNORMAL HIGH (ref 0.44–1.00)
GFR calc Af Amer: 21 mL/min — ABNORMAL LOW (ref >60)
GFR calc non Af Amer: 18 mL/min — ABNORMAL LOW (ref >60)
Glucose, Bld: 119 mg/dL — ABNORMAL HIGH (ref 70–99)
Potassium: 3.6 mmol/L (ref 3.5–5.1)
Sodium: 137 mmol/L (ref 135–145)

## 2018-11-16 LAB — GLUCOSE, CAPILLARY
Glucose-Capillary: 104 mg/dL — ABNORMAL HIGH (ref 70–99)
Glucose-Capillary: 127 mg/dL — ABNORMAL HIGH (ref 70–99)
Glucose-Capillary: 97 mg/dL (ref 70–99)
Glucose-Capillary: 138 mg/dL — ABNORMAL HIGH (ref 70–99)

## 2018-11-16 MED ORDER — VANCOMYCIN HCL IN DEXTROSE 1-5 GM/200ML-% IV SOLN
1000.0000 mg | INTRAVENOUS | Status: DC
  Administered 2018-11-17: 1000 mg via INTRAVENOUS
  Filled 2018-11-16 (×2): qty 200

## 2018-11-16 NOTE — Care Management Important Message (Signed)
Important Message  Patient Details  Name: Monica [name] MRN: [id number] Date of Birth: 1929-09-05   Medicare Important Message Given:  Yes    Dannette Barbara 11/16/2018, 2:46 PM

## 2018-11-16 NOTE — Consult Note (Signed)
Pharmacy Antibiotic Note  Monica [name] is a 85 y.o. female admitted on 11/13/2018 with sepsis/infected decub ulcer.  Pharmacy has been consulted for cefepime and vancomcyin  dosing.  Plan: Continue vancomycin 750mg  IV Q48hr. Serum creatinine ordered with am labs.  Continue cefepime 2 g q24h.   5/25:  Scr improved 2.56>2.48>2.38. Will adjust dose to Vancomycin 1 gram IV q48hr-dose due today 5/25. AUC 544.    Height: 5\' 7"  (170.2 cm) Weight: 155 lb 6.8 oz (70.5 kg) IBW/kg (Calculated) : 61.6  Temp (24hrs), Avg:97.4 F (36.3 C), Min:96.9 F (36.1 C), Max:97.7 F (36.5 C)  Recent Labs  Lab 11/13/18 1241 11/13/18 1242 11/13/18 1545 11/14/18 0438 11/14/18 1244 11/15/18 1512 11/16/18 0433  WBC 32.5*  --   --  25.1*  --   --  13.6*  CREATININE 2.99*  --   --  2.56*  --  2.48* 2.38*  LATICACIDVEN  --  3.1* 1.8  --   --   --   --   VANCORANDOM  --   --   --   --  18  --   --     Estimated Creatinine Clearance: 16.5 mL/min (A) (by C-G formula based on SCr of 2.38 mg/dL (H)).    No Known Allergies  Antimicrobials this admission: Cefepime 5/22 >>  Vancomycin 5/22 >>   Dose adjustments this admission: 5/25 Vanc 750 q48h to 1 gm q48h  Microbiology results:  5/22 BCx: no growth x 2 days   5/22 UCx: reincubated for better growth 5/23 MRSA: negative  5/22 COVID: negative   Thank you for allowing pharmacy to be a part of this patient's care.  Noralee Space, PharmD, BCPS 11/16/2018 3:18 PM

## 2018-11-16 NOTE — Consult Note (Signed)
Consultation Note Date: 11/16/2018   Patient Name: Monica [name]  DOB: March 25, 1930  MRN: [id number]  Age / Sex: 85 y.o., female  PCP: [name], MD Referring Physician: [name], MD  Reason for Consultation: Establishing goals of care  HPI/Patient Profile: Monica [name]  is a 85 y.o. female with a known history of diabetes mellitus, prior history of sacral osteomyelitis previously treated with IV antibiotics via PICC line, COPD, hypertension, hyperlipidemia who is bedbound at baseline was brought in from nursing home today with complaints of nausea and vomiting.  Clinical Assessment and Goals of Care: Met with this patient last admission. Patient is resting in bed. She states she has been living in a facility because her family cannot provide the care she needs. She states she has not been eating well. She answers all CAOx4 questions correctly. She states her full name, that she is in [place], that she is at the hospital, that the year is 2020, Trump is president, and that she is here because of a sore on her butt.   We discussed quality of life and suffering. Discussed possible future recurrent hospitalizations, issues with the wound, poor healing and possible worsening of the wound, and abx therapy. We discussed her poor oral intake since last admission when we discussed the importance of nutrition.    She states she has spoke with the doctor about the sacral wound. She states she understands her poor prognosis. She states "but I want to live." "I can start eating. I can try. I don't want to die" We discussed her mobility and she states she will try to offload weight more often by rolling. She states she wants any additional care possible for her wound. She states if her wound isn't treated she will die, and she would like to try.    She states she is not sure, but she does not believe she would not want  a ventilator as she would not want her children to have to decide to remove it. She states she would like a natural death and confirms DNR status.   Spoke with Lattie Haw. She states she has been updated on her mother's status and poor prognosis. She states her mother has not been impressed with her current facility. She would like for her to come home with hospice to live with her husband, Silvio Pate and Joseph Art, or to go to hospice facility. She does not want her mother to think her family is giving up on her. She states she has not been able to see her since last admission when I saw her. Lattie Haw states her mother's cognitive status comes and goes where she is Drago Hammonds clear one minute and very confused the next.       SUMMARY OF RECOMMENDATIONS   If question, could recommend psych consult to determine capacity. She is a hospice candidate.  Plans to speak to patient again in the morning and the daughter.    Code Status/Advance Care Planning:  DNR  Prognosis:   Poor  Discharge  Planning: To Be Determined      Primary Diagnoses: Present on Admission: . Sepsis ([place])   I have reviewed the medical record, interviewed the patient and family, and examined the patient. The following aspects are pertinent.  Past Medical History:  Diagnosis Date  . B12 deficiency   . COPD (chronic obstructive pulmonary disease) ([facility])   . Diabetes mellitus   . H/O: Bell's palsy   . Hyperlipidemia   . Hypertension   . Lung mass April 2011   s/p biopsy, no cancer cells found, Repeat CT Dec 2012 unchanged  . Osteomyelitis [facility])    Social History   Socioeconomic History  . Marital status: Widowed    Spouse name: Not on file  . Number of children: Not on file  . Years of education: Not on file  . Highest education level: Not on file  Occupational History  . Not on file  Social Needs  . Financial resource strain: Not very hard  . Food insecurity:    Worry: Patient refused    Inability: Patient refused  .  Transportation needs:    Medical: Patient refused    Non-medical: Patient refused  Tobacco Use  . Smoking status: Former Smoker    Packs/day: 0.50    Years: 5.00    Pack years: 2.50    Types: Cigarettes  . Smokeless tobacco: Never Used  . Tobacco comment: quit approx 2016  Substance and Sexual Activity  . Alcohol use: No  . Drug use: No  . Sexual activity: Not Currently  Lifestyle  . Physical activity:    Days per week: Patient refused    Minutes per session: Patient refused  . Stress: Only a little  Relationships  . Social connections:    Talks on phone: Patient refused    Gets together: Patient refused    Attends religious service: Patient refused    Active member of club or organization: Patient refused    Attends meetings of clubs or organizations: Patient refused    Relationship status: Patient refused  Other Topics Concern  . Not on file  Social History Narrative  . Not on file   Family History  Problem Relation Age of Onset  . Cancer Mother 62       Breast  . Diabetes Mother   . Heart disease Father 58       Acute MI  . Heart disease Sister   . Arthritis Sister   . Heart disease Brother    Scheduled Meds: . acidophilus  1 capsule Oral Daily  . collagenase   Topical BID  . docusate sodium  100 mg Oral BID  . heparin injection (subcutaneous)  5,000 Units Subcutaneous Q8H  . insulin aspart  0-5 Units Subcutaneous QHS  . insulin aspart  0-9 Units Subcutaneous TID WC  . lamoTRIgine  50 mg Oral QHS  . metoprolol succinate  50 mg Oral Daily  . multivitamin with minerals  1 tablet Oral Daily  . Ensure Max Protein  11 oz Oral BID BM  . sertraline  50 mg Oral Daily   Continuous Infusions: . sodium chloride 100 mL/hr at 11/16/18 1201  . ceFEPime (MAXIPIME) IV Stopped (11/15/18 1734)  . vancomycin Stopped (11/14/18 1852)   PRN Meds:.acetaminophen, ondansetron (ZOFRAN) IV, oxyCODONE Medications Prior to Admission:  Prior to Admission medications   Medication  Sig Start Date End Date Taking? Authorizing Provider  ACCU-CHEK GUIDE test strip USE AS DIRECTED TWICE A DAY 11/11/18  Yes [name],  MD  acetaminophen (TYLENOL) 325 MG tablet Take 2 tablets (650 mg total) by mouth every 6 (six) hours as needed for mild pain (or Fever >/= 101). 10/20/18  Yes Wieting, Richard, MD  collagenase (SANTYL) ointment Apply topically daily. 10/21/18  Yes Wieting, Richard, MD  docusate sodium (COLACE) 100 MG capsule Take 1 capsule (100 mg total) by mouth 2 (two) times daily. Patient taking differently: Take 100 mg by mouth every 8 (eight) hours as needed for mild constipation.  10/20/18  Yes Wieting, Delfino Lovett, MD  Ensure Max Protein (ENSURE MAX PROTEIN) LIQD Take 330 mLs (11 oz total) by mouth 2 (two) times daily between meals. 10/20/18  Yes Wieting, Richard, MD  lamoTRIgine (LAMICTAL) 25 MG tablet Take 2 tablets (50 mg total) by mouth at bedtime. 10/20/18  Yes Loletha Grayer, MD  metFORMIN (GLUCOPHAGE) 500 MG tablet Take 1 tablet (500 mg total) by mouth 2 (two) times daily with a meal. 10/20/18  Yes Wieting, Richard, MD  metoprolol succinate (TOPROL-XL) 50 MG 24 hr tablet Take 1 tablet (50 mg total) by mouth daily. Take with or immediately following a meal. 10/21/18  Yes Wieting, Richard, MD  multivitamin-lutein [facility]) CAPS capsule Take 1 capsule by mouth daily. 10/21/18  Yes Wieting, Richard, MD  oxyCODONE (OXY IR/ROXICODONE) 5 MG immediate release tablet Take 1 tablet (5 mg total) by mouth every 6 (six) hours as needed for moderate pain or severe pain. 10/26/18  Yes Gladstone Lighter, MD  Probiotic Product (PROBIOTIC DAILY) CAPS Take 1 capsule by mouth daily. 10/26/18  Yes Gladstone Lighter, MD  sertraline (ZOLOFT) 50 MG tablet Take 1 tablet (50 mg total) by mouth daily. 09/25/18  Yes [name], MD   No Known Allergies Review of Systems  Skin:       Pain at her lower back.     Physical Exam Pulmonary:     Effort: Pulmonary effort is normal.  Neurological:      Mental Status: She is alert.     Vital Signs: BP (!) 91/57 (BP Location: Left Arm)   Pulse 61   Temp (!) 96.9 F (36.1 C) (Axillary)   Resp 16   Ht '5\' 7"'  (1.702 m)   Wt 70.5 kg   SpO2 100%   BMI 24.34 kg/m  Pain Scale: Faces   Pain Score: 0-No pain   SpO2: SpO2: 100 % O2 Device:SpO2: 100 % O2 Flow Rate: .   IO: Intake/output summary:   Intake/Output Summary (Last 24 hours) at 11/16/2018 1402 Last data filed at 11/16/2018 0900 Gross per 24 hour  Intake 1013.33 ml  Output 1300 ml  Net -286.67 ml    LBM: Last BM Date: 11/14/18 Baseline Weight: Weight: 70.5 kg Most recent weight: Weight: 70.5 kg     Palliative Assessment/Data:     Time In: 1:55 Time Out: 2:45 Time Total: 50 min Greater than 50%  of this time was spent counseling and coordinating care related to the above assessment and plan.  Signed by: Asencion Gowda, NP   Please contact Palliative Medicine Team phone at [phone] for questions and concerns.  For individual provider: See Shea Evans

## 2018-11-16 NOTE — Progress Notes (Signed)
[facility] [place] NAME: Monica [name]    MR#:  [id number]  DATE OF BIRTH:  08/01/1929  SUBJECTIVE:  CHIEF COMPLAINT:   Chief Complaint  Patient presents with  . Abnormal Lab   Afebrile.  Poor appetite.  Laying in bed.  REVIEW OF SYSTEMS:    Review of Systems  Constitutional: Positive for malaise/fatigue. Negative for chills and fever.  HENT: Negative for sore throat.   Eyes: Negative for blurred vision, double vision and pain.  Respiratory: Negative for cough, hemoptysis, shortness of breath and wheezing.   Cardiovascular: Negative for chest pain, palpitations, orthopnea and leg swelling.  Gastrointestinal: Negative for abdominal pain, constipation, diarrhea, heartburn, nausea and vomiting.  Genitourinary: Negative for dysuria and hematuria.  Musculoskeletal: Positive for back pain. Negative for joint pain.  Skin: Negative for rash.  Neurological: Negative for sensory change, speech change, focal weakness and headaches.  Endo/Heme/Allergies: Does not bruise/bleed easily.  Psychiatric/Behavioral: Negative for depression. The patient is not nervous/anxious.     DRUG ALLERGIES:  No Known Allergies  VITALS:  Blood pressure (!) 91/57, pulse 68, temperature (!) 96.9 F (36.1 C), temperature source Axillary, resp. rate 16, height 5\' 7"  (1.702 m), weight 70.5 kg, SpO2 100 %.  PHYSICAL EXAMINATION:   Physical Exam  GENERAL:  85 y.o.-year-old patient lying in the bed with no acute distress.  EYES: Pupils equal, round, reactive to light and accommodation. No scleral icterus. Extraocular muscles intact.  HEENT: Head atraumatic, normocephalic. Oropharynx and nasopharynx clear.  NECK:  Supple, no jugular venous distention. No thyroid enlargement, no tenderness.  LUNGS: Normal breath sounds bilaterally, no wheezing, rales, rhonchi. No use of accessory muscles of respiration.  CARDIOVASCULAR: S1, S2 normal. No murmurs, rubs, or gallops.   ABDOMEN: Soft, nontender, nondistended. Bowel sounds present. No organomegaly or mass.  EXTREMITIES: No cyanosis, clubbing or edema b/l.    NEUROLOGIC: Cranial nerves II through XII are intact.  PSYCHIATRIC: The patient is alert and oriented x 3.  SKIN: large sacral wound 10 x 5 cm unstageable with necrosis   LABORATORY PANEL:   CBC Recent Labs  Lab 11/16/18 0433  WBC 13.6*  HGB 8.8*  HCT 28.2*  PLT 227   ------------------------------------------------------------------------------------------------------------------ Chemistries  Recent Labs  Lab 11/14/18 0438  11/16/18 0433  NA 144  --  137  K 4.1  --  3.6  CL 117*  --  113*  CO2 14*  --  16*  GLUCOSE 111*  --  119*  BUN 84*  --  83*  CREATININE 2.56*   < > 2.38*  CALCIUM 7.6*  --  7.7*  MG 1.8  --   --   AST 22  --   --   ALT 32  --   --   ALKPHOS 122  --   --   BILITOT 0.7  --   --    < > = values in this interval not displayed.   ------------------------------------------------------------------------------------------------------------------  Cardiac Enzymes No results for input(s): TROPONINI in the last 168 hours. ------------------------------------------------------------------------------------------------------------------  RADIOLOGY:  No results found.   ASSESSMENT AND PLAN:   Patient is an 85 year old female with history of diabetes mellitus, prior history of sacral osteomyelitis previously treated with IV antibiotics via PICC line, COPD, hypertension, hyperlipidemia who is bedbound at baseline was brought in from nursing home today with complaints of nausea and vomiting.  Noted to have purulent drainage from sacral decubitus ulcer.  Diagnosed with severe sepsis secondary to  infected sacral decubitus ulcer.   * Severe sepsis secondary to infected sacral decubitus ulcer and osteomyelitis of coccyx On IV antibiotics.  Discussed with Dr. Dahlia Byes.  Poor prognosis and not a surgical candidate.  He has  discussed with family. Continue IV antibiotics.  Repeat labs in the morning.  Consult palliative care for goals of care discussion.  Discussed with Asencion Gowda NP. initially patient requested aggressive surgery.  At this time however advised that she would be a candidate for hospice services and she wants to further discuss with her daughter.  *  Acute kidney injury Secondary to sepsis.  On IV fluids. Will increase IV fluids.  Repeat labs in the morning.  *  Diabetes mellitus type 2 Hold metformin.  Placed on sliding scale insulin coverage.    *  Hypertension Blood pressure stable at this time.  Monitor clinically.  *  Severe protein calorie malnutrition  DVT prophylaxis; heparin  All the records are reviewed and case discussed with Care Management/Social Workerr. Management plans discussed with the patient, family and they are in agreement.  CODE STATUS: DNR/DNI  DVT Prophylaxis: SCDs  TOTAL TIME TAKING CARE OF THIS PATIENT: 35 minutes.   POSSIBLE D/C IN 1-2 DAYS, DEPENDING ON CLINICAL CONDITION.  Monica [name] M.D on 11/16/2018 at 1:49 PM  Between 7am to 6pm - Pager - [phone]  After 6pm go to [email or web address] - password [email or web address] [facility]  Office  [phone]  CC: Primary care physician; [name], MD  Note: This dictation was prepared with Dragon dictation along with smaller phrase technology. Any transcriptional errors that result from this process are unintentional.

## 2018-11-17 ENCOUNTER — Telehealth: Payer: Self-pay | Admitting: Internal Medicine

## 2018-11-17 LAB — CBC WITH DIFFERENTIAL/PLATELET
Abs Immature Granulocytes: 0.46 10*3/uL — ABNORMAL HIGH (ref 0.00–0.07)
Basophils Absolute: 0 10*3/uL (ref 0.0–0.1)
Basophils Relative: 0 %
Eosinophils Absolute: 0.1 10*3/uL (ref 0.0–0.5)
Eosinophils Relative: 1 %
HCT: 27.6 % — ABNORMAL LOW (ref 36.0–46.0)
Hemoglobin: 8.4 g/dL — ABNORMAL LOW (ref 12.0–15.0)
Immature Granulocytes: 4 %
Lymphocytes Relative: 14 %
Lymphs Abs: 1.7 10*3/uL (ref 0.7–4.0)
MCH: 27.2 pg (ref 26.0–34.0)
MCHC: 30.4 g/dL (ref 30.0–36.0)
MCV: 89.3 fL (ref 80.0–100.0)
Monocytes Absolute: 0.9 10*3/uL (ref 0.1–1.0)
Monocytes Relative: 8 %
Neutro Abs: 9 10*3/uL — ABNORMAL HIGH (ref 1.7–7.7)
Neutrophils Relative %: 73 %
Platelets: 224 10*3/uL (ref 150–400)
RBC: 3.09 MIL/uL — ABNORMAL LOW (ref 3.87–5.11)
RDW: 19.3 % — ABNORMAL HIGH (ref 11.5–15.5)
WBC: 12.1 10*3/uL — ABNORMAL HIGH (ref 4.0–10.5)
nRBC: 0 % (ref 0.0–0.2)

## 2018-11-17 LAB — GLUCOSE, CAPILLARY
Glucose-Capillary: 132 mg/dL — ABNORMAL HIGH (ref 70–99)
Glucose-Capillary: 84 mg/dL (ref 70–99)
Glucose-Capillary: 101 mg/dL — ABNORMAL HIGH (ref 70–99)
Glucose-Capillary: 85 mg/dL (ref 70–99)

## 2018-11-17 LAB — BASIC METABOLIC PANEL
Anion gap: 7 (ref 5–15)
BUN: 75 mg/dL — ABNORMAL HIGH (ref 8–23)
CO2: 18 mmol/L — ABNORMAL LOW (ref 22–32)
Calcium: 7.6 mg/dL — ABNORMAL LOW (ref 8.9–10.3)
Chloride: 114 mmol/L — ABNORMAL HIGH (ref 98–111)
Creatinine, Ser: 2.21 mg/dL — ABNORMAL HIGH (ref 0.44–1.00)
GFR calc Af Amer: 23 mL/min — ABNORMAL LOW (ref >60)
GFR calc non Af Amer: 20 mL/min — ABNORMAL LOW (ref >60)
Glucose, Bld: 106 mg/dL — ABNORMAL HIGH (ref 70–99)
Potassium: 3.4 mmol/L — ABNORMAL LOW (ref 3.5–5.1)
Sodium: 139 mmol/L (ref 135–145)

## 2018-11-17 NOTE — Progress Notes (Signed)
Patient ID: Monica [name], female   DOB: 1930/06/19, 85 y.o.   MRN: [id number]  [facility] PROGRESS NOTE  Monica [name] NLZ:[id number] DOB: 03-Jul-1929 DOA: 11/13/2018 PCP: [name], MD  HPI/Subjective: Patient answers some yes or no questions.  Answered no to most questions.  Less talkative today than on previous hospitalizations.  Objective: Vitals:   11/17/18 0850 11/17/18 1401  BP: 117/79 (!) 122/53  Pulse: 63 64  Resp: 18 16  Temp: (!) 97.5 F (36.4 C) (!) 97.5 F (36.4 C)  SpO2: 100% 99%    Filed Weights   11/13/18 1204  Weight: 70.5 kg    ROS: Review of Systems  Constitutional: Negative for chills and fever.  Eyes: Negative for blurred vision.  Respiratory: Negative for cough and shortness of breath.   Cardiovascular: Negative for chest pain.  Gastrointestinal: Negative for abdominal pain, constipation, diarrhea, nausea and vomiting.  Genitourinary: Negative for dysuria.  Musculoskeletal: Negative for joint pain.  Neurological: Negative for dizziness and headaches.   Exam: Physical Exam  HENT:  Nose: No mucosal edema.  Mouth/Throat: No oropharyngeal exudate or posterior oropharyngeal edema.  Eyes: Pupils are equal, round, and reactive to light. Conjunctivae, EOM and lids are normal.  Neck: No JVD present. Carotid bruit is not present. No edema present. No thyroid mass and no thyromegaly present.  Cardiovascular: S1 normal and S2 normal. Exam reveals no gallop.  No murmur heard. Pulses:      Dorsalis pedis pulses are 2+ on the right side and 2+ on the left side.  Respiratory: No respiratory distress. She has no wheezes. She has no rhonchi. She has no rales.  GI: Soft. Bowel sounds are normal. There is no abdominal tenderness.  Musculoskeletal:     Right shoulder: She exhibits no swelling.  Lymphadenopathy:    She has no cervical adenopathy.  Neurological: She is alert. No cranial nerve deficit.  Skin: Skin is warm. No rash noted. Nails show no  clubbing.  Psychiatric: She has a normal mood and affect.      Data Reviewed: Basic Metabolic Panel: Recent Labs  Lab 11/13/18 1241 11/14/18 0438 11/15/18 1512 11/16/18 0433 11/17/18 0306  NA 140 144  --  137 139  K 5.0 4.1  --  3.6 3.4*  CL 109 117*  --  113* 114*  CO2 17* 14*  --  16* 18*  GLUCOSE 186* 111*  --  119* 106*  BUN 95* 84*  --  83* 75*  CREATININE 2.99* 2.56* 2.48* 2.38* 2.21*  CALCIUM 8.4* 7.6*  --  7.7* 7.6*  MG  --  1.8  --   --   --    Liver Function Tests: Recent Labs  Lab 11/13/18 1241 11/14/18 0438  AST 30 22  ALT 43 32  ALKPHOS 158* 122  BILITOT 0.4 0.7  PROT 6.0* 4.9*  ALBUMIN 2.0* 1.7*   CBC: Recent Labs  Lab 11/13/18 1241 11/14/18 0438 11/16/18 0433 11/17/18 0306  WBC 32.5* 25.1* 13.6* 12.1*  NEUTROABS 28.1*  --  10.1* 9.0*  HGB 11.8* 10.2* 8.8* 8.4*  HCT 38.0 32.7* 28.2* 27.6*  MCV 87.6 87.7 87.0 89.3  PLT 337 286 227 224    CBG: Recent Labs  Lab 11/16/18 1138 11/16/18 1644 11/16/18 2058 11/17/18 0736 11/17/18 1200  GLUCAP 127* 97 138* 132* 84    Recent Results (from the past 240 hour(s))  Blood Culture (routine x 2)     Status: None (Preliminary result)   Collection  Time: 11/13/18 12:41 PM  Result Value Ref Range Status   Specimen Description BLOOD R A  Final   Special Requests   Final    BOTTLES DRAWN AEROBIC AND ANAEROBIC Blood Culture results may not be optimal due to an excessive volume of blood received in culture bottles   Culture   Final    NO GROWTH 4 DAYS Performed at [facility], [address] [place], [place] [address]    Report Status PENDING  Incomplete  Blood Culture (routine x 2)     Status: None (Preliminary result)   Collection Time: 11/13/18 12:42 PM  Result Value Ref Range Status   Specimen Description BLOOD LH  Final   Special Requests   Final    BOTTLES DRAWN AEROBIC AND ANAEROBIC Blood Culture adequate volume   Culture   Final    NO GROWTH 4 DAYS Performed at [facility], [address] [place], [place] [address]    Report Status PENDING  Incomplete  SARS Coronavirus 2 (CEPHEID - Performed in [facility] hospital lab), Hosp Order     Status: None   Collection Time: 11/13/18 12:42 PM  Result Value Ref Range Status   SARS Coronavirus 2 NEGATIVE NEGATIVE Final    Comment: (NOTE) If result is NEGATIVE SARS-CoV-2 target nucleic acids are NOT DETECTED. The SARS-CoV-2 RNA is generally detectable in upper and lower  respiratory specimens during the acute phase of infection. The lowest  concentration of SARS-CoV-2 viral copies this assay can detect is 250  copies / mL. A negative result does not preclude SARS-CoV-2 infection  and should not be used as the sole basis for treatment or other  patient management decisions.  A negative result may occur with  improper specimen collection / handling, submission of specimen other  than nasopharyngeal swab, presence of viral mutation(s) within the  areas targeted by this assay, and inadequate number of viral copies  (<250 copies / mL). A negative result must be combined with clinical  observations, patient history, and epidemiological information. If result is POSITIVE SARS-CoV-2 target nucleic acids are DETECTED. The SARS-CoV-2 RNA is generally detectable in upper and lower  respiratory specimens dur ing the acute phase of infection.  Positive  results are indicative of active infection with SARS-CoV-2.  Clinical  correlation with patient history and other diagnostic information is  necessary to determine patient infection status.  Positive results do  not rule out bacterial infection or co-infection with other viruses. If result is PRESUMPTIVE POSTIVE SARS-CoV-2 nucleic acids MAY BE PRESENT.   A presumptive positive result was obtained on the submitted specimen  and confirmed on repeat testing.  While 2019 novel coronavirus  (SARS-CoV-2) nucleic acids may be present in the submitted sample   additional confirmatory testing may be necessary for epidemiological  and / or clinical management purposes  to differentiate between  SARS-CoV-2 and other Sarbecovirus currently known to infect humans.  If clinically indicated additional testing with an alternate test  methodology [id number]) is advised. The SARS-CoV-2 RNA is generally  detectable in upper and lower respiratory sp ecimens during the acute  phase of infection. The expected result is Negative. Fact Sheet for Patients:  StrictlyIdeas.no Fact Sheet for Healthcare Providers: BankingDealers.co.za This test is not yet approved or cleared by the [place] FDA and has been authorized for detection and/or diagnosis of SARS-CoV-2 by FDA under an Emergency Use Authorization (EUA).  This EUA will remain in effect (meaning this test can be used)  for the duration of the COVID-19 declaration under Section 564(b)(1) of the Act, 21 U.S.C. section 360bbb-3(b)(1), unless the authorization is terminated or revoked sooner. Performed at [facility], [address] [place], [place] [address]   Urine culture     Status: Abnormal   Collection Time: 11/13/18 12:42 PM  Result Value Ref Range Status   Specimen Description   Final    URINE, RANDOM Performed at [facility], [address] [place], [place] [address]    Special Requests   Final    NONE Performed at [facility], [place]  [address]    Culture 80,000 COLONIES/mL YEAST (A)  Final   Report Status 11/15/2018 FINAL  Final  Surgical PCR screen     Status: None   Collection Time: 11/14/18  6:49 AM  Result Value Ref Range Status   MRSA, PCR NEGATIVE NEGATIVE Final   Staphylococcus aureus NEGATIVE NEGATIVE Final    Comment: (NOTE) The Xpert SA Assay (FDA approved for NASAL specimens in patients 65 years of age and older), is one component of a comprehensive surveillance  program. It is not intended to diagnose infection nor to guide or monitor treatment. Performed at [facility], [address] [place], [place] [address]       Scheduled Meds: . acidophilus  1 capsule Oral Daily  . collagenase   Topical BID  . docusate sodium  100 mg Oral BID  . heparin injection (subcutaneous)  5,000 Units Subcutaneous Q8H  . insulin aspart  0-5 Units Subcutaneous QHS  . insulin aspart  0-9 Units Subcutaneous TID WC  . lamoTRIgine  50 mg Oral QHS  . metoprolol succinate  50 mg Oral Daily  . multivitamin with minerals  1 tablet Oral Daily  . Ensure Max Protein  11 oz Oral BID BM  . sertraline  50 mg Oral Daily   Continuous Infusions: . sodium chloride 100 mL/hr at 11/17/18 1220  . ceFEPime (MAXIPIME) IV Stopped (11/16/18 1743)  . vancomycin 1,000 mg (11/17/18 0557)    Assessment/Plan:  1. Severe sepsis secondary to osteomyelitis of the sacrum and sacral decubitus stage IV.  Continue IV antibiotics here.  Spoke with patient's daughter and palliative care and will convert over to home with hospice.  Patient will require lifelong suppressive antibiotics.  No further IV antibiotics upon discharge home.  Will leave Foley catheter in.  We will see if hospice can do the wound VAC.  If not the patient will need wet-to-dry dressings. 2. Acute kidney injury.  No further lab work. 3. Type 2 diabetes mellitus.  Hold metformin.  Patient here on sliding scale insulin coverage. 4. History of hypertension.  Holding medications at this time 5. Severe protein calorie malnutrition  Code Status:     Code Status Orders  (From admission, onward)         Start     Ordered   11/14/18 0904  Do not attempt resuscitation (DNR)  Continuous    Question Answer Comment  In the event of cardiac or respiratory ARREST Do not call a "code blue"   In the event of cardiac or respiratory ARREST Do not perform Intubation, CPR, defibrillation or ACLS   In the event of cardiac or  respiratory ARREST Use medication by any route, position, wound care, and other measures to relive pain and suffering. May use oxygen, suction and manual treatment of airway obstruction as needed for comfort.   Comments d/w Kriste Basque  and pt, they wish to be DNR/ DNI      11/14/18 0903        Code Status History    Date Active Date Inactive Code Status Order ID Comments User Context   11/13/2018 1532 11/14/2018 0903 Full Code [id number]  Otila Back, MD ED   10/22/2018 2127 10/26/2018 1945 Full Code [id number]  Fritzi Mandes, MD Inpatient   10/13/2018 0457 10/20/2018 1730 Full Code [id number]  Harrie Foreman, MD Inpatient     Family Communication: Spoke with daughter on the phone Disposition Plan: To be determined  Consultants:  Palliative care  General surgery  Antibiotics:  IV antibiotics while here but will be discharged on oral suppressive antibiotics  Time spent: 28 minutes  [place]

## 2018-11-17 NOTE — Telephone Encounter (Signed)
Copied from [place] [id number]. Topic: Quick Communication - Home Health Verbal Orders >> Nov 17, 2018  4:14 PM Margot Ables wrote: Caller/Agency: Drue Dun w/[facility] Callback Number: [phone], direct line - OK to call main # for verbal order [phone] ask to speak to triage Requesting OT/PT/Skilled Nursing/Social Work/Speech Therapy: Hospice Frequency: Pt is scheduled to dc from [facility] hospital tomorrow and wanting hospice care. Requesting order from Dr. Derrel Nip and asking is she will be attending of record. Please advise.

## 2018-11-17 NOTE — Progress Notes (Signed)
New referral for [facility] hospice services at home received from [place]. Patient is an 85 year old woman admitted to [facility] on 5/22 from [facility] with a diagnosis of sepsis, thought to be from her stage IV sacral wound. She has been treated with IV antibiotics and despite this has continued to decline, with poor oral intake. Palliative Medicine was consulted and have met with patient and her family. Plan is for discharge home with the support of hospice services. Patient seen lying in bed, appeared to be sleeping, no nonverbal s/s of pain or discomfort noted. Writer spoke on the phone with patient's daughter [name] to initiated education regarding hospice services, philosophy and team approach to care with understanding voiced. DME needs discussed, patient will need a hospital bed, this has been ordered for delivery tomorrow per Lisa's request. Address, contact phone numbers and PCP verified. Patient information faxed to referral. Patient will need EMS transport with signed DNR in place. Hospital team updated. Will continue to follow through discharge. Thank you. Flo Shanks BSN, RN, [facility] [phone]

## 2018-11-17 NOTE — Consult Note (Signed)
[place] nurse consulted for sacral wound.   Reviewed chart. Dr. Dahlia Byes has seen this patient in consultation and has given orders for enzymatic debridement and BID dressing changes. I have updated the orders to reflect this and added an air mattress for pressure redistribution, moisture management and comfort.  May be a palliative patient soon.    This wound care can be performed by the bedside clinical staff and not further aggressive care desired. Will not consult for this reason  Re consult if needed, will not follow at this time. Thanks  Merion Grimaldo [employer], RN,CWOCN, CNS, [place] [phone])

## 2018-11-17 NOTE — Progress Notes (Addendum)
Daily Progress Note   Patient Name: Monica [name]       Date: 11/17/2018 DOB: 08-29-1929  Age: 85 y.o. MRN#: [id number] Attending Physician: Loletha Grayer, MD Primary Care Physician: [name], MD Admit Date: 11/13/2018  Reason for Consultation/Follow-up: Establishing goals of care  Subjective: Patient is resting in bed. She has not eaten breakfast. She states she is not hungry. Discussed that without proper nutrition, her wound cannot heal. Discussed without eating and drinking, she cannot sustain. Discussed aggressive care vs hospice at home as offered by daughter yesterday. She states "I want to stay until I'm well." Concern that she does not understand that without eating she cannot sustain herself, nor heal her wound. She can tell me 2 of her children's names today, Dominica Severin and Lattie Haw. Cannot tell me Renee's name. Lattie Haw to come in for meeting with the patient to discuss care moving forward.   1:30: Met with Lattie Haw and her mother at bedside.  We discussed her diagnosis, prognosis, GOC, EOL wishes disposition and options. We discussed her poor oral intake, albumin, creatinine, wound, and mobility.  A detailed discussion was had today regarding advanced directives.  Concepts specific to code status, artifical feeding and hydration, IV antibiotics and rehospitalization were discussed.  The difference between an aggressive medical intervention path and a comfort care path was discussed.  Values and goals of care important to patient and family were attempted to be elicited.  Discussed limitations of medical interventions to prolong quality of life in some situations and discussed the concept of human mortality.  Concept of Hospice and Palliative Care were discussed.   Lattie Haw states she wants her  mother to come home and be with the family. Renee on speaker phone states the same. Patient states she is tired of being stuck and of procedures that hurt. She  is worried about her families' ability to provide care for her as she feels she would be a burden to her family. Renee and Lattie Haw assure her they would like for her to come home.  They would like to focus on comfort.   Lattie Haw and Joseph Art would like for their mother to be discharged tomorrow as the family needs to move furniture and ready the home for her. They state they will need a hospital bed.   The patient and family  would like to continue abx and IVF until D/C so she is in the best place possible for the family when she gets home. They do not want any further lab work. They understand her very poor prognosis. They would like oral antibiotics at D/C to attempt to prolong her quality time if able, and see if she will eat and drink more since she is home.   I completed a MOST form today with patient. Daughter was present, and the signed original was placed in the chart. A photocopy was placed in the chart to be scanned into EMR. The patient outlined their wishes for the following treatment decisions:  Cardiopulmonary Resuscitation: Do Not Attempt Resuscitation (DNR/No CPR)  Medical Interventions: Comfort Measures: Keep clean, warm, and dry. Use medication by any route, positioning, wound care, and other measures to relieve pain and suffering. Use oxygen, suction and manual treatment of airway obstruction as needed for comfort. Do not transfer to the hospital unless comfort needs cannot be met in current location.  Antibiotics: Antibiotics if indicated . Oral antibiotics only.   IV Fluids: No IV fluids (provide other measures to ensure comfort)  Feeding Tube: No feeding tube    Length of Stay: 4  Current Medications: Scheduled Meds:  . acidophilus  1 capsule Oral Daily  . collagenase   Topical BID  . docusate sodium  100 mg Oral BID  .  heparin injection (subcutaneous)  5,000 Units Subcutaneous Q8H  . insulin aspart  0-5 Units Subcutaneous QHS  . insulin aspart  0-9 Units Subcutaneous TID WC  . lamoTRIgine  50 mg Oral QHS  . metoprolol succinate  50 mg Oral Daily  . multivitamin with minerals  1 tablet Oral Daily  . Ensure Max Protein  11 oz Oral BID BM  . sertraline  50 mg Oral Daily    Continuous Infusions: . sodium chloride Stopped (11/17/18 0557)  . ceFEPime (MAXIPIME) IV Stopped (11/16/18 1743)  . vancomycin 1,000 mg (11/17/18 0557)    PRN Meds: acetaminophen, ondansetron (ZOFRAN) IV, oxyCODONE  Physical Exam Pulmonary:     Effort: Pulmonary effort is normal.  Neurological:     Mental Status: She is alert.             Vital Signs: BP 117/79 (BP Location: Left Arm)   Pulse 63   Temp (!) 97.5 F (36.4 C) (Oral)   Resp 18   Ht '5\' 7"'  (1.702 m)   Wt 70.5 kg   SpO2 100%   BMI 24.34 kg/m  SpO2: SpO2: 100 % O2 Device: O2 Device: Room Air O2 Flow Rate:    Intake/output summary:   Intake/Output Summary (Last 24 hours) at 11/17/2018 0951 Last data filed at 11/17/2018 0557 Gross per 24 hour  Intake 2345.86 ml  Output 1650 ml  Net 695.86 ml   LBM: Last BM Date: 11/16/18 Baseline Weight: Weight: 70.5 kg Most recent weight: Weight: 70.5 kg       Palliative Assessment/Data: 20%      Patient Active Problem List   Diagnosis Date Noted  . Sepsis ([place]) 10/22/2018  . Stage IV pressure ulcer of sacral region ([place])   . Weakness 10/13/2018  . Pressure injury of skin 10/13/2018  . Spinal stenosis of lumbar region 10/06/2018  . Fecal incontinence 10/06/2018  . Type 2 diabetes mellitus with diabetic nephropathy, with long-term current use of insulin ([place]) 09/27/2018  . CKD stage 3 due to type 2 diabetes mellitus ([place]) 09/27/2018  . Delirium due to another  medical condition, acute, hyperactive 09/27/2018  . Impaired ambulation 09/27/2018  . Recurrent falls 06/11/2017  . Dystrophic nail 11/13/2016  .  Allergic rhinitis 09/30/2015  . Major depressive disorder, recurrent episode with anxious distress ([place]) 10/10/2013  . Vitamin D deficiency 10/08/2013  . Bilateral chronic knee pain 09/21/2011  . Tobacco abuse counseling 07/08/2011  . Incontinence of urine 06/13/2011  . History of tobacco abuse 06/11/2011  . Hyperlipidemia   . Hypertension   . COPD (chronic obstructive pulmonary disease) ([place])   . B12 deficiency   . Lung mass     Palliative Care Assessment & Plan    Recommendations/Plan:  Plans for home with hospice.   Continue current care until D/C except lab draws.    Code Status:    Code Status Orders  (From admission, onward)         Start     Ordered   11/14/18 0904  Do not attempt resuscitation (DNR)  Continuous    Question Answer Comment  In the event of cardiac or respiratory ARREST Do not call a "code blue"   In the event of cardiac or respiratory ARREST Do not perform Intubation, CPR, defibrillation or ACLS   In the event of cardiac or respiratory ARREST Use medication by any route, position, wound care, and other measures to relive pain and suffering. May use oxygen, suction and manual treatment of airway obstruction as needed for comfort.   Comments d/w Kriste Basque and pt, they wish to be DNR/ DNI      11/14/18 0903        Code Status History    Date Active Date Inactive Code Status Order ID Comments User Context   11/13/2018 1532 11/14/2018 0903 Full Code [id number]  Otila Back, MD ED   10/22/2018 2127 10/26/2018 1945 Full Code [id number]  Fritzi Mandes, MD Inpatient   10/13/2018 0457 10/20/2018 1730 Full Code [id number]  Harrie Foreman, MD Inpatient       Prognosis:  < 2 weeks. Very poor PO intake. Albumin 1.7, dehydration with creatinine increase. Infected sacral wound with bone exposed. Failure to thrive at rehab since last discharge. Previous diagnosis of osteomyelitis.   Discharge Planning:  Home with Hospice  Care plan was discussed with  RN  Thank you for allowing the Palliative Medicine Team to assist in the care of this patient.   Time In: 12:10 Time Out: 1:50 Total Time 100 min Prolonged Time Billed  yes       Greater than 50%  of this time was spent counseling and coordinating care related to the above assessment and plan.  Asencion Gowda, NP  Please contact Palliative Medicine Team phone at [phone] for questions and concerns.

## 2018-11-17 NOTE — TOC Progression Note (Addendum)
Transition of Care [facility]) - Progression Note    Patient Details  Name: Monica [name] MRN: [id number] Date of Birth: 1929/07/06  Transition of Care [facility] CM/SW Contact  Ross Ludwig, [place] Phone Number: 11/17/2018, 5:30 PM  Clinical Narrative:     CSW was informed by palliative team that patient's family would like home with hospice.  CSW spoke to patient's daughter [name], and provided choice of hospice agencies and she chose [facility].  CSW made referral to [facility] nurse liaison Santiago Glad.  Per Santiago Glad, she should be able to get equipment delivered tomorrow, and patient can bed discharged home if she is medically ready for discharge and orders have been received.     Expected Discharge Plan: Skilled Nursing Facility Barriers to Discharge: No Barriers Identified  Expected Discharge Plan and Services Expected Discharge Plan: [place]   Discharge Planning Services: CM Consult Post Acute Care Choice: [place] Living arrangements for the past 2 months: Single Family Home, [place]                 DME Arranged: N/A DME Agency: NA         HH Agency: NA         Social Determinants of Health (SDOH) Interventions    Readmission Risk Interventions Readmission Risk Prevention Plan 10/24/2018 10/20/2018  Transportation Screening Complete Complete  PCP or Specialist Appt within 3-5 Days Complete Not Complete  Not Complete comments Patient will be going to SNF Pt going to SNF  [place] or [place] Not Complete Not Complete  HRI or Home Care Consult comments Patient going to SNF Pt going to SNF  Social Work Consult for [place] Planning/Counseling Complete Complete  Palliative Care Screening Complete Complete  Medication Review Press photographer) Complete Complete  Some recent data might be hidden

## 2018-11-18 LAB — CULTURE, BLOOD (ROUTINE X 2)
Culture: NO GROWTH
Culture: NO GROWTH
Special Requests: ADEQUATE

## 2018-11-18 LAB — GLUCOSE, CAPILLARY
Glucose-Capillary: 88 mg/dL (ref 70–99)
Glucose-Capillary: 81 mg/dL (ref 70–99)
Glucose-Capillary: 87 mg/dL (ref 70–99)

## 2018-11-18 LAB — CREATININE, SERUM
Creatinine, Ser: 1.84 mg/dL — ABNORMAL HIGH (ref 0.44–1.00)
GFR calc Af Amer: 28 mL/min — ABNORMAL LOW (ref >60)
GFR calc non Af Amer: 24 mL/min — ABNORMAL LOW (ref >60)

## 2018-11-18 MED ORDER — LAMOTRIGINE 25 MG PO TABS: 50.0000 mg | ORAL_TABLET | Freq: Every day | ORAL | 0 refills | Status: DC

## 2018-11-18 MED ORDER — ENSURE MAX PROTEIN PO LIQD: 11.0000 [oz_av] | Freq: Two times a day (BID) | ORAL | 0 refills | Status: AC

## 2018-11-18 MED ORDER — AMOXICILLIN-POT CLAVULANATE 500-125 MG PO TABS
1.0000 | ORAL_TABLET | Freq: Two times a day (BID) | ORAL | Status: DC
  Administered 2018-11-18: 500 mg via ORAL
  Filled 2018-11-18 (×2): qty 1

## 2018-11-18 MED ORDER — DOCUSATE SODIUM 100 MG PO CAPS: 100.0000 mg | ORAL_CAPSULE | Freq: Three times a day (TID) | ORAL | 0 refills | Status: DC | PRN

## 2018-11-18 MED ORDER — SERTRALINE HCL 50 MG PO TABS: 50.0000 mg | ORAL_TABLET | Freq: Every day | ORAL | 0 refills | Status: AC

## 2018-11-18 MED ORDER — MORPHINE SULFATE (CONCENTRATE) 10 MG/0.5ML PO SOLN: 5.0000 mg | ORAL | Status: DC | PRN

## 2018-11-18 MED ORDER — DOCUSATE SODIUM 100 MG PO CAPS: 100.0000 mg | ORAL_CAPSULE | Freq: Three times a day (TID) | ORAL | 0 refills | Status: AC | PRN

## 2018-11-18 MED ORDER — DOXYCYCLINE HYCLATE 100 MG PO TABS
100.0000 mg | ORAL_TABLET | Freq: Two times a day (BID) | ORAL | Status: DC
  Administered 2018-11-18: 10:00:00 100 mg via ORAL
  Filled 2018-11-18: qty 1

## 2018-11-18 MED ORDER — ENSURE MAX PROTEIN PO LIQD: 11.0000 [oz_av] | Freq: Two times a day (BID) | ORAL | 0 refills | Status: DC

## 2018-11-18 MED ORDER — OCUVITE-LUTEIN PO CAPS: 1.0000 | ORAL_CAPSULE | Freq: Every day | ORAL | 0 refills | Status: AC

## 2018-11-18 MED ORDER — DOXYCYCLINE HYCLATE 100 MG PO TABS: 100.0000 mg | ORAL_TABLET | Freq: Two times a day (BID) | ORAL | 0 refills | Status: DC

## 2018-11-18 MED ORDER — MORPHINE SULFATE (CONCENTRATE) 10 MG/0.5ML PO SOLN: 5.0000 mg | ORAL | 0 refills | Status: AC | PRN

## 2018-11-18 MED ORDER — OCUVITE-LUTEIN PO CAPS: 1.0000 | ORAL_CAPSULE | Freq: Every day | ORAL | 0 refills | Status: DC

## 2018-11-18 MED ORDER — SERTRALINE HCL 50 MG PO TABS: 50.0000 mg | ORAL_TABLET | Freq: Every day | ORAL | 0 refills | Status: DC

## 2018-11-18 MED ORDER — COLLAGENASE 250 UNIT/GM EX OINT: TOPICAL_OINTMENT | Freq: Every day | CUTANEOUS | 0 refills | Status: AC

## 2018-11-18 MED ORDER — AMOXICILLIN-POT CLAVULANATE 500-125 MG PO TABS: 1.0000 | ORAL_TABLET | Freq: Two times a day (BID) | ORAL | 0 refills | Status: AC

## 2018-11-18 MED ORDER — METOPROLOL SUCCINATE ER 25 MG PO TB24: 25.0000 mg | ORAL_TABLET | Freq: Every day | ORAL | 0 refills | Status: DC

## 2018-11-18 MED ORDER — AMOXICILLIN-POT CLAVULANATE 500-125 MG PO TABS: 1.0000 | ORAL_TABLET | Freq: Two times a day (BID) | ORAL | 0 refills | Status: DC

## 2018-11-18 MED ORDER — MORPHINE SULFATE (CONCENTRATE) 10 MG/0.5ML PO SOLN: 5.0000 mg | ORAL | 0 refills | Status: DC | PRN

## 2018-11-18 MED ORDER — METOPROLOL SUCCINATE ER 25 MG PO TB24: 25.0000 mg | ORAL_TABLET | Freq: Every day | ORAL | 0 refills | Status: AC

## 2018-11-18 MED ORDER — PROBIOTIC DAILY PO CAPS: 1.0000 | ORAL_CAPSULE | Freq: Every day | ORAL | 0 refills | Status: DC

## 2018-11-18 MED ORDER — PROBIOTIC DAILY PO CAPS: 1.0000 | ORAL_CAPSULE | Freq: Every day | ORAL | 0 refills | Status: AC

## 2018-11-18 MED ORDER — COLLAGENASE 250 UNIT/GM EX OINT: TOPICAL_OINTMENT | Freq: Every day | CUTANEOUS | 0 refills | Status: DC

## 2018-11-18 NOTE — Progress Notes (Addendum)
Daily Progress Note   Patient Name: Monica [name]       Date: 11/18/2018 DOB: 07/02/1929  Age: 85 y.o. MRN#: [id number] Attending Physician: Loletha Grayer, MD Primary Care Physician: [name], MD Admit Date: 11/13/2018  Reason for Consultation/Follow-up: Psycho/social support  Subjective: Patient is resting in bed. She states she is discharging home today. She states she is ready to go home to be with family. She voices fear of pain with dying. Discussed hospice's role in symptom management. She voices fear of being alone now. We discussed that the family planned to place her bed in the living room to be with family. Discussed sharing her feelings with her family. All questions answered.     Length of Stay: 5  Current Medications: Scheduled Meds:  . acidophilus  1 capsule Oral Daily  . amoxicillin-clavulanate  1 tablet Oral BID  . collagenase   Topical BID  . docusate sodium  100 mg Oral BID  . doxycycline  100 mg Oral Q12H  . heparin injection (subcutaneous)  5,000 Units Subcutaneous Q8H  . insulin aspart  0-5 Units Subcutaneous QHS  . insulin aspart  0-9 Units Subcutaneous TID WC  . lamoTRIgine  50 mg Oral QHS  . metoprolol succinate  50 mg Oral Daily  . multivitamin with minerals  1 tablet Oral Daily  . Ensure Max Protein  11 oz Oral BID BM  . sertraline  50 mg Oral Daily    Continuous Infusions:   PRN Meds: acetaminophen, morphine CONCENTRATE, ondansetron (ZOFRAN) IV, oxyCODONE  Physical Exam Pulmonary:     Effort: Pulmonary effort is normal.  Neurological:     Mental Status: She is alert.             Vital Signs: BP (!) 135/50 (BP Location: Left Arm)   Pulse (!) 57   Temp (!) 97.5 F (36.4 C) (Oral)   Resp 16   Ht 5\' 7"  (1.702 m)   Wt 70.5 kg   SpO2  99%   BMI 24.34 kg/m  SpO2: SpO2: 99 % O2 Device: O2 Device: Room Air O2 Flow Rate:    Intake/output summary:   Intake/Output Summary (Last 24 hours) at 11/18/2018 1403 Last data filed at 11/18/2018 0550 Gross per 24 hour  Intake 1398.33 ml  Output 2275 ml  Net -  876.67 ml   LBM: Last BM Date: 11/16/18 Baseline Weight: Weight: 70.5 kg Most recent weight: Weight: 70.5 kg       Palliative Assessment/Data: 20%    Flowsheet Rows     Most Recent Value  Intake Tab  Referral Department  Hospitalist  Unit at Time of Referral  Med/Surg Unit  Palliative Care Primary Diagnosis  Sepsis/Infectious Disease  Date Notified  11/15/18  Palliative Care Type  Return patient Palliative Care  Reason for referral  Clarify Goals of Care  Date of Admission  11/13/18  Date first seen by Palliative Care  11/16/18  # of days Palliative referral response time  1 Day(s)  # of days IP prior to Palliative referral  2  Clinical Assessment  Psychosocial & Spiritual Assessment  Palliative Care Outcomes      Patient Active Problem List   Diagnosis Date Noted  . Sepsis ([place]) 10/22/2018  . Stage IV pressure ulcer of sacral region ([place])   . Weakness 10/13/2018  . Pressure injury of skin 10/13/2018  . Spinal stenosis of lumbar region 10/06/2018  . Fecal incontinence 10/06/2018  . Type 2 diabetes mellitus with diabetic nephropathy, with long-term current use of insulin ([place]) 09/27/2018  . CKD stage 3 due to type 2 diabetes mellitus ([place]) 09/27/2018  . Delirium due to another medical condition, acute, hyperactive 09/27/2018  . Impaired ambulation 09/27/2018  . Recurrent falls 06/11/2017  . Dystrophic nail 11/13/2016  . Allergic rhinitis 09/30/2015  . Major depressive disorder, recurrent episode with anxious distress ([place]) 10/10/2013  . Vitamin D deficiency 10/08/2013  . Bilateral chronic knee pain 09/21/2011  . Tobacco abuse counseling 07/08/2011  . Incontinence of urine 06/13/2011  . History of  tobacco abuse 06/11/2011  . Hyperlipidemia   . Hypertension   . COPD (chronic obstructive pulmonary disease) ([place])   . B12 deficiency   . Lung mass     Palliative Care Assessment & Plan    Recommendations/Plan:  Plans for home with hospice.   Continue current care until D/C except lab draws.    Code Status:    Code Status Orders  (From admission, onward)         Start     Ordered   11/14/18 0904  Do not attempt resuscitation (DNR)  Continuous    Question Answer Comment  In the event of cardiac or respiratory ARREST Do not call a "code blue"   In the event of cardiac or respiratory ARREST Do not perform Intubation, CPR, defibrillation or ACLS   In the event of cardiac or respiratory ARREST Use medication by any route, position, wound care, and other measures to relive pain and suffering. May use oxygen, suction and manual treatment of airway obstruction as needed for comfort.   Comments d/w Kriste Basque and pt, they wish to be DNR/ DNI      11/14/18 0903        Code Status History    Date Active Date Inactive Code Status Order ID Comments User Context   11/13/2018 1532 11/14/2018 0903 Full Code [id number]  Otila Back, MD ED   10/22/2018 2127 10/26/2018 1945 Full Code [id number]  Fritzi Mandes, MD Inpatient   10/13/2018 0457 10/20/2018 1730 Full Code [id number]  Harrie Foreman, MD Inpatient       Prognosis:  < 2 weeks. Very poor PO intake. Albumin 1.7, dehydration with creatinine increase. Infected sacral wound with bone exposed. Failure to thrive at rehab since last discharge. Previous diagnosis of osteomyelitis.  Discharge Planning:  Home with Hospice  Care plan was discussed with RN  Thank you for allowing the Palliative Medicine Team to assist in the care of this patient.   Total Time 35 min Prolonged Time Billed  no       Greater than 50%  of this time was spent counseling and coordinating care related to the above assessment and plan.  Asencion Gowda, NP  Please contact Palliative Medicine Team phone at [phone] for questions and concerns.

## 2018-11-18 NOTE — Clinical Social Work Note (Deleted)
CSW spoke with hospice nurse liasion, and patient's bed will be available today and can discharge once bed is delivered.

## 2018-11-18 NOTE — TOC Transition Note (Addendum)
Transition of Care [facility]) - CM/SW Discharge Note   Patient Details  Name: Monica [name] MRN: [id number] Date of Birth: [birth year]  [facility]) CM/SW Contact:  Ross Ludwig, LCSW Phone Number: 11/18/2018, 3:52 PM   Clinical Narrative:     Patient's family reports they have received the hospital bed.  Patient will be discharging today back home with hospice services.  Patient and family agreeable to plans will transport via ems.  Bedside nurse spoke to patient's daughter Lattie Haw.      Final next level of care: Home w Hospice Care Barriers to Discharge: Barriers Resolved   Patient Goals and CMS Choice Patient states their goals for this hospitalization and ongoing recovery are:: (Patient is Unable to state at time of assessment) CMS Medicare.gov Compare Post Acute Care list provided to:: (Nothing provided at time of assessment) Choice offered to / list presented to : NA  Discharge Placement                       Discharge Plan and Services   Discharge Planning Services: CM Consult Post Acute Care Choice: Home with hospice          DME Arranged: Hospital bed DME Agency: ([place]) Date DME Agency Contacted: 11/18/18 Time DME Agency Contacted: 1000 Representative spoke with at DME Agency: [place] from [place]: [facility] Date [place]: 11/17/18 Time [place]: 58 Representative spoke with at [place]: [place] (SDOH) Interventions     Readmission Risk Interventions Readmission Risk Prevention Plan 10/24/2018 10/20/2018  Transportation Screening Complete Complete  PCP or Specialist Appt within 3-5 Days Complete Not Complete  Not Complete comments Patient will be going to SNF Pt going to SNF  [place] or [place] Not Complete Not Complete  HRI or Home Care Consult comments Patient going to SNF Pt going to SNF  Social Work Consult  for [place] Planning/Counseling Complete Complete  Palliative Care Screening Complete Complete  Medication Review Press photographer) Complete Complete  Some recent data might be hidden

## 2018-11-18 NOTE — TOC Progression Note (Addendum)
Transition of Care [facility]) - Progression Note    Patient Details  Name: CHARLYN VIALPANDO MRN: [id number] Date of Birth: 02-08-1930  Transition [facility]) CM/SW Contact  Ross Ludwig, [place] Phone Number: 11/18/2018, 11:31 AM  Clinical Narrative:     CSW spoke with hospice nurse liasion, and patient's bed will be available today and can discharge once bed is delivered.  Patient will be going home with home hospice.   Expected Discharge Plan: Skilled Nursing Facility Barriers to Discharge: No Barriers Identified  Expected Discharge Plan and Services Expected Discharge Plan: [place]   Discharge Planning Services: CM Consult Post Acute Care Choice: [place] Living arrangements for the past 2 months: [place], [place] Expected Discharge Date: 11/18/18               DME Arranged: N/A DME Agency: NA         HH Agency: NA         Social Determinants of Health (SDOH) Interventions    Readmission Risk Interventions Readmission Risk Prevention Plan 10/24/2018 10/20/2018  Transportation Screening Complete Complete  PCP or Specialist Appt within 3-5 Days Complete Not Complete  Not Complete comments Patient will be going to SNF Pt going to SNF  [place] or [place] Not Complete Not Complete  HRI or Home Care Consult comments Patient going to SNF Pt going to SNF  Social Work Consult for [place] Planning/Counseling Complete Complete  Palliative Care Screening Complete Complete  Medication Review Press photographer) Complete Complete  Some recent data might be hidden

## 2018-11-18 NOTE — Telephone Encounter (Signed)
Yes ok to give verbal orders for hospice care

## 2018-11-18 NOTE — Discharge Instructions (Addendum)
Change foley every four weeks Clean sacral wound daily with soap and water,  Apply santyl to necrotic material, moisten kerlix with sterile water and apply in wound, cover with ABD pads and secure with paper tape  Will give augmentin for two weeks and lifelong doxycycline (suppressive antibiotics)

## 2018-11-18 NOTE — Discharge Summary (Signed)
Monica [name] at [place] NAME: Monica [name]    MR#:  [id number]  DATE OF BIRTH:  Mar 02, 1930  DATE OF ADMISSION:  11/13/2018 ADMITTING PHYSICIAN: Otila Back, MD  DATE OF DISCHARGE: 11/18/2018  PRIMARY CARE PHYSICIAN: [name], MD    ADMISSION DIAGNOSIS:  Acute kidney injury ([place]) [N17.9] Septic shock ([place]) [A41.9, R65.21]  DISCHARGE DIAGNOSIS:  Active Problems:   Sepsis ([place])   SECONDARY DIAGNOSIS:   Past Medical History:  Diagnosis Date  . B12 deficiency   . COPD (chronic obstructive pulmonary disease) ([place])   . Diabetes mellitus   . H/O: Bell's palsy   . Hyperlipidemia   . Hypertension   . Lung mass April 2011   s/p biopsy, no cancer cells found, Repeat CT Dec 2012 unchanged  . Osteomyelitis ([place])     HOSPITAL COURSE:   1.  Severe sepsis secondary to osteomyelitis of the sacrum and sacral decubitus stage IV.  We gave IV antibiotics here.  In speaking with the patient's daughter and palliative care the patient will be converted over to hospice at home.  I will give 2 weeks of oral Augmentin.  Would probably continue lifelong suppressive antibiotics with doxycycline.  Sacral decubitus will be cleaned with soap and water Santyl lotion to necrotic areas, moistened Kerlix put into decubitus ulcer and covered with ABD pads and paper tape. 2.  Acute kidney injury.  Patient was given IV fluids during the hospital course.  No further lab work-up.  Creatinine upon discharge 1.84. 3.  Type 2 diabetes.  Continue to hold metformin.  Sugars have been good without medications. 4.  History of hypertension.  Decrease dose of Toprol to 25 mg daily 5.  Severe protein calorie malnutrition. 6.  Depression on Zoloft 7.  Foley catheter will be left in and stay in to keep sacral decubitus dry.  Change Foley catheter every 4 weeks.  Last changed on 11/14/2018.  DISCHARGE CONDITIONS:   Guarded  CONSULTS OBTAINED:  General surgery Palliative  care Hospice  DRUG ALLERGIES:  No Known Allergies  DISCHARGE MEDICATIONS:   Allergies as of 11/18/2018   No Known Allergies     Medication List    STOP taking these medications   metFORMIN 500 MG tablet Commonly known as:  GLUCOPHAGE   oxyCODONE 5 MG immediate release tablet Commonly known as:  Oxy IR/ROXICODONE     TAKE these medications   Accu-Chek Guide test strip Generic drug:  glucose blood USE AS DIRECTED TWICE A DAY   acetaminophen 325 MG tablet Commonly known as:  TYLENOL Take 2 tablets (650 mg total) by mouth every 6 (six) hours as needed for mild pain (or Fever >/= 101).   amoxicillin-clavulanate 500-125 MG tablet Commonly known as:  AUGMENTIN Take 1 tablet (500 mg total) by mouth 2 (two) times daily.   collagenase ointment Commonly known as:  SANTYL Apply topically daily.   docusate sodium 100 MG capsule Commonly known as:  COLACE Take 1 capsule (100 mg total) by mouth every 8 (eight) hours as needed for mild constipation.   doxycycline 100 MG tablet Commonly known as:  VIBRA-TABS Take 1 tablet (100 mg total) by mouth every 12 (twelve) hours.   Ensure Max Protein Liqd Take 330 mLs (11 oz total) by mouth 2 (two) times daily between meals.   lamoTRIgine 25 MG tablet Commonly known as:  LAMICTAL Take 2 tablets (50 mg total) by mouth at bedtime.   metoprolol succinate 25 MG  24 hr tablet Commonly known as:  TOPROL-XL Take 1 tablet (25 mg total) by mouth daily. Take with or immediately following a meal. What changed:    medication strength  how much to take   morphine CONCENTRATE 10 MG/0.5ML Soln concentrated solution Take 0.25 mLs (5 mg total) by mouth every 2 (two) hours as needed for moderate pain, severe pain or shortness of breath.   multivitamin-lutein Caps capsule Take 1 capsule by mouth daily.   Probiotic Daily Caps Take 1 capsule by mouth daily.   sertraline 50 MG tablet Commonly known as:  ZOLOFT Take 1 tablet (50 mg total) by  mouth daily.            Durable Medical Equipment  (From admission, onward)         Start     Ordered   11/17/18 1412  For home use only DME Hospital bed  Once    Question Answer Comment  Length of Need Lifetime   Patient has (list medical condition): stage 4 sacral decubiti   The above medical condition requires: Patient requires the ability to reposition frequently   Head must be elevated greater than: 30 degrees   Bed type Semi-electric   Support Surface: Alternating Pressure Pad and Pump      11/17/18 1412           DISCHARGE INSTRUCTIONS:   Follow-up with home hospice 1 day  If you experience worsening of your admission symptoms, develop shortness of breath, life threatening emergency, suicidal or homicidal thoughts you must seek medical attention immediately by calling 911 or calling your MD immediately  if symptoms less severe.  You Must read complete instructions/literature along with all the possible adverse reactions/side effects for all the Medicines you take and that have been prescribed to you. Take any new Medicines after you have completely understood and accept all the possible adverse reactions/side effects.   Please note  You were cared for by a hospitalist during your hospital stay. If you have any questions about your discharge medications or the care you received while you were in the hospital after you are discharged, you can call the unit and asked to speak with the hospitalist on call if the hospitalist that took care of you is not available. Once you are discharged, your primary care physician will handle any further medical issues. Please note that NO REFILLS for any discharge medications will be authorized once you are discharged, as it is imperative that you return to your primary care physician (or establish a relationship with a primary care physician if you do not have one) for your aftercare needs so that they can reassess your need for  medications and monitor your lab values.    Today   CHIEF COMPLAINT:   Chief Complaint  Patient presents with  . Abnormal Lab    HISTORY OF PRESENT ILLNESS:  Monica [name]  is a 85 y.o. female sent in with abnormal lab   VITAL SIGNS:  Blood pressure (!) 135/50, pulse (!) 57, temperature (!) 97.5 F (36.4 C), temperature source Oral, resp. rate 16, height 5\' 7"  (1.702 m), weight 70.5 kg, SpO2 99 %.    PHYSICAL EXAMINATION:  GENERAL:  85 y.o.-year-old patient lying in the bed with no acute distress.  EYES: Pupils equal, round, reactive to light and accommodation. No scleral icterus. Extraocular muscles intact.  HEENT: Head atraumatic, normocephalic. Oropharynx and nasopharynx clear.  NECK:  Supple, no jugular venous distention. No thyroid enlargement, no  tenderness.  LUNGS: Normal breath sounds bilaterally, no wheezing, rales,rhonchi or crepitation. No use of accessory muscles of respiration.  CARDIOVASCULAR: S1, S2 normal. No murmurs, rubs, or gallops.  ABDOMEN: Soft, non-tender, non-distended. Bowel sounds present. No organomegaly or mass.  EXTREMITIES: 3+ right pedal edema, 2+ left pedal edema.  No cyanosis, or clubbing.  NEUROLOGIC: Cranial nerves II through XII are intact. PSYCHIATRIC: The patient is alert and answers only yes or no questions.  SKIN: Stage IV large decubitus ulcer  DATA REVIEW:   CBC Recent Labs  Lab 11/17/18 0306  WBC 12.1*  HGB 8.4*  HCT 27.6*  PLT 224    Chemistries  Recent Labs  Lab 11/14/18 0438  11/17/18 0306 11/18/18 0521  NA 144   < > 139  --   K 4.1   < > 3.4*  --   CL 117*   < > 114*  --   CO2 14*   < > 18*  --   GLUCOSE 111*   < > 106*  --   BUN 84*   < > 75*  --   CREATININE 2.56*   < > 2.21* 1.84*  CALCIUM 7.6*   < > 7.6*  --   MG 1.8  --   --   --   AST 22  --   --   --   ALT 32  --   --   --   ALKPHOS 122  --   --   --   BILITOT 0.7  --   --   --    < > = values in this interval not displayed.     Microbiology  Results  Results for orders placed or performed during the hospital encounter of 11/13/18  Blood Culture (routine x 2)     Status: None   Collection Time: 11/13/18 12:41 PM  Result Value Ref Range Status   Specimen Description BLOOD R A  Final   Special Requests   Final    BOTTLES DRAWN AEROBIC AND ANAEROBIC Blood Culture results may not be optimal due to an excessive volume of blood received in culture bottles   Culture   Final    NO GROWTH 5 DAYS Performed at [facility], [address] [place], [place] [address]    Report Status 11/18/2018 FINAL  Final  Blood Culture (routine x 2)     Status: None   Collection Time: 11/13/18 12:42 PM  Result Value Ref Range Status   Specimen Description BLOOD LH  Final   Special Requests   Final    BOTTLES DRAWN AEROBIC AND ANAEROBIC Blood Culture adequate volume   Culture   Final    NO GROWTH 5 DAYS Performed at [facility], [address] [place], [place] [address]    Report Status 11/18/2018 FINAL  Final  SARS Coronavirus 2 (CEPHEID - Performed in [place] hospital lab), Hosp Order     Status: None   Collection Time: 11/13/18 12:42 PM  Result Value Ref Range Status   SARS Coronavirus 2 NEGATIVE NEGATIVE Final    Comment: (NOTE) If result is NEGATIVE SARS-CoV-2 target nucleic acids are NOT DETECTED. The SARS-CoV-2 RNA is generally detectable in upper and lower  respiratory specimens during the acute phase of infection. The lowest  concentration of SARS-CoV-2 viral copies this assay can detect is 250  copies / mL. A negative result does not preclude SARS-CoV-2 infection  and should not be used as the sole basis  for treatment or other  patient management decisions.  A negative result may occur with  improper specimen collection / handling, submission of specimen other  than nasopharyngeal swab, presence of viral mutation(s) within the  areas targeted by this assay, and inadequate number of viral copies  (<250  copies / mL). A negative result must be combined with clinical  observations, patient history, and epidemiological information. If result is POSITIVE SARS-CoV-2 target nucleic acids are DETECTED. The SARS-CoV-2 RNA is generally detectable in upper and lower  respiratory specimens dur ing the acute phase of infection.  Positive  results are indicative of active infection with SARS-CoV-2.  Clinical  correlation with patient history and other diagnostic information is  necessary to determine patient infection status.  Positive results do  not rule out bacterial infection or co-infection with other viruses. If result is PRESUMPTIVE POSTIVE SARS-CoV-2 nucleic acids MAY BE PRESENT.   A presumptive positive result was obtained on the submitted specimen  and confirmed on repeat testing.  While 2019 novel coronavirus  (SARS-CoV-2) nucleic acids may be present in the submitted sample  additional confirmatory testing may be necessary for epidemiological  and / or clinical management purposes  to differentiate between  SARS-CoV-2 and other Sarbecovirus currently known to infect humans.  If clinically indicated additional testing with an alternate test  methodology [phone]) is advised. The SARS-CoV-2 RNA is generally  detectable in upper and lower respiratory sp ecimens during the acute  phase of infection. The expected result is Negative. Fact Sheet for Patients:  StrictlyIdeas.no Fact Sheet for Healthcare Providers: BankingDealers.co.za This test is not yet approved or cleared by the [place] FDA and has been authorized for detection and/or diagnosis of SARS-CoV-2 by FDA under an Emergency Use Authorization (EUA).  This EUA will remain in effect (meaning this test can be used) for the duration of the COVID-19 declaration under Section 564(b)(1) of the Act, 21 U.S.C. section 360bbb-3(b)(1), unless the authorization is terminated or revoked  sooner. Performed at [facility], [address] [place], [place] [address]   Urine culture     Status: Abnormal   Collection Time: 11/13/18 12:42 PM  Result Value Ref Range Status   Specimen Description   Final    URINE, RANDOM Performed at [facility], [address] [place], [place] [address]    Special Requests   Final    NONE Performed at [facility], [place], [place] [address]    Culture 80,000 COLONIES/mL YEAST (A)  Final   Report Status 11/15/2018 FINAL  Final  Surgical PCR screen     Status: None   Collection Time: 11/14/18  6:49 AM  Result Value Ref Range Status   MRSA, PCR NEGATIVE NEGATIVE Final   Staphylococcus aureus NEGATIVE NEGATIVE Final    Comment: (NOTE) The Xpert SA Assay (FDA approved for NASAL specimens in patients 52 years of age and older), is one component of a comprehensive surveillance program. It is not intended to diagnose infection nor to guide or monitor treatment. Performed at [facility], [address], [place], [place] [address]        Management plans discussed with the patient, family and they are in agreement.  CODE STATUS:     Code Status Orders  (From admission, onward)         Start     Ordered   11/14/18 0904  Do not attempt resuscitation (DNR)  Continuous    Question Answer Comment  In the event of cardiac or respiratory ARREST Do not call a "code blue"   In the event of cardiac or respiratory ARREST Do not perform Intubation, CPR, defibrillation or ACLS   In the event of cardiac or respiratory ARREST Use medication by any route, position, wound care, and other measures to relive pain and suffering. May use oxygen, suction and manual treatment of airway obstruction as needed for comfort.   Comments d/w Kriste Basque and pt, they wish to be DNR/ DNI      11/14/18 0903        Code Status History    Date Active Date Inactive Code Status Order ID Comments  User Context   11/13/2018 1532 11/14/2018 0903 Full Code [id number]  Otila Back, MD ED   10/22/2018 2127 10/26/2018 1945 Full Code [id number]  Fritzi Mandes, MD Inpatient   10/13/2018 0457 10/20/2018 1730 Full Code [id number]  Harrie Foreman, MD Inpatient      TOTAL TIME TAKING CARE OF THIS PATIENT: 35 minutes.    Loletha Grayer M.D on 11/18/2018 at 2:23 PM  Between 7am to 6pm - Pager - [phone]  After 6pm go to [email or web address] - password [email or web address]  [employer] Office  [phone]  CC: Primary care physician; [name], MD

## 2018-11-18 NOTE — Telephone Encounter (Signed)
Is it okay to give verbal orders for Hospice Care? Do you still want to be the attending physician?

## 2018-11-18 NOTE — Telephone Encounter (Signed)
Verbal orders given  

## 2018-11-18 NOTE — Progress Notes (Signed)
Follow up visit made to new referral for [facility] at home. Patient seen lying in bed, alert, able to converse and answer questions.Lunch tray at bedside, patient did not eat, she did take sips of ensure, but declined more during visit. Plan continues for discharge home with the support of hospice services, bed to be delivered between 12-2, hospital care team updated. Signed DNR in place in d/c packet. Will fax d/c summary when posted. Thank you Flo Shanks BSN, RN, [facility] [phone]

## 2018-11-19 ENCOUNTER — Telehealth: Payer: Self-pay | Admitting: Internal Medicine

## 2018-11-19 NOTE — Telephone Encounter (Signed)
Copied from [place] [phone]. Topic: Quick Communication - See Telephone Encounter >> Nov 19, 2018 11:32 AM Sheran Luz wrote: CRM for notification. See Telephone encounter for: 11/19/18.  Opal Sidles, with [facility], requesting an order be sent for medication for nausea. She is requesting a call back from CMA to discuss further.

## 2018-11-19 NOTE — Telephone Encounter (Signed)
Opal Sidles calling back to check status   [phone]

## 2018-11-20 MED ORDER — ONDANSETRON 4 MG PO TBDP: 4.0000 mg | ORAL_TABLET | Freq: Three times a day (TID) | ORAL | 0 refills | Status: DC | PRN

## 2018-11-20 MED ORDER — ONDANSETRON HCL 4 MG PO TABS: 4.0000 mg | ORAL_TABLET | Freq: Three times a day (TID) | ORAL | 5 refills | Status: AC | PRN

## 2018-11-20 NOTE — Addendum Note (Signed)
Addended by: [name] on: 11/20/2018 12:18 PM   Modules accepted: Orders

## 2018-11-20 NOTE — Telephone Encounter (Signed)
ZOFRAN PRINTED.

## 2018-11-20 NOTE — Telephone Encounter (Signed)
Spoke with Opal Sidles from Hospice to find out where to send the zofran prescription. Prescription has [facility] [place] per [place] request.

## 2018-11-20 NOTE — Telephone Encounter (Signed)
[facility]

## 2018-11-20 NOTE — Telephone Encounter (Signed)
Monica [name] states hospice does not cover the disintegrating tabs they will only cover the regular tablets can Dr Derrel Nip please call in regular tabs to:   [id number] [place] [place] - [address] [phone] (Phone) [phone] (Fax)

## 2018-11-20 NOTE — Telephone Encounter (Signed)
Hospice is requesting a rx for something for nausea.

## 2018-12-10 ENCOUNTER — Telehealth: Payer: Self-pay

## 2018-12-10 NOTE — Telephone Encounter (Signed)
Copied from [place] [phone]. Topic: General - Other >> Dec 09, 2018 12:47 PM Mcneil, Ja-Kwan wrote: Reason for CRM: Casimer Bilis with [facility] stated pt family advised her that Dr. Derrel Nip wants pt to remain on the antibiotics: amoxicillin-clavulanate (AUGMENTIN) 500-125 MG tablet and doxycycline (VIBRA-TABS) 100 MG tablet but there are no refills. Casimer Bilis would like to know if the antibiotics will be refilled. If approved please send Rx to [facility]. Cb# [phone]

## 2018-12-10 NOTE — Telephone Encounter (Signed)
Not true, I have not seen her since her  hospitalization so I could ot have given those orders.  If they have a discharge from the hospital that says to continue the medications?

## 2018-12-11 MED ORDER — DOXYCYCLINE HYCLATE 100 MG PO TABS: 100.0000 mg | ORAL_TABLET | Freq: Two times a day (BID) | ORAL | 5 refills | Status: AC

## 2018-12-11 NOTE — Telephone Encounter (Signed)
[facility] for continued use [facility].  She will need a Foley catheter change monthly and is due for change June 23,  So does the home health RN from [facility] have an order for that?

## 2018-12-11 NOTE — Telephone Encounter (Signed)
Spoke with sidney to let her know that this was not stated by Dr. Derrel Nip because she has not seen the pt since before she went to the hospital. I did look through her discharge summary and the doctor did say that he was sending her home on 2 weeks of Amoxicillin and would recommend "lifelong suppressive" treatment with doxycycline.

## 2018-12-11 NOTE — Telephone Encounter (Signed)
LMTCB. Please transfer Casimer Bilis to our office.

## 2018-12-11 NOTE — Addendum Note (Signed)
Addended by: [name] on: 12/11/2018 12:31 PM   Modules accepted: Orders

## 2018-12-11 NOTE — Telephone Encounter (Signed)
Spoke with Monica [name] to let her know that the [facility]xycycl[facility] has [facility]. The RN stated that she does change foley catheters and all she needed was a verbal order, so verbal was given for once monthly change with the 1st one being 12/15/2018.

## 2018-12-17 ENCOUNTER — Other Ambulatory Visit: Payer: Self-pay | Admitting: Internal Medicine

## 2018-12-30 ENCOUNTER — Other Ambulatory Visit: Payer: Self-pay | Admitting: Internal Medicine

## 2018-12-30 NOTE — Telephone Encounter (Signed)
Looks like the last two times this medication was filled it was filled by a different provider.

## 2018-12-31 ENCOUNTER — Other Ambulatory Visit: Payer: Self-pay | Admitting: Internal Medicine

## 2019-01-01 ENCOUNTER — Other Ambulatory Visit: Payer: Self-pay | Admitting: Internal Medicine

## 2019-01-20 ENCOUNTER — Telehealth: Payer: Self-pay | Admitting: Internal Medicine

## 2019-01-20 NOTE — Telephone Encounter (Signed)
Placed in red folder  

## 2019-01-20 NOTE — Telephone Encounter (Signed)
Death cert was dropped off. Its in color folder up front.

## 2019-01-23 DEATH — deceased

## 2020-09-22 IMAGING — MR MRI PELVIS WITHOUT AND WITH CONTRAST
5 of 8 series · 29 of 48 positions shown · IV contrast (Gadavist)
Comparison: MRI left hip dated July 20, 2018.

CLINICAL DATA: Sacral decubitus ulcer.

EXAM:
MRI PELVIS WITHOUT AND WITH CONTRAST
TECHNIQUE: Multiplanar multisequence MR imaging of the pelvis was performed
both before and after administration of intravenous contrast.
CONTRAST:  8 mm Gadavist intravenous contrast.

[Series 5: T1 · axial · 4.0mm · 0.74mm/px · z∈[-201,+54]mm · 6 of 52 slices shown (1 of 2)]
[im 1/52]
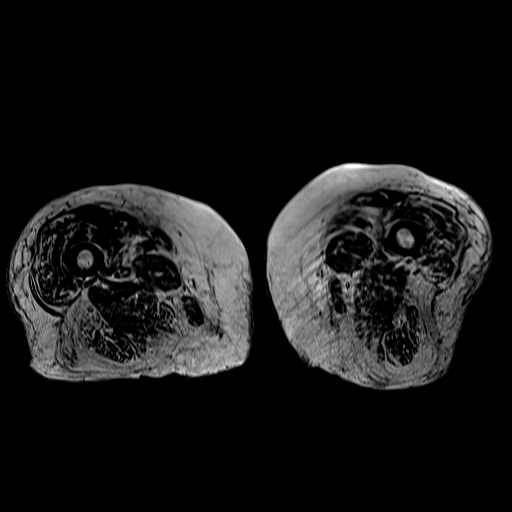
[im 11/52]
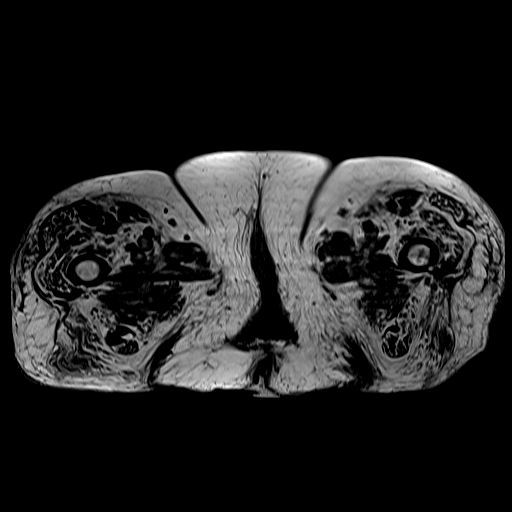
[im 21/52]
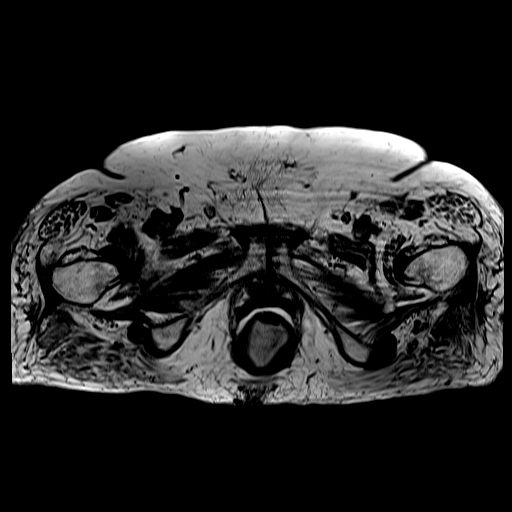
[im 31/52]
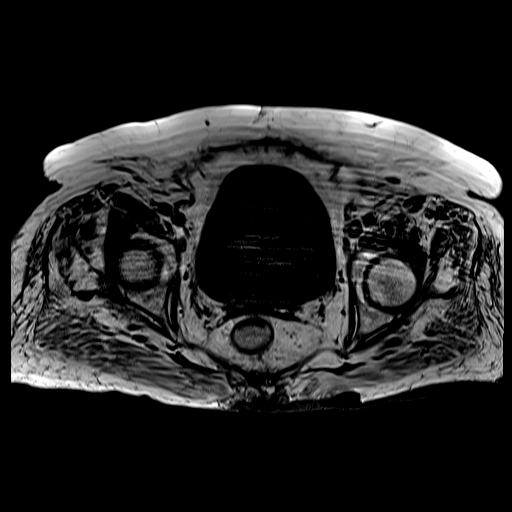
[im 41/52]
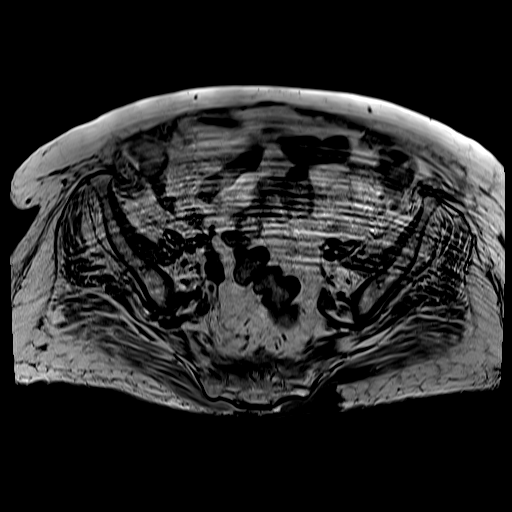
[im 52/52]
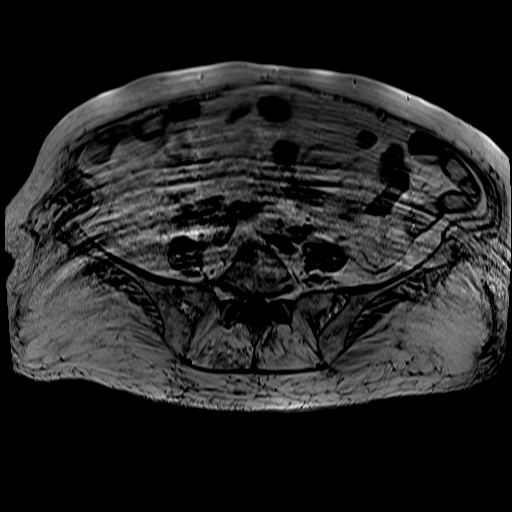

[Series 6: T2 fat-sat · axial · 4.0mm · 0.74mm/px · z∈[-201,+54]mm · 6 of 52 slices shown (1 of 2)]
[im 1/52]
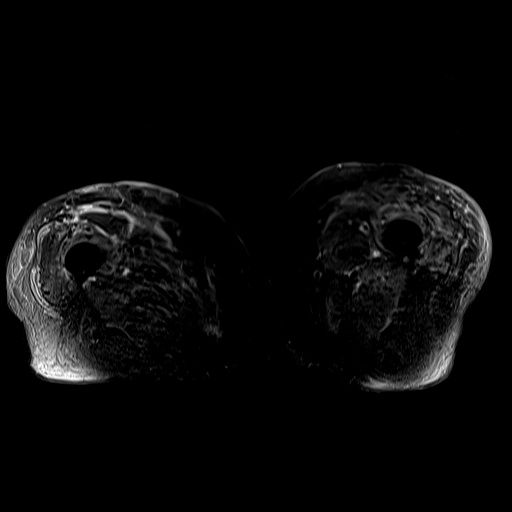
[im 11/52]
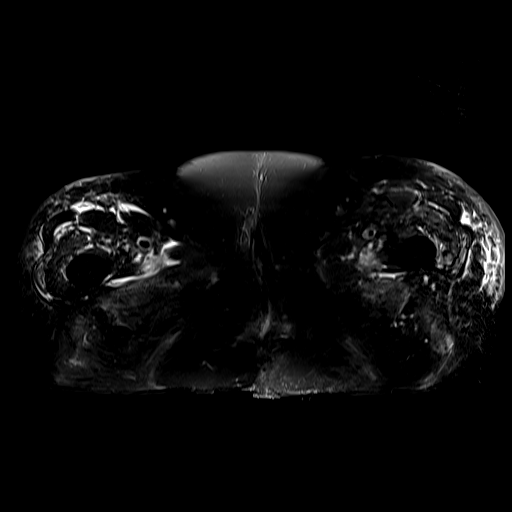
[im 21/52]
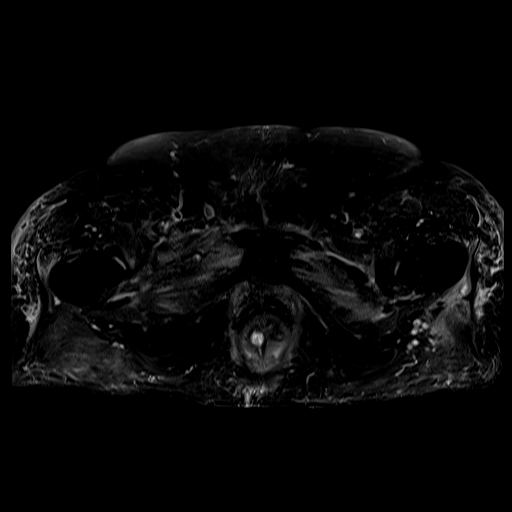
[im 31/52]
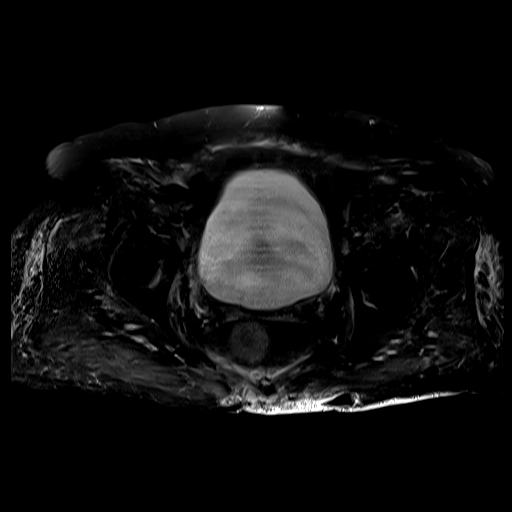
[im 41/52]
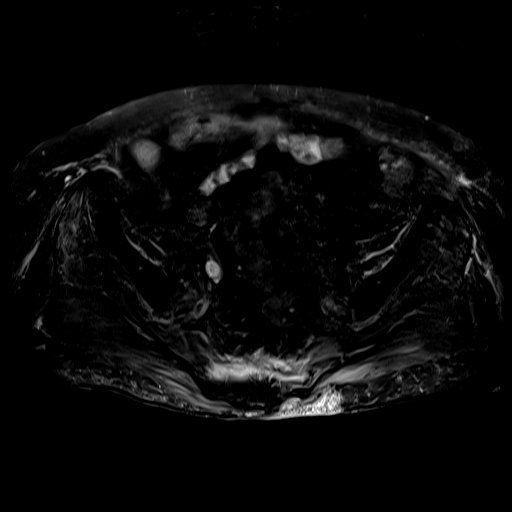
[im 52/52]
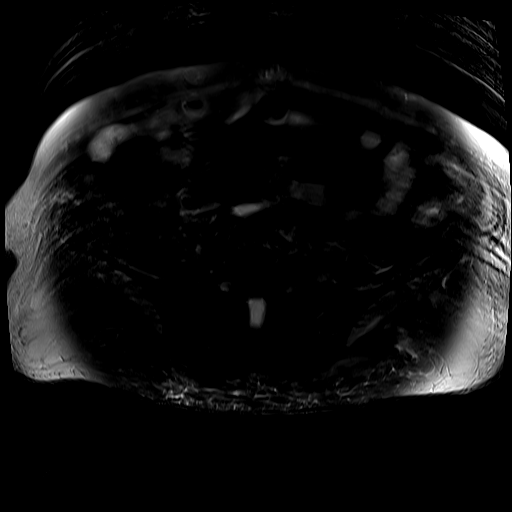

[Series 7: STIR · coronal · 4.0mm · 1.25mm/px · 5 of 45 slices shown]
[im 1/45]
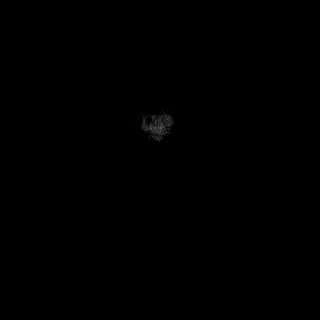
[im 12/45]
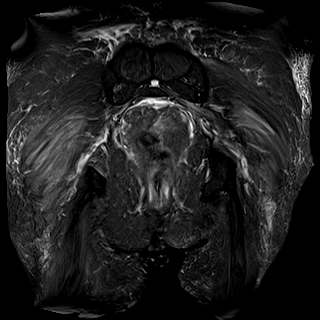
[im 23/45]
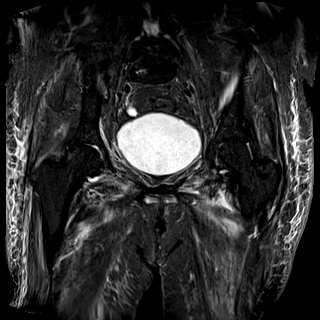
[im 34/45]
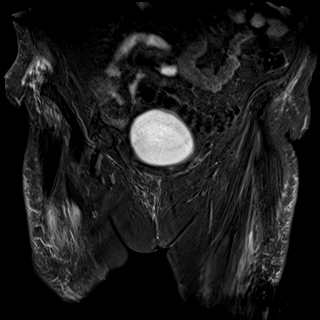
[im 45/45]
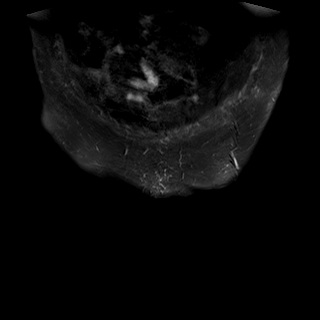

[Series 8: T1 · coronal · 4.0mm · 1.25mm/px · 5 of 40 slices shown (2 of 2)]
[im 1/40]
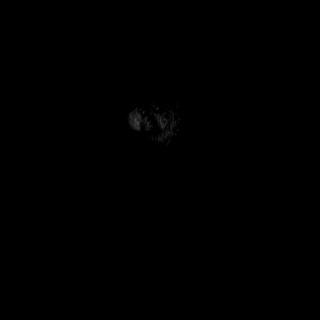
[im 10/40]
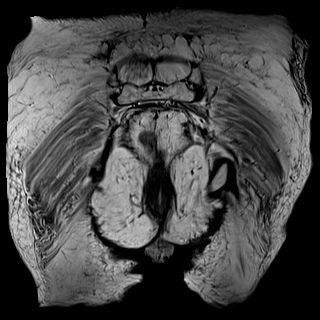
[im 20/40]
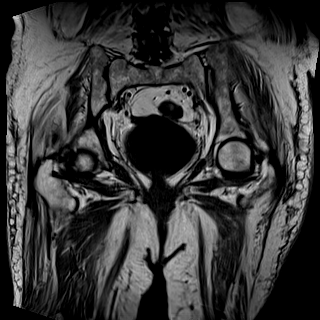
[im 30/40]
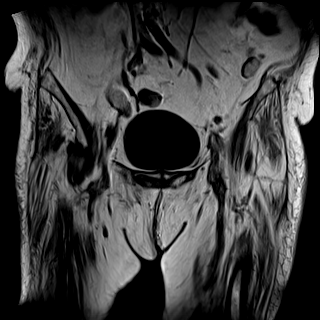
[im 40/40]
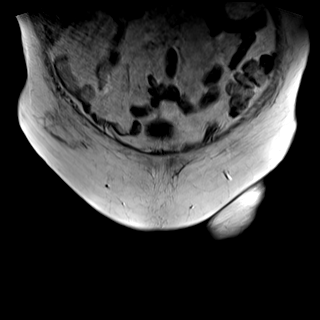

[Series 9: T2 fat-sat · sagittal · 4.0mm · 0.94mm/px · 7 of 72 slices shown (2 of 2)]
[im 1/72]
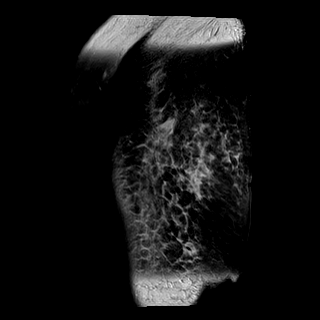
[im 9/72]
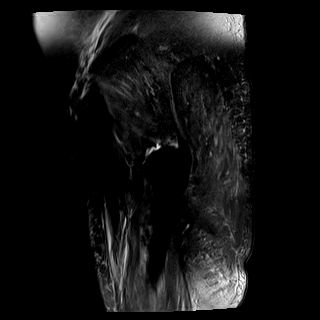
[im 18/72]
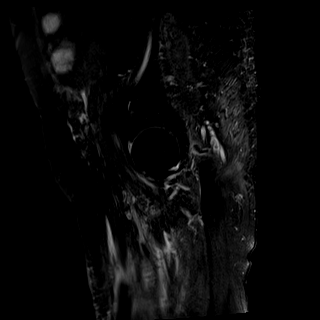
[im 27/72]
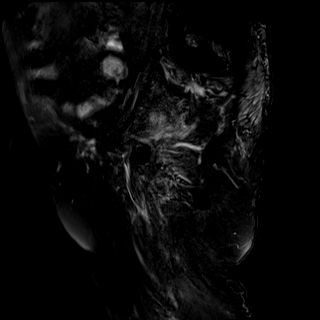
[im 45/72]
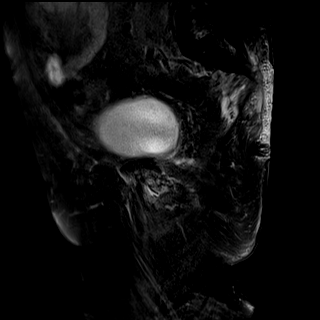
[im 54/72]
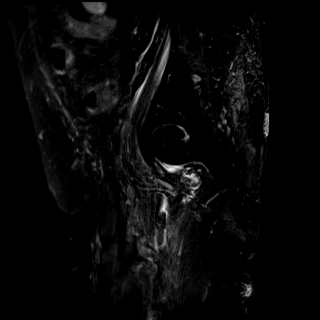
[im 63/72]
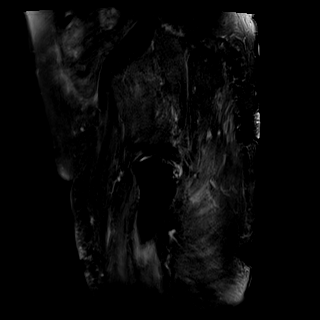

[29 of 48 positions shown; findings below may reference images not displayed]

FINDINGS: Urinary Tract:  No abnormality visualized.

Bowel:  Unremarkable visualized pelvic bowel loops.

Vascular/Lymphatic: No pathologically enlarged lymph nodes. No
significant vascular abnormality seen.

Reproductive:  Prior hysterectomy.  No adnexal mass.

Other:  Presacral edema.  No abscess.

Musculoskeletal: Large sacral decubitus ulcer eccentric to the left
with wound VAC in place. The wound extends to the sacrococcygeal
junction. There is mild marrow edema in the dorsal aspect of the
proximal coccyx with enhancement and indistinctness of the dorsal
cortex.

No acute fracture or dislocation. No joint effusion. Trace fluid in
the left greater than right greater trochanteric bursae. Unchanged
mild bilateral hip osteoarthritis. Mild-to-moderate diffuse muscle
atrophy.
IMPRESSION: 1. Large sacral decubitus ulcer extending to the sacrococcygeal
junction with early osteomyelitis of the proximal coccyx. No
abscess.
